# Patient Record
Sex: Female | Born: 1935 | Race: White | Hispanic: No | Smoking: Former smoker
Health system: Southern US, Community
[De-identification: ages and names within clinical notes are randomized; demographics above are authoritative.]

## PROBLEM LIST (undated history)

## (undated) DIAGNOSIS — I10 Essential (primary) hypertension: Secondary | ICD-10-CM

## (undated) DIAGNOSIS — E039 Hypothyroidism, unspecified: Secondary | ICD-10-CM

## (undated) DIAGNOSIS — M810 Age-related osteoporosis without current pathological fracture: Secondary | ICD-10-CM

## (undated) DIAGNOSIS — E7800 Pure hypercholesterolemia, unspecified: Secondary | ICD-10-CM

## (undated) HISTORY — DX: Pure hypercholesterolemia, unspecified: E78.00

## (undated) HISTORY — PX: APPENDECTOMY: SHX54

## (undated) HISTORY — DX: Hypothyroidism, unspecified: E03.9

## (undated) HISTORY — PX: VAGINAL HYSTERECTOMY: SUR661

## (undated) HISTORY — PX: CATARACT EXTRACTION: SUR2

## (undated) HISTORY — DX: Essential (primary) hypertension: I10

## (undated) HISTORY — PX: BACK SURGERY: SHX140

## (undated) HISTORY — DX: Age-related osteoporosis without current pathological fracture: M81.0

---

## 2003-05-07 ENCOUNTER — Ambulatory Visit (HOSPITAL_COMMUNITY): Admission: RE | Admit: 2003-05-07 | Discharge: 2003-05-08 | Payer: Self-pay | Admitting: Neurosurgery

## 2003-09-02 ENCOUNTER — Ambulatory Visit (HOSPITAL_COMMUNITY): Admission: RE | Admit: 2003-09-02 | Discharge: 2003-09-02 | Payer: Self-pay | Admitting: *Deleted

## 2016-10-09 ENCOUNTER — Telehealth: Payer: Self-pay

## 2016-10-09 NOTE — Telephone Encounter (Signed)
SENT NOTES TO SCHEDULING 

## 2016-10-14 NOTE — Progress Notes (Signed)
Cardiology Office Note   Date:  10/15/2016   ID:  Kristin Bryant, DOB [birth year], MRN [id number]  PCP:  Lawerance Cruel, MD  Cardiologist:   Minus Breeding, MD  Referring:  Lawerance Cruel, MD  Chief Complaint  Patient presents with  . Loss of Consciousness       History of Present Illness: Kristin Bryant is a 83 y.o. female who presents for evaluation of weakness and syncope.  The patient has had no prior cardiac history. She reports 2 or 3 weeks ago he was nauseated. She says she was given a medicine for "weight gain" and that this she thought might have caused those symptoms but I don't know what that medicine was. She's not taking it. Regardless she had some nausea and vomiting and when she was going into the bathroom she went down to [place] and had a frank syncopal episode while leaning over the toilet. She came to when she hit her head. She apparently did not seek medical attention. She otherwise has done okay. She is very active for her age. She works in her yard and drives and tends her house including vacuuming. She denies any neck or arm discomfort. She's not had any palpitations. There is some mild occasional orthostatic symptoms.  He denies any nausea vomiting other than the episode mentioned. He doesn't have any significant shortness of breath, PND or orthopnea. She does have lots of burping and gas that she associates with certain foods. She's had indigestion with this which has been a chronic issue. She seems to know which foods exacerbate this.   Past Medical History:  Diagnosis Date  . Elevated cholesterol   . Hypertension   . Hypothyroidism   . Osteoporosis     Past Surgical History:  Procedure Laterality Date  . APPENDECTOMY    . BACK SURGERY    . CATARACT EXTRACTION    . VAGINAL HYSTERECTOMY       Current Outpatient Prescriptions  Medication Sig Dispense Refill  . acetaminophen (TYLENOL) 650 MG CR tablet Take 650 mg by mouth every 8 (eight) hours as  needed for pain.    Marland Kitchen atorvastatin (LIPITOR) 40 MG tablet Take 40 mg by mouth daily.    . benazepril (LOTENSIN) 20 MG tablet Take 20 mg by mouth daily.    . Calcium Citrate (CITRACAL PO) Take 2 tablets by mouth daily.    . Cholecalciferol (VITAMIN D3) 5000 units CAPS Take 1 tablet by mouth daily.    . DiphenhydrAMINE HCl (BENADRYL ALLERGY PO) Take 1 tablet by mouth as directed.    . hydrocortisone 2.5 % cream Apply 1 application topically 2 (two) times daily.    Marland Kitchen levothyroxine (SYNTHROID, LEVOTHROID) 100 MCG tablet Take 100 mcg by mouth daily before breakfast.    . Melatonin 3 MG TABS Take 1 tablet by mouth at bedtime.    . Multiple Vitamins-Minerals (CENTRUM SILVER 50+WOMEN PO) Take 1 tablet by mouth daily.    . Multiple Vitamins-Minerals (PRESERVISION AREDS 2 PO) Take 1 capsule by mouth daily.    . Polyethylene Glycol 3350-GRX POWD Take by mouth. Mixed in liquid orally once a day as needed for constipation     No current facility-administered medications for this visit.     Allergies:   Arthrotec [diclofenac-misoprostol]; Aspirin; Benzonatate; Celexa [citalopram]; Guaifenesin & derivatives; Hydrocodone; Lily of the valley herb [convallariae majalis]; Mirtazapine; Naprosyn [naproxen]; Parafon forte dsc [chlorzoxazone]; Ranitidine; Sudafed [pseudoephedrine hcl]; Sulfa antibiotics; Epinephrine; and Latex  Social History:  The patient  reports that she has quit smoking. Her smoking use included Cigarettes. She has never used smokeless tobacco.   Family History:  The patient's family history includes Breast cancer in her sister; Diabetes in her son; Prostate cancer in her brother; Stomach cancer in her father.    ROS:  Please see the history of present illness.   Otherwise, review of systems are positive for tingling in her feet.   All other systems are reviewed and negative.    PHYSICAL EXAM: VS:  BP 134/60   Pulse 74   Ht 5\' 3"  (1.6 m)   Wt 137 lb 9.6 oz (62.4 kg)   BMI 24.37 kg/m   , BMI Body mass index is 24.37 kg/m. GENERAL:  Well appearing HEENT:  Pupils equal round and reactive, fundi not visualized, oral mucosa unremarkable NECK:  No jugular venous distention, waveform within normal limits, carotid upstroke brisk and symmetric, no bruits, no thyromegaly LYMPHATICS:  No cervical, inguinal adenopathy LUNGS:  Clear to auscultation bilaterally BACK:  No CVA tenderness CHEST:  Unremarkable HEART:  PMI not displaced or sustained,S1 and S2 within normal limits, no S3, no S4, no clicks, no rubs, no murmurs ABD:  Flat, positive bowel sounds normal in frequency in pitch, no bruits, no rebound, no guarding, no midline pulsatile mass, no hepatomegaly, no splenomegaly EXT:  2 plus pulses throughout, no edema, no cyanosis no clubbing SKIN:  No rashes no nodules NEURO:  Cranial nerves II through XII grossly intact, motor grossly intact throughout PSYCH:  Cognitively intact, oriented to person place and time    EKG:  EKG is ordered today. The ekg ordered today demonstrates sinus rhythm, rate 74, left axis deviation, low voltage in the limb and chest leads, could not exclude old inferior infarct   Recent Labs: No results found for requested labs within last 8760 hours.    Lipid Panel No results found for: CHOL, TRIG, HDL, CHOLHDL, VLDL, LDLCALC, LDLDIRECT    Wt Readings from Last 3 Encounters:  10/15/16 137 lb 9.6 oz (62.4 kg)      Other studies Reviewed: Additional studies/ records that were reviewed today include: Office records. Review of the above records demonstrates:  Please see elsewhere in the note.     ASSESSMENT AND PLAN:  FATIGUE:  She actually reports she thinks she feels fairly good for her age. She rests when she does work but she can complete her tasks without profound limits. She's had no acute change in this. At this point I did review some labs and she was not found to have an abnormal thyroid recently. I don't know her recent CBC. She does have  some sleep problems. However, I don't see a clear cardiac etiology to this complaint.  SYNCOPE:   This seems like it was related to the nausea vomiting episode she's had no recurrence of this. No further workup is planned.  CHEST PAIN:  She's had some chronic indigestion and has had no change to this pattern. She certainly can associate this clearly with certain foods.  I do not suspect an anginal etiology but would encourage first avoiding the foods and found her and possibly following with GI. I would certainly be happy to reevaluate this if there was not thought to be a GI cause.  ABNORMAL EKG:   She does have a slightly abnormal EKG with low voltage. However, there are no other acute findings and I don't see  an old EKG for comparison. I'll  try to obtain one.   Current medicines are reviewed at length with the patient today.  The patient does not have concerns regarding medicines.  The following changes have been made:  no change  Labs/ tests ordered today include: None No orders of the defined types were placed in this encounter.    Disposition:   FU with me as needed.     Signed, Minus Breeding, MD  10/15/2016 1:03 PM    [facility]

## 2016-10-15 ENCOUNTER — Ambulatory Visit (INDEPENDENT_AMBULATORY_CARE_PROVIDER_SITE_OTHER): Payer: Medicare Other | Admitting: Cardiology

## 2016-10-15 ENCOUNTER — Encounter (INDEPENDENT_AMBULATORY_CARE_PROVIDER_SITE_OTHER): Payer: Self-pay

## 2016-10-15 ENCOUNTER — Encounter: Payer: Self-pay | Admitting: Cardiology

## 2016-10-15 VITALS — BP 134/60 | HR 74 | Ht 63.0 in | Wt 137.6 lb

## 2016-10-15 DIAGNOSIS — R5383 Other fatigue: Secondary | ICD-10-CM | POA: Diagnosis not present

## 2016-10-15 DIAGNOSIS — R079 Chest pain, unspecified: Secondary | ICD-10-CM | POA: Insufficient documentation

## 2016-10-15 DIAGNOSIS — R55 Syncope and collapse: Secondary | ICD-10-CM

## 2016-10-15 DIAGNOSIS — R9431 Abnormal electrocardiogram [ECG] [EKG]: Secondary | ICD-10-CM

## 2016-10-15 NOTE — Patient Instructions (Signed)
Medication Instructions:  Continue current medications  Labwork: None Ordered  Testing/Procedures: None Ordered  Follow-Up: Your physician recommends that you schedule a follow-up appointment in: As Needed   Any Other Special Instructions Will Be Listed Below (If Applicable).   If you need a refill on your cardiac medications before your next appointment, please call your pharmacy.   

## 2016-10-16 NOTE — Addendum Note (Signed)
Addended by: Zebedee Iba on: 10/16/2016 11:40 AM   Modules accepted: Orders

## 2018-12-05 ENCOUNTER — Other Ambulatory Visit: Payer: Self-pay | Admitting: Family Medicine

## 2018-12-05 DIAGNOSIS — N6310 Unspecified lump in the right breast, unspecified quadrant: Secondary | ICD-10-CM

## 2018-12-09 DIAGNOSIS — C50919 Malignant neoplasm of unspecified site of unspecified female breast: Secondary | ICD-10-CM

## 2018-12-09 HISTORY — DX: Malignant neoplasm of unspecified site of unspecified female breast: C50.919

## 2018-12-11 ENCOUNTER — Other Ambulatory Visit: Payer: Medicare Other

## 2018-12-17 ENCOUNTER — Other Ambulatory Visit: Payer: Medicare Other

## 2019-01-19 ENCOUNTER — Telehealth: Payer: Self-pay | Admitting: Hematology and Oncology

## 2019-01-19 NOTE — Telephone Encounter (Signed)
Spoke with patient's dtr in law Wells Guiles [phone]) re new patient appointment with Dr. Lindi Adie. Date/time/[place] per navigator.

## 2019-01-20 NOTE — Telephone Encounter (Signed)
Per navigator rescheduled appointment from 10/23 to 10/15. Confirmed with patient son new date/time and also that he or wife attend appointment with patient per provider/navigator request. Per son wife will attend. Added comments to appointment note.

## 2019-01-21 NOTE — Progress Notes (Signed)
[facility] CONSULT NOTE  Patient Care Team: Lawerance Cruel, MD as PCP - General (Family Medicine)  CHIEF COMPLAINTS/PURPOSE OF CONSULTATION:  Newly diagnosed breast cancer  HISTORY OF PRESENTING ILLNESS:  Kristin Bryant 85 y.o. female is here because of recent diagnosis of invasive mammary carcinoma of the right breast. The patient palpated a lump in the right breast and noticed skin dimpling. Diagnostic mammogram on 12/25/18 showed a 3cm distortion in the right breast at the 7 o'clock position. Right breast biopsy on 01/07/19 showed invasive mammary carcinoma, grade 1, HER-2 negative (1+), ER+ 90%, PR- 0%, Ki67 20%. She presents to the clinic today for initial evaluation and discussion of treatment options.   I reviewed her records extensively and collaborated the history with the patient.  SUMMARY OF ONCOLOGIC HISTORY: Oncology History  Malignant neoplasm of lower-outer quadrant of right breast of female, estrogen receptor positive ([place])  01/07/2019 Initial Diagnosis   patient palpated a lump in the right breast and noticed skin dimpling. Diagnostic mammogram on 12/25/18 showed a 3cm distortion in the right breast at the 7 o'clock position. Right breast biopsy on 01/07/19 showed invasive mammary carcinoma, grade 1, HER-2 negative (1+), ER+ 90%, PR- 0%, Ki67 20%   01/22/2019 Cancer Staging   Staging form: Breast, AJCC 8th Edition - Clinical: Stage IIA (cT2, cN0, cM0, G1, ER+, PR-, HER2-) - Signed by Nicholas Lose, MD on 01/22/2019     MEDICAL HISTORY:  Past Medical History:  Diagnosis Date  . Elevated cholesterol   . Hypertension   . Hypothyroidism   . Osteoporosis     SURGICAL HISTORY: Appendectomy, oral surgery SOCIAL HISTORY: Moderate alcohol use, denies any current tobacco use.  Former smoker. FAMILY HISTORY: Family History  Problem Relation Age of Onset  . Stomach cancer Father   . Breast cancer Sister   . Prostate cancer Brother   . Diabetes Son      ALLERGIES:  is allergic to arthrotec [diclofenac-misoprostol]; aspirin; benzonatate; celexa [citalopram]; guaifenesin & derivatives; hydrocodone; lily of the valley herb [convallariae majalis]; mirtazapine; naprosyn [naproxen]; parafon forte dsc [chlorzoxazone]; ranitidine; sudafed [pseudoephedrine hcl]; sulfa antibiotics; epinephrine; and latex.  MEDICATIONS:  Current Outpatient Medications  Medication Sig Dispense Refill  . acetaminophen (TYLENOL) 650 MG CR tablet Take 650 mg by mouth every 8 (eight) hours as needed for pain.    Marland Kitchen anastrozole (ARIMIDEX) 1 MG tablet Take 1 tablet (1 mg total) by mouth daily. 90 tablet 3  . atorvastatin (LIPITOR) 40 MG tablet Take 40 mg by mouth daily.    . benazepril (LOTENSIN) 20 MG tablet Take 20 mg by mouth daily.    . Calcium Citrate (CITRACAL PO) Take 2 tablets by mouth daily.    . Cholecalciferol (VITAMIN D3) 5000 units CAPS Take 1 tablet by mouth daily.    . DiphenhydrAMINE HCl (BENADRYL ALLERGY PO) Take 1 tablet by mouth as directed.    Marland Kitchen levothyroxine (SYNTHROID, LEVOTHROID) 100 MCG tablet Take 100 mcg by mouth daily before breakfast.    . Multiple Vitamins-Minerals (CENTRUM SILVER 50+WOMEN PO) Take 1 tablet by mouth daily.    . Multiple Vitamins-Minerals (PRESERVISION AREDS 2 PO) Take 1 capsule by mouth daily.    . Polyethylene Glycol 3350-GRX POWD Take by mouth. Mixed in liquid orally once a day as needed for constipation     No current facility-administered medications for this visit.     REVIEW OF SYSTEMS:   Constitutional: Denies fevers, chills or abnormal night sweats Eyes: Denies blurriness of  vision, double vision or watery eyes Ears, nose, mouth, throat, and face: Denies mucositis or sore throat Respiratory: Denies cough, dyspnea or wheezes Cardiovascular: Denies palpitation, chest discomfort or lower extremity swelling Gastrointestinal:  Denies nausea, heartburn or change in bowel habits Skin: Denies abnormal skin rashes Lymphatics:  Denies new lymphadenopathy or easy bruising Neurological:Denies numbness, tingling or new weaknesses Behavioral/Psych: Mood is stable, no new changes  Breast: Right breast mass and skin dimpling All other systems were reviewed with the patient and are negative.  PHYSICAL EXAMINATION: ECOG PERFORMANCE STATUS: 1 - Symptomatic but completely ambulatory  Vitals:   01/22/19 1614  BP: (!) 143/67  Pulse: 85  Resp: 17  Temp: 98.5 F (36.9 C)  SpO2: 99%   Filed Weights   01/22/19 1614  Weight: 136 lb 6.4 oz (61.9 kg)    GENERAL:alert, no distress and comfortable SKIN: skin color, texture, turgor are normal, no rashes or significant lesions EYES: normal, conjunctiva are pink and non-injected, sclera clear OROPHARYNX:no exudate, no erythema and lips, buccal mucosa, and tongue normal  NECK: supple, thyroid normal size, non-tender, without nodularity LYMPH:  no palpable lymphadenopathy in the cervical, axillary or inguinal LUNGS: clear to auscultation and percussion with normal breathing effort HEART: regular rate & rhythm and no murmurs and no lower extremity edema ABDOMEN:abdomen soft, non-tender and normal bowel sounds Musculoskeletal:no cyanosis of digits and no clubbing  PSYCH: alert & oriented x 3 with fluent speech NEURO: no focal motor/sensory deficits BREAST: Large right breast palpable lump. No palpable axillary or supraclavicular lymphadenopathy (exam performed in the presence of a chaperone)   RADIOGRAPHIC STUDIES: I have personally reviewed the radiological reports and agreed with the findings in the report.  ASSESSMENT AND PLAN:  Malignant neoplasm of lower-outer quadrant of right breast of female, estrogen receptor positive ([place]) 01/07/2019:patient palpated a lump in the right breast and noticed skin dimpling. Diagnostic mammogram on 12/25/18 showed a 3cm distortion in the right breast at the 7 o'clock position. Right breast biopsy on 01/07/19 showed invasive mammary  carcinoma, grade 1, HER-2 negative (1+), ER+ 90%, PR- 0%, Ki67 20% T2N0 stage IIa clinical stage  Pathology and radiology counseling: Discussed with the patient, the details of pathology including the type of breast cancer,the clinical staging, the significance of ER, PR and HER-2/neu receptors and the implications for treatment. After reviewing the pathology in detail, we proceeded to discuss the different treatment options between surgery, and antiestrogen therapies.  Recommendation: 1.  Patient met with Dr. Donne Hazel who recommended conservative management with antiestrogen therapy. 2.  I counseled her about pros and cons of antiestrogen therapy with anastrozole.  We discussed the risks and benefits of anti-estrogen therapy with aromatase inhibitors. These include but not limited to insomnia, hot flashes, mood changes, vaginal dryness, bone density loss, and weight gain. We strongly believe that the benefits far outweigh the risks. Patient understands these risks and consented to starting treatment.   Patient understands of the goal of treatment is palliation.  It is not going to cure her cancer.  She will get mammograms every 6 months.  If it starts to grow then surgery can be an option. Patient is extremely physically agile and independent and still cares for herself and lives alone. Return to clinic in 3 months for toxicity evaluation follow-up.   All questions were answered. The patient knows to call the clinic with any problems, questions or concerns.   Rulon Eisenmenger, MD, MPH 01/22/2019    I, Molly Dorshimer, am acting as  scribe for Nicholas Lose, MD.  I have reviewed the above documentation for accuracy and completeness, and I agree with the above.

## 2019-01-22 ENCOUNTER — Inpatient Hospital Stay: Payer: Medicare Other | Attending: Hematology and Oncology | Admitting: Hematology and Oncology

## 2019-01-22 ENCOUNTER — Other Ambulatory Visit: Payer: Self-pay

## 2019-01-22 DIAGNOSIS — Z800 Family history of malignant neoplasm of digestive organs: Secondary | ICD-10-CM | POA: Diagnosis not present

## 2019-01-22 DIAGNOSIS — Z803 Family history of malignant neoplasm of breast: Secondary | ICD-10-CM | POA: Insufficient documentation

## 2019-01-22 DIAGNOSIS — Z170 Estrogen receptor positive status [ER+]: Secondary | ICD-10-CM | POA: Diagnosis not present

## 2019-01-22 DIAGNOSIS — I10 Essential (primary) hypertension: Secondary | ICD-10-CM | POA: Diagnosis not present

## 2019-01-22 DIAGNOSIS — E039 Hypothyroidism, unspecified: Secondary | ICD-10-CM | POA: Insufficient documentation

## 2019-01-22 DIAGNOSIS — Z87891 Personal history of nicotine dependence: Secondary | ICD-10-CM | POA: Insufficient documentation

## 2019-01-22 DIAGNOSIS — E7800 Pure hypercholesterolemia, unspecified: Secondary | ICD-10-CM | POA: Insufficient documentation

## 2019-01-22 DIAGNOSIS — C50511 Malignant neoplasm of lower-outer quadrant of right female breast: Secondary | ICD-10-CM | POA: Diagnosis not present

## 2019-01-22 DIAGNOSIS — Z79899 Other long term (current) drug therapy: Secondary | ICD-10-CM | POA: Insufficient documentation

## 2019-01-22 DIAGNOSIS — Z8042 Family history of malignant neoplasm of prostate: Secondary | ICD-10-CM | POA: Insufficient documentation

## 2019-01-22 MED ORDER — ANASTROZOLE 1 MG PO TABS: 1.0000 mg | ORAL_TABLET | Freq: Every day | ORAL | 3 refills | Status: DC

## 2019-01-22 NOTE — Assessment & Plan Note (Addendum)
01/07/2019:patient palpated a lump in the right breast and noticed skin dimpling. Diagnostic mammogram on 12/25/18 showed a 3cm distortion in the right breast at the 7 o'clock position. Right breast biopsy on 01/07/19 showed invasive mammary carcinoma, grade 1, HER-2 negative (1+), ER+ 90%, PR- 0%, Ki67 20% T2N0 stage IIa clinical stage  Pathology and radiology counseling: Discussed with the patient, the details of pathology including the type of breast cancer,the clinical staging, the significance of ER, PR and HER-2/neu receptors and the implications for treatment. After reviewing the pathology in detail, we proceeded to discuss the different treatment options between surgery, and antiestrogen therapies.  Recommendation: 1.  Patient met with Dr. Donne Hazel who recommended conservative management with antiestrogen therapy. 2.  I counseled her about pros and cons of antiestrogen therapy with anastrozole.  We discussed the risks and benefits of anti-estrogen therapy with aromatase inhibitors. These include but not limited to insomnia, hot flashes, mood changes, vaginal dryness, bone density loss, and weight gain. We strongly believe that the benefits far outweigh the risks. Patient understands these risks and consented to starting treatment.   Return to clinic in 3 months for toxicity evaluation follow-up.

## 2019-01-23 ENCOUNTER — Telehealth: Payer: Self-pay | Admitting: Hematology and Oncology

## 2019-01-23 NOTE — Telephone Encounter (Signed)
I left a message regarding schedule  

## 2019-01-30 ENCOUNTER — Ambulatory Visit: Payer: Medicare Other | Admitting: Hematology and Oncology

## 2019-04-23 NOTE — Progress Notes (Signed)
Patient Care Team: Lawerance Cruel, MD as PCP - General (Family Medicine)  DIAGNOSIS:    ICD-10-CM   1. Malignant neoplasm of lower-outer quadrant of right breast of female, estrogen receptor positive ([place])  C50.511 US BREAST LTD UNI RIGHT INC AXILLA   Z17.0 MM DIAG BREAST TOMO UNI RIGHT    SUMMARY OF ONCOLOGIC HISTORY: Oncology History  Malignant neoplasm of lower-outer quadrant of right breast of female, estrogen receptor positive ([place])  01/07/2019 Initial Diagnosis   patient palpated a lump in the right breast and noticed skin dimpling. Diagnostic mammogram on 12/25/18 showed a 3cm distortion in the right breast at the 7 o'clock position. Right breast biopsy on 01/07/19 showed invasive mammary carcinoma, grade 1, HER-2 negative (1+), ER+ 90%, PR- 0%, Ki67 20%   01/22/2019 Cancer Staging   Staging form: Breast, AJCC 8th Edition - Clinical: Stage IIA (cT2, cN0, cM0, G1, ER+, PR-, HER2-) - Signed by Nicholas Lose, MD on 01/22/2019   01/22/2019 -  Neo-Adjuvant Anti-estrogen oral therapy   Anastrozole '1mg'$  daily     CHIEF COMPLIANT: Follow-up of right breast cancer on anastrozole  INTERVAL HISTORY: Kristin Bryant is a 86 y.o. with above-mentioned history of right breast cancer currently on neoadjuvant antiestrogen therapy with anastrozole. She presents to the clinic today for follow-up.   ALLERGIES:  is allergic to arthrotec [diclofenac-misoprostol]; aspirin; benzonatate; celexa [citalopram]; guaifenesin & derivatives; hydrocodone; lily of the valley herb [convallariae majalis]; mirtazapine; naprosyn [naproxen]; parafon forte dsc [chlorzoxazone]; ranitidine; sudafed [pseudoephedrine hcl]; sulfa antibiotics; epinephrine; and latex.  MEDICATIONS:  Current Outpatient Medications  Medication Sig Dispense Refill  . acetaminophen (TYLENOL) 650 MG CR tablet Take 650 mg by mouth every 8 (eight) hours as needed for pain.    Marland Kitchen anastrozole (ARIMIDEX) 1 MG tablet Take 1 tablet (1 mg total) by  mouth daily. 90 tablet 3  . atorvastatin (LIPITOR) 40 MG tablet Take 40 mg by mouth daily.    . benazepril (LOTENSIN) 20 MG tablet Take 20 mg by mouth daily.    . Calcium Citrate (CITRACAL PO) Take 2 tablets by mouth daily.    . Cholecalciferol (VITAMIN D3) 5000 units CAPS Take 1 tablet by mouth daily.    . DiphenhydrAMINE HCl (BENADRYL ALLERGY PO) Take 1 tablet by mouth as directed.    Marland Kitchen levothyroxine (SYNTHROID, LEVOTHROID) 100 MCG tablet Take 100 mcg by mouth daily before breakfast.    . Multiple Vitamins-Minerals (CENTRUM SILVER 50+WOMEN PO) Take 1 tablet by mouth daily.    . Multiple Vitamins-Minerals (PRESERVISION AREDS 2 PO) Take 1 capsule by mouth daily.    . Polyethylene Glycol 3350-GRX POWD Take by mouth. Mixed in liquid orally once a day as needed for constipation     No current facility-administered medications for this visit.    PHYSICAL EXAMINATION: ECOG PERFORMANCE STATUS: 1 - Symptomatic but completely ambulatory  Vitals:   04/24/19 1135  BP: (!) 160/68  Pulse: 68  Resp: 18  Temp: 98.3 F (36.8 C)  SpO2: 100%   Filed Weights   04/24/19 1135  Weight: 139 lb 11.2 oz (63.4 kg)   ASSESSMENT & PLAN:  Malignant neoplasm of lower-outer quadrant of right breast of female, estrogen receptor positive ([place]) 01/07/2019:patient palpated a lump in the right breast and noticed skin dimpling. Diagnostic mammogram on 12/25/18 showed a 3cm distortion in the right breast at the 7 o'clock position. Right breast biopsy on 01/07/19 showed invasive mammary carcinoma, grade 1, HER-2 negative (1+), ER+ 90%, PR- 0%, Ki67 20%  T2N0 stage IIa clinical stage  Current treatment: Palliative antiestrogen therapy with anastrozole 1 mg daily started 01/22/2019 Anastrozole toxicities: Tolerating it well  Plan is to obtain mammograms in 6 months Patient is extremely physically agile and independent and still cares for herself and lives alone.  Return to clinic in 6 months for follow-up after  mammograms    Orders Placed This Encounter  Procedures  . US BREAST LTD UNI RIGHT INC AXILLA    Standing Status:   Future    Standing Expiration Date:   06/21/2020    Order Specific Question:   Reason for Exam (SYMPTOM  OR DIAGNOSIS REQUIRED)    Answer:   Folow up of breast cancer on hormonal therapy    Order Specific Question:   Preferred imaging location?    Answer:   [facility]  . MM DIAG BREAST TOMO UNI RIGHT    Standing Status:   Future    Standing Expiration Date:   04/23/2020    Order Specific Question:   Reason for Exam (SYMPTOM  OR DIAGNOSIS REQUIRED)    Answer:   Follow up of breast cancer    Order Specific Question:   Preferred imaging location?    Answer:   [facility]   The patient has a good understanding of the overall plan. she agrees with it. she will call with any problems that may develop before the next visit here.  Total time spent: 15 mins including face to face time and time spent for planning, charting and coordination of care  Nicholas Lose, MD 04/24/2019  I, Cloyde Reams Dorshimer, am acting as scribe for Dr. Nicholas Lose.  I have reviewed the above documentation for accuracy and completeness, and I agree with the above.

## 2019-04-24 ENCOUNTER — Inpatient Hospital Stay: Payer: Medicare Other | Attending: Hematology and Oncology | Admitting: Hematology and Oncology

## 2019-04-24 ENCOUNTER — Other Ambulatory Visit: Payer: Self-pay

## 2019-04-24 DIAGNOSIS — Z79899 Other long term (current) drug therapy: Secondary | ICD-10-CM | POA: Insufficient documentation

## 2019-04-24 DIAGNOSIS — C50511 Malignant neoplasm of lower-outer quadrant of right female breast: Secondary | ICD-10-CM | POA: Diagnosis not present

## 2019-04-24 DIAGNOSIS — Z79811 Long term (current) use of aromatase inhibitors: Secondary | ICD-10-CM | POA: Insufficient documentation

## 2019-04-24 DIAGNOSIS — Z170 Estrogen receptor positive status [ER+]: Secondary | ICD-10-CM

## 2019-04-24 NOTE — Assessment & Plan Note (Signed)
01/07/2019:patient palpated a lump in the right breast and noticed skin dimpling. Diagnostic mammogram on 12/25/18 showed a 3cm distortion in the right breast at the 7 o'clock position. Right breast biopsy on 01/07/19 showed invasive mammary carcinoma, grade 1, HER-2 negative (1+), ER+ 90%, PR- 0%, Ki67 20% T2N0 stage IIa clinical stage  Current treatment: Palliative antiestrogen therapy with anastrozole 1 mg daily started 01/22/2019 Anastrozole toxicities:  Plan is to obtain mammograms annually. Patient is extremely physically agile and independent and still cares for herself and lives alone.  Return to clinic in 6 months for follow-up

## 2019-04-27 ENCOUNTER — Telehealth: Payer: Self-pay | Admitting: Hematology and Oncology

## 2019-04-27 NOTE — Telephone Encounter (Signed)
I could not reach patient regarding schedule  °

## 2019-05-24 ENCOUNTER — Ambulatory Visit: Payer: Medicare Other | Attending: Internal Medicine

## 2019-05-24 DIAGNOSIS — Z23 Encounter for immunization: Secondary | ICD-10-CM

## 2019-05-24 NOTE — Progress Notes (Signed)
   Covid-19 Vaccination Clinic  Name:  Kristin Bryant    MRN: [id number] DOB: 1922/05/19  05/24/2019  [name] was observed post Covid-19 immunization for 30 minutes based on pre-vaccination screening without incidence. She was provided with Vaccine Information Sheet and instruction to access the V-Safe system.   [name] was instructed to call 911 with any severe reactions post vaccine: Marland Kitchen Difficulty breathing  . Swelling of your face and throat  . A fast heartbeat  . A bad rash all over your body  . Dizziness and weakness    Immunizations Administered    Name Date Dose VIS Date Route   Pfizer COVID-19 Vaccine 05/24/2019  1:44 PM 0.3 mL 03/20/2019 Intramuscular   Manufacturer: [place]   Lot: [id number]   [place]: [id number]

## 2019-06-16 ENCOUNTER — Ambulatory Visit: Payer: Medicare Other | Attending: Internal Medicine

## 2019-06-16 DIAGNOSIS — Z23 Encounter for immunization: Secondary | ICD-10-CM | POA: Insufficient documentation

## 2019-06-16 NOTE — Progress Notes (Signed)
   Covid-19 Vaccination Clinic  Name:  Kristin Bryant    MRN: [id number] DOB: [birth year]  06/16/2019  [name] was observed post Covid-19 immunization for 15 minutes without incident. She was provided with Vaccine Information Sheet and instruction to access the V-Safe system.   [name] was instructed to call 911 with any severe reactions post vaccine: Marland Kitchen Difficulty breathing  . Swelling of face and throat  . A fast heartbeat  . A bad rash all over body  . Dizziness and weakness   Immunizations Administered    Name Date Dose VIS Date Route   Pfizer COVID-19 Vaccine 06/16/2019  2:00 PM 0.3 mL 03/20/2019 Intramuscular   Manufacturer: [place]   Lot: [id number]   [place]: [id number]

## 2019-06-17 ENCOUNTER — Ambulatory Visit: Payer: Medicare Other

## 2019-10-22 ENCOUNTER — Ambulatory Visit
Admission: RE | Admit: 2019-10-22 | Discharge: 2019-10-22 | Disposition: A | Discharge status: Home / Self Care | Payer: Medicare Other | Source: Ambulatory Visit | Attending: Hematology and Oncology | Admitting: Hematology and Oncology

## 2019-10-22 ENCOUNTER — Other Ambulatory Visit: Payer: Self-pay

## 2019-10-22 DIAGNOSIS — C50511 Malignant neoplasm of lower-outer quadrant of right female breast: Secondary | ICD-10-CM

## 2019-10-22 DIAGNOSIS — Z170 Estrogen receptor positive status [ER+]: Secondary | ICD-10-CM

## 2019-10-25 NOTE — Progress Notes (Signed)
Patient Care Team: Kristin Floro, MD as PCP - General (Family Medicine)  DIAGNOSIS:    ICD-10-CM   1. Malignant neoplasm of lower-outer quadrant of right breast of female, estrogen receptor positive (HCC)  C50.511    Z17.0     SUMMARY OF ONCOLOGIC HISTORY: Oncology History  Malignant neoplasm of lower-outer quadrant of right breast of female, estrogen receptor positive (HCC)  01/07/2019 Initial Diagnosis   patient palpated a lump in the right breast and noticed skin dimpling. Diagnostic mammogram on 12/25/18 showed a 3cm distortion in the right breast at the 7 o'clock position. Right breast biopsy on 01/07/19 showed invasive mammary carcinoma, grade 1, HER-2 negative (1+), ER+ 90%, PR- 0%, Ki67 20%   01/22/2019 Cancer Staging   Staging form: Breast, AJCC 8th Edition - Clinical: Stage IIA (cT2, cN0, cM0, G1, ER+, PR-, HER2-) - Signed by Serena Croissant, MD on 01/22/2019   01/22/2019 -  Neo-Adjuvant Anti-estrogen oral therapy   Anastrozole 1mg  daily     CHIEF COMPLIANT: Follow-up of right breast cancer on anastrozole  INTERVAL HISTORY: Kristin Bryant is a 86 y.o. with above-mentioned history of right breast cancer currently on neoadjuvant antiestrogen therapy with anastrozole. Mammogram and 97 on 10/22/19 showed decreased size of right breast malignancies from 2.4cm to 1.8cm and from 1.2cm to 0.4cm, and no new findings. She presents to the clinic today for follow-up.   ALLERGIES:  is allergic to arthrotec [diclofenac-misoprostol], aspirin, benzonatate, celexa [citalopram], guaifenesin & derivatives, hydrocodone, lily of the valley herb [convallariae majalis], mirtazapine, naprosyn [naproxen], parafon forte dsc [chlorzoxazone], ranitidine, sudafed [pseudoephedrine hcl], sulfa antibiotics, epinephrine, and latex.  MEDICATIONS:  Current Outpatient Medications  Medication Sig Dispense Refill  . acetaminophen (TYLENOL) 650 MG CR tablet Take 650 mg by mouth every 8 (eight) hours as needed  for pain.    10/24/19 anastrozole (ARIMIDEX) 1 MG tablet Take 1 tablet (1 mg total) by mouth daily. 90 tablet 3  . atorvastatin (LIPITOR) 40 MG tablet Take 40 mg by mouth daily.    . benazepril (LOTENSIN) 20 MG tablet Take 20 mg by mouth daily.    . Calcium Citrate (CITRACAL PO) Take 2 tablets by mouth daily.    . Cholecalciferol (VITAMIN D3) 5000 units CAPS Take 1 tablet by mouth daily.    . DiphenhydrAMINE HCl (BENADRYL ALLERGY PO) Take 1 tablet by mouth as directed.    Marland Kitchen levothyroxine (SYNTHROID, LEVOTHROID) 100 MCG tablet Take 100 mcg by mouth daily before breakfast.    . Multiple Vitamins-Minerals (CENTRUM SILVER 50+WOMEN PO) Take 1 tablet by mouth daily.    . Multiple Vitamins-Minerals (PRESERVISION AREDS 2 PO) Take 1 capsule by mouth daily.    . Polyethylene Glycol 3350-GRX POWD Take by mouth. Mixed in liquid orally once a day as needed for constipation     No current facility-administered medications for this visit.    PHYSICAL EXAMINATION: ECOG PERFORMANCE STATUS: 1 - Symptomatic but completely ambulatory  Vitals:   10/26/19 1016  BP: (!) 147/66  Pulse: 71  Resp: 20  Temp: 98.9 F (37.2 C)  SpO2: 100%   Filed Weights   10/26/19 1016  Weight: 138 lb 14.4 oz (63 kg)    BREAST: No palpable masses or nodules in either right or left breasts. No palpable axillary supraclavicular or infraclavicular adenopathy no breast tenderness or nipple discharge. (exam performed in the presence of a chaperone)  LABORATORY DATA:  I have reviewed the data as listed No flowsheet data found.  No results found  for: WBC, HGB, HCT, MCV, PLT, NEUTROABS  ASSESSMENT & PLAN:  Malignant neoplasm of lower-outer quadrant of right breast of female, estrogen receptor positive ([place]) 01/07/2019:patient palpated a lump in the right breast and noticed skin dimpling. Diagnostic mammogram on 12/25/18 showed a 3cm distortion in the right breast at the 7 o'clock position. Right breast biopsy on 01/07/19 showed invasive  mammary carcinoma, grade 1, HER-2 negative (1+), ER+ 90%, PR- 0%, Ki67 20% T2N0 stage IIa clinical stage  Current treatment: Palliative antiestrogen therapy with anastrozole 1 mg daily started 01/22/2019 Anastrozole toxicities: Tolerating it well  Breast cancer surveillance: 1.  Breast exam 10/26/2019: Benign 2. mammogram and ultrasound 10/22/2019: Decrease size of the known malignancies in the right breast from 2.4 cm to 1.8 cm, 1.2 cm to 0.4 cm. Given the excellent response to antiestrogen therapy will continue with the same plan.  Patient is extremely physically agile and independent and still cares for herself and lives alone.  Return to clinic in 6 months for follow-up.  We can do mammograms less often in the future probably once a year.    No orders of the defined types were placed in this encounter.  The patient has a good understanding of the overall plan. she agrees with it. she will call with any problems that may develop before the next visit here.  Total time spent: 20 mins including face to face time and time spent for planning, charting and coordination of care  Nicholas Lose, MD 10/26/2019  I, Cloyde Reams Dorshimer, am acting as scribe for Dr. Nicholas Lose.  I have reviewed the above documentation for accuracy and completeness, and I agree with the above.

## 2019-10-26 ENCOUNTER — Inpatient Hospital Stay: Payer: Medicare Other | Attending: Hematology and Oncology | Admitting: Hematology and Oncology

## 2019-10-26 ENCOUNTER — Other Ambulatory Visit: Payer: Self-pay

## 2019-10-26 DIAGNOSIS — Z170 Estrogen receptor positive status [ER+]: Secondary | ICD-10-CM | POA: Insufficient documentation

## 2019-10-26 DIAGNOSIS — Z79899 Other long term (current) drug therapy: Secondary | ICD-10-CM | POA: Diagnosis not present

## 2019-10-26 DIAGNOSIS — Z79811 Long term (current) use of aromatase inhibitors: Secondary | ICD-10-CM | POA: Diagnosis not present

## 2019-10-26 DIAGNOSIS — C50511 Malignant neoplasm of lower-outer quadrant of right female breast: Secondary | ICD-10-CM | POA: Diagnosis present

## 2019-10-26 MED ORDER — ANASTROZOLE 1 MG PO TABS: 1.0000 mg | ORAL_TABLET | Freq: Every day | ORAL | 3 refills | Status: DC

## 2019-10-26 NOTE — Assessment & Plan Note (Signed)
01/07/2019:patient palpated a lump in the right breast and noticed skin dimpling. Diagnostic mammogram on 12/25/18 showed a 3cm distortion in the right breast at the 7 o'clock position. Right breast biopsy on 01/07/19 showed invasive mammary carcinoma, grade 1, HER-2 negative (1+), ER+ 90%, PR- 0%, Ki67 20% T2N0 stage IIa clinical stage  Current treatment: Palliative antiestrogen therapy with anastrozole 1 mg daily started 01/22/2019 Anastrozole toxicities: Tolerating it well  Breast cancer surveillance: 1.  Breast exam 10/26/2019: Benign 2. mammogram and ultrasound 10/22/2019: Decrease size of the known malignancies in the right breast from 2.4 cm to 1.8 cm, 1.2 cm to 0.4 cm. Given the excellent response to antiestrogen therapy will continue with the same plan.  Patient is extremely physically agile and independent and still cares for herself and lives alone.  Return to clinic in 6 months for follow-up.  We can do mammograms less often in the future probably once a year.

## 2020-01-11 ENCOUNTER — Telehealth: Payer: Self-pay | Admitting: *Deleted

## 2020-01-11 NOTE — Telephone Encounter (Signed)
Received VM from [employer] on behalf of pt.  Attempt x1 to return call.  No answer, LVM to return call to the office.

## 2020-04-28 NOTE — Progress Notes (Signed)
HEMATOLOGY-ONCOLOGY TELEPHONE VISIT PROGRESS NOTE  I connected with $RemoveBefore'@PTNAME'aWvCCJqhMDmvW$ @ on 04/29/20 at 11:45 AM EST by telephone and verified that I am speaking with the correct person using two identifiers.  I discussed the limitations, risks, security and privacy concerns of performing an evaluation and management service by telephone and the availability of in person appointments.  I also discussed with the patient that there may be a patient responsible charge related to this service. The patient expressed understanding and agreed to proceed.   History of Present Illness:  Kristin Bryant is a 87 y.o. with above-mentioned history of right breast cancer currently on neoadjuvant antiestrogen therapy with anastrozole. She presents to the clinic todayfor follow-up.  Oncology History  Malignant neoplasm of lower-outer quadrant of right breast of female, estrogen receptor positive ([place])  01/07/2019 Initial Diagnosis   patient palpated a lump in the right breast and noticed skin dimpling. Diagnostic mammogram on 12/25/18 showed a 3cm distortion in the right breast at the 7 o'clock position. Right breast biopsy on 01/07/19 showed invasive mammary carcinoma, grade 1, HER-2 negative (1+), ER+ 90%, PR- 0%, Ki67 20%   01/22/2019 Cancer Staging   Staging form: Breast, AJCC 8th Edition - Clinical: Stage IIA (cT2, cN0, cM0, G1, ER+, PR-, HER2-) - Signed by Nicholas Lose, MD on 01/22/2019   01/22/2019 -  Neo-Adjuvant Anti-estrogen oral therapy   Anastrozole $RemoveBefo'1mg'RFvAzTJScOH$  daily     REVIEW OF SYSTEMS:   Constitutional: Denies fevers, chills or abnormal weight loss Occasional hot flashes All other systems were reviewed with the patient and are negative. Observations/Objective:     Assessment Plan:  Malignant neoplasm of lower-outer quadrant of right breast of female, estrogen receptor positive ([place]) 01/07/2019:patient palpated a lump in the right breast and noticed skin dimpling. Diagnostic mammogram on 12/25/18 showed a 3cm  distortion in the right breast at the 7 o'clock position. Right breast biopsy on 01/07/19 showed invasive mammary carcinoma, grade 1, HER-2 negative (1+), ER+ 90%, PR- 0%, Ki67 20% T2N0 stage IIa clinical stage  Current treatment: Palliative antiestrogen therapy with anastrozole 1 mg daily started 01/22/2019 Anastrozole toxicities:Tolerating it well Occasional hot flashes but tolerating it well. She does have a history of arthritis symptoms.  Breast cancer surveillance: 1.  Breast exam 10/26/2019: Benign 2. mammogram scheduled for 05/13/2020    I discussed the assessment and treatment plan with the patient. The patient was provided an opportunity to ask questions and all were answered. The patient agreed with the plan and demonstrated an understanding of the instructions. The patient was advised to call back or seek an in-person evaluation if the symptoms worsen or if the condition fails to improve as anticipated.   I provided 12 minutes of non-face-to-face time during this encounter. Harriette Ohara, MD

## 2020-04-29 ENCOUNTER — Inpatient Hospital Stay: Payer: Medicare Other | Attending: Hematology and Oncology | Admitting: Hematology and Oncology

## 2020-04-29 DIAGNOSIS — C50511 Malignant neoplasm of lower-outer quadrant of right female breast: Secondary | ICD-10-CM

## 2020-04-29 DIAGNOSIS — Z170 Estrogen receptor positive status [ER+]: Secondary | ICD-10-CM | POA: Diagnosis not present

## 2020-04-29 MED ORDER — ANASTROZOLE 1 MG PO TABS: 1.0000 mg | ORAL_TABLET | Freq: Every day | ORAL | 3 refills | Status: DC

## 2020-04-29 NOTE — Assessment & Plan Note (Signed)
01/07/2019:patient palpated a lump in the right breast and noticed skin dimpling. Diagnostic mammogram on 12/25/18 showed a 3cm distortion in the right breast at the 7 o'clock position. Right breast biopsy on 01/07/19 showed invasive mammary carcinoma, grade 1, HER-2 negative (1+), ER+ 90%, PR- 0%, Ki67 20% T2N0 stage IIa clinical stage  Current treatment: Palliative antiestrogen therapy with anastrozole 1 mg daily started 01/22/2019 Anastrozole toxicities:Tolerating it well  Breast cancer surveillance: 1.  Breast exam 10/26/2019: Benign 2. mammogram scheduled for 05/13/2020

## 2020-05-09 DIAGNOSIS — H353211 Exudative age-related macular degeneration, right eye, with active choroidal neovascularization: Secondary | ICD-10-CM | POA: Diagnosis not present

## 2020-05-13 ENCOUNTER — Ambulatory Visit
Admission: RE | Admit: 2020-05-13 | Discharge: 2020-05-13 | Disposition: A | Discharge status: Home / Self Care | Payer: Medicare Other | Source: Ambulatory Visit | Attending: Hematology and Oncology | Admitting: Hematology and Oncology

## 2020-05-13 ENCOUNTER — Other Ambulatory Visit: Payer: Self-pay

## 2020-05-13 ENCOUNTER — Other Ambulatory Visit: Payer: Self-pay | Admitting: Hematology and Oncology

## 2020-05-13 DIAGNOSIS — R922 Inconclusive mammogram: Secondary | ICD-10-CM | POA: Diagnosis not present

## 2020-05-13 DIAGNOSIS — C50511 Malignant neoplasm of lower-outer quadrant of right female breast: Secondary | ICD-10-CM

## 2020-05-13 DIAGNOSIS — N6314 Unspecified lump in the right breast, lower inner quadrant: Secondary | ICD-10-CM | POA: Diagnosis not present

## 2020-05-13 DIAGNOSIS — N6313 Unspecified lump in the right breast, lower outer quadrant: Secondary | ICD-10-CM | POA: Diagnosis not present

## 2020-05-16 DIAGNOSIS — H353221 Exudative age-related macular degeneration, left eye, with active choroidal neovascularization: Secondary | ICD-10-CM | POA: Diagnosis not present

## 2020-06-20 DIAGNOSIS — H353211 Exudative age-related macular degeneration, right eye, with active choroidal neovascularization: Secondary | ICD-10-CM | POA: Diagnosis not present

## 2020-07-04 DIAGNOSIS — H353221 Exudative age-related macular degeneration, left eye, with active choroidal neovascularization: Secondary | ICD-10-CM | POA: Diagnosis not present

## 2020-07-29 ENCOUNTER — Emergency Department (HOSPITAL_BASED_OUTPATIENT_CLINIC_OR_DEPARTMENT_OTHER)
Admission: EM | Admit: 2020-07-29 | Discharge: 2020-07-29 | Disposition: A | Discharge status: Home / Self Care | Payer: Medicare Other | Attending: Emergency Medicine | Admitting: Emergency Medicine

## 2020-07-29 ENCOUNTER — Encounter (HOSPITAL_BASED_OUTPATIENT_CLINIC_OR_DEPARTMENT_OTHER): Payer: Self-pay

## 2020-07-29 ENCOUNTER — Other Ambulatory Visit: Payer: Self-pay

## 2020-07-29 DIAGNOSIS — F0390 Unspecified dementia without behavioral disturbance: Secondary | ICD-10-CM | POA: Insufficient documentation

## 2020-07-29 DIAGNOSIS — K5641 Fecal impaction: Secondary | ICD-10-CM | POA: Insufficient documentation

## 2020-07-29 DIAGNOSIS — Z79899 Other long term (current) drug therapy: Secondary | ICD-10-CM | POA: Insufficient documentation

## 2020-07-29 DIAGNOSIS — K6289 Other specified diseases of anus and rectum: Secondary | ICD-10-CM | POA: Diagnosis present

## 2020-07-29 DIAGNOSIS — E039 Hypothyroidism, unspecified: Secondary | ICD-10-CM | POA: Insufficient documentation

## 2020-07-29 DIAGNOSIS — Z91040 Latex allergy status: Secondary | ICD-10-CM | POA: Diagnosis not present

## 2020-07-29 DIAGNOSIS — I10 Essential (primary) hypertension: Secondary | ICD-10-CM | POA: Insufficient documentation

## 2020-07-29 DIAGNOSIS — Z853 Personal history of malignant neoplasm of breast: Secondary | ICD-10-CM | POA: Diagnosis not present

## 2020-07-29 DIAGNOSIS — Z87891 Personal history of nicotine dependence: Secondary | ICD-10-CM | POA: Insufficient documentation

## 2020-07-29 MED ORDER — FENTANYL CITRATE (PF) 100 MCG/2ML IJ SOLN
50.0000 ug | Freq: Once | INTRAMUSCULAR | Status: AC
  Administered 2020-07-29: 50 ug via INTRAVENOUS
  Filled 2020-07-29: qty 2

## 2020-07-29 MED ORDER — POLYETHYLENE GLYCOL 3350 17 GM/SCOOP PO POWD: ORAL | 0 refills | Status: DC

## 2020-07-29 MED ORDER — DOCUSATE SODIUM 100 MG PO CAPS: 100.0000 mg | ORAL_CAPSULE | Freq: Two times a day (BID) | ORAL | 0 refills | Status: DC

## 2020-07-29 MED ORDER — FLEET ENEMA 7-19 GM/118ML RE ENEM
1.0000 | ENEMA | Freq: Once | RECTAL | Status: AC
  Administered 2020-07-29: 1 via RECTAL
  Filled 2020-07-29: qty 1

## 2020-07-29 NOTE — ED Triage Notes (Signed)
Constipation, rectal pain x 3 days. Patient seen at urgent care and told to come here so she can have some pain medication for disimpaction.

## 2020-07-29 NOTE — ED Notes (Signed)
Pt had a very large bowel movement. MD notified.

## 2020-07-29 NOTE — ED Provider Notes (Signed)
[facility] EMERGENCY DEPT Provider Note   CSN: [id number] Arrival date & time: 07/29/20  1819     History Chief Complaint  Patient presents with  . Fecal Impaction    Kristin Bryant is a 87 y.o. female.  87 year old female with past medical history below who presents with constipation and rectal pain.  Patient has a longstanding history of constipation and daughter states that she has been resistant to taking medications for it.  She takes Metamucil but has usually refused any other medications.  Daughter notes that she is stubborn and has underlying dementia which also makes things difficult.  She has not had a bowel movement in the past 3 days and has had progressively worsening rectal pain and feeling like she needs to have a bowel movement but unable to pass stool.  She has had some liquid stool leaking in her underwear. She went to UC today and was referred here for disimpaction. No vomiting or fevers.  The history is provided by the patient and a relative.       Past Medical History:  Diagnosis Date  . Breast cancer ([place]) 12/2018   right  . Elevated cholesterol   . Hypertension   . Hypothyroidism   . Osteoporosis     Patient Active Problem List   Diagnosis Date Noted  . Malignant neoplasm of lower-outer quadrant of right breast of female, estrogen receptor positive ([place]) 01/22/2019  . Syncope 10/15/2016  . Fatigue 10/15/2016  . Abnormal EKG 10/15/2016  . Chest pain 10/15/2016    Past Surgical History:  Procedure Laterality Date  . APPENDECTOMY    . BACK SURGERY    . CATARACT EXTRACTION    . VAGINAL HYSTERECTOMY       OB History   No obstetric history on file.     Family History  Problem Relation Age of Onset  . Stomach cancer Father   . Breast cancer Sister   . Prostate cancer Brother   . Diabetes Son     Social History   Tobacco Use  . Smoking status: Former Smoker    Types: Cigarettes  . Smokeless tobacco: Never Used    Home  Medications Prior to Admission medications   Medication Sig Start Date End Date Taking? Authorizing Provider  docusate sodium (COLACE) 100 MG capsule Take 1 capsule (100 mg total) by mouth every 12 (twelve) hours. 07/29/20  Yes Skiler Olden, Wenda Overland, MD  polyethylene glycol powder (GLYCOLAX/MIRALAX) 17 GM/SCOOP powder Mix 1 capful in a drink and take by mouth 1-3 times daily until daily soft stools  OTC 07/29/20  Yes Cory Kitt, Wenda Overland, MD  acetaminophen (TYLENOL) 650 MG CR tablet Take 650 mg by mouth every 8 (eight) hours as needed for pain.    [provider]  anastrozole (ARIMIDEX) 1 MG tablet Take 1 tablet (1 mg total) by mouth daily. 04/29/20   Nicholas Lose, MD  atorvastatin (LIPITOR) 40 MG tablet Take 40 mg by mouth daily.    [provider]  benazepril (LOTENSIN) 20 MG tablet Take 20 mg by mouth daily.    [provider]  Calcium Citrate (CITRACAL PO) Take 2 tablets by mouth daily.    [provider]  Cholecalciferol (VITAMIN D3) 5000 units CAPS Take 1 tablet by mouth daily.    [provider]  DiphenhydrAMINE HCl (BENADRYL ALLERGY PO) Take 1 tablet by mouth as directed.    [provider]  levothyroxine (SYNTHROID, LEVOTHROID) 100 MCG tablet Take 100 mcg by  mouth daily before breakfast.    [provider]  Multiple Vitamins-Minerals (CENTRUM SILVER 50+WOMEN PO) Take 1 tablet by mouth daily.    [provider]  Multiple Vitamins-Minerals (PRESERVISION AREDS 2 PO) Take 1 capsule by mouth daily.    [provider]    Allergies    Arthrotec [diclofenac-misoprostol], Aspirin, Benzonatate, Celexa [citalopram], Guaifenesin & derivatives, Hydrocodone, Lily of the valley herb [convallariae majalis], Mirtazapine, Naprosyn [naproxen], Parafon forte dsc [chlorzoxazone], Ranitidine, Sudafed [pseudoephedrine hcl], Sulfa antibiotics, Epinephrine, and Latex  Review of Systems   Review of Systems  Unable to perform ROS:  Dementia    Physical Exam Updated Vital Signs BP (!) 131/113   Pulse 88   Temp 98.6 F (37 C) (Oral)   Resp 16   Ht 5\' 3"  (1.6 m)   Wt 59.4 kg   SpO2 94%   BMI 23.21 kg/m   Physical Exam Vitals and nursing note reviewed. Exam conducted with a chaperone present.  Constitutional:      General: She is not in acute distress.    Appearance: She is well-developed.     Comments: Uncomfortable, tearful, laying on side  HENT:     Head: Normocephalic and atraumatic.  Eyes:     Conjunctiva/sclera: Conjunctivae normal.  Genitourinary:    Comments: Large amount of hard stool in rectal vault Musculoskeletal:     Cervical back: Neck supple.  Skin:    General: Skin is warm and dry.  Neurological:     Mental Status: She is alert and oriented to person, place, and time.  Psychiatric:     Comments: Mildly agitated, upset     ED Results / Procedures / Treatments   Labs (all labs ordered are listed, but only abnormal results are displayed) Labs Reviewed - No data to display  EKG None  Radiology No results found.  Procedures Fecal disimpaction  Date/Time: 07/29/2020 11:55 PM Performed by: Sharlett Iles, MD Authorized by: Sharlett Iles, MD  Consent: Verbal consent obtained. Risks and benefits: risks, benefits and alternatives were discussed Consent given by: patient Patient identity confirmed: verbally with patient Local anesthesia used: no  Anesthesia: Local anesthesia used: no Comments: Patient given fentanyl for procedure Moderate amount of hard stool removed manually  Patient tolerated procedure with difficulty and eventually had to stop due to pain      Medications Ordered in ED Medications  fentaNYL (SUBLIMAZE) injection 50 mcg (50 mcg Intravenous Given 07/29/20 1910)  fentaNYL (SUBLIMAZE) injection 50 mcg (50 mcg Intravenous Given 07/29/20 2002)  sodium phosphate (FLEET) 7-19 GM/118ML enema 1 enema (1 enema Rectal Given 07/29/20 2002)    ED  Course  I have reviewed the triage vital signs and the nursing notes.      MDM Rules/Calculators/A&P                          I was able to manually disimpact somewhat but patient did not tolerate well despite pre-treating w/ fentanyl. Gave fleet enema after which patient had a large BM. Counseled pt and daughter on need for medications to treat constipation and prevent impaction.  Recommended Colace daily and MiraLAX with titration to effect as well as good hydration.  Encouraged to follow-up with PCP. Final Clinical Impression(s) / ED Diagnoses Final diagnoses:  Fecal impaction ([place])    Rx / DC Orders ED Discharge Orders         Ordered    polyethylene glycol powder (GLYCOLAX/MIRALAX) 17  GM/SCOOP powder        07/29/20 2135    docusate sodium (COLACE) 100 MG capsule  Every 12 hours        07/29/20 2135           Jeoffrey Eleazer, Wenda Overland, MD 07/29/20 2358

## 2020-07-29 NOTE — Discharge Instructions (Signed)
You can use Tucks pads for pain relief.  Drink plenty of water.

## 2020-08-22 DIAGNOSIS — H353211 Exudative age-related macular degeneration, right eye, with active choroidal neovascularization: Secondary | ICD-10-CM | POA: Diagnosis not present

## 2020-08-25 DIAGNOSIS — M8580 Other specified disorders of bone density and structure, unspecified site: Secondary | ICD-10-CM | POA: Diagnosis not present

## 2020-08-25 DIAGNOSIS — I10 Essential (primary) hypertension: Secondary | ICD-10-CM | POA: Diagnosis not present

## 2020-08-25 DIAGNOSIS — M179 Osteoarthritis of knee, unspecified: Secondary | ICD-10-CM | POA: Diagnosis not present

## 2020-08-25 DIAGNOSIS — E039 Hypothyroidism, unspecified: Secondary | ICD-10-CM | POA: Diagnosis not present

## 2020-08-25 DIAGNOSIS — E7800 Pure hypercholesterolemia, unspecified: Secondary | ICD-10-CM | POA: Diagnosis not present

## 2020-08-25 DIAGNOSIS — G4700 Insomnia, unspecified: Secondary | ICD-10-CM | POA: Diagnosis not present

## 2020-08-29 DIAGNOSIS — H353221 Exudative age-related macular degeneration, left eye, with active choroidal neovascularization: Secondary | ICD-10-CM | POA: Diagnosis not present

## 2020-09-26 DIAGNOSIS — H353211 Exudative age-related macular degeneration, right eye, with active choroidal neovascularization: Secondary | ICD-10-CM | POA: Diagnosis not present

## 2020-10-17 DIAGNOSIS — H353221 Exudative age-related macular degeneration, left eye, with active choroidal neovascularization: Secondary | ICD-10-CM | POA: Diagnosis not present

## 2020-11-07 DIAGNOSIS — H353211 Exudative age-related macular degeneration, right eye, with active choroidal neovascularization: Secondary | ICD-10-CM | POA: Diagnosis not present

## 2020-11-18 DIAGNOSIS — H40053 Ocular hypertension, bilateral: Secondary | ICD-10-CM | POA: Diagnosis not present

## 2020-11-18 DIAGNOSIS — H52203 Unspecified astigmatism, bilateral: Secondary | ICD-10-CM | POA: Diagnosis not present

## 2020-11-18 DIAGNOSIS — H40013 Open angle with borderline findings, low risk, bilateral: Secondary | ICD-10-CM | POA: Diagnosis not present

## 2020-11-18 DIAGNOSIS — Z961 Presence of intraocular lens: Secondary | ICD-10-CM | POA: Diagnosis not present

## 2020-11-21 DIAGNOSIS — H353231 Exudative age-related macular degeneration, bilateral, with active choroidal neovascularization: Secondary | ICD-10-CM | POA: Diagnosis not present

## 2020-11-21 DIAGNOSIS — H35453 Secondary pigmentary degeneration, bilateral: Secondary | ICD-10-CM | POA: Diagnosis not present

## 2020-11-21 DIAGNOSIS — H35363 Drusen (degenerative) of macula, bilateral: Secondary | ICD-10-CM | POA: Diagnosis not present

## 2020-11-21 DIAGNOSIS — Z961 Presence of intraocular lens: Secondary | ICD-10-CM | POA: Diagnosis not present

## 2020-11-21 DIAGNOSIS — H35722 Serous detachment of retinal pigment epithelium, left eye: Secondary | ICD-10-CM | POA: Diagnosis not present

## 2020-12-05 DIAGNOSIS — H353221 Exudative age-related macular degeneration, left eye, with active choroidal neovascularization: Secondary | ICD-10-CM | POA: Diagnosis not present

## 2020-12-26 DIAGNOSIS — H353211 Exudative age-related macular degeneration, right eye, with active choroidal neovascularization: Secondary | ICD-10-CM | POA: Diagnosis not present

## 2021-01-18 DIAGNOSIS — M8580 Other specified disorders of bone density and structure, unspecified site: Secondary | ICD-10-CM | POA: Diagnosis not present

## 2021-01-18 DIAGNOSIS — G4700 Insomnia, unspecified: Secondary | ICD-10-CM | POA: Diagnosis not present

## 2021-01-18 DIAGNOSIS — E039 Hypothyroidism, unspecified: Secondary | ICD-10-CM | POA: Diagnosis not present

## 2021-01-18 DIAGNOSIS — E7800 Pure hypercholesterolemia, unspecified: Secondary | ICD-10-CM | POA: Diagnosis not present

## 2021-01-18 DIAGNOSIS — I10 Essential (primary) hypertension: Secondary | ICD-10-CM | POA: Diagnosis not present

## 2021-01-18 DIAGNOSIS — M179 Osteoarthritis of knee, unspecified: Secondary | ICD-10-CM | POA: Diagnosis not present

## 2021-01-23 DIAGNOSIS — H353221 Exudative age-related macular degeneration, left eye, with active choroidal neovascularization: Secondary | ICD-10-CM | POA: Diagnosis not present

## 2021-02-09 DIAGNOSIS — H353211 Exudative age-related macular degeneration, right eye, with active choroidal neovascularization: Secondary | ICD-10-CM | POA: Diagnosis not present

## 2021-03-10 ENCOUNTER — Emergency Department (HOSPITAL_COMMUNITY): Payer: Medicare Other

## 2021-03-10 ENCOUNTER — Observation Stay (HOSPITAL_COMMUNITY)
Admission: EM | Admit: 2021-03-10 | Discharge: 2021-03-14 | Disposition: A | Discharge status: Skilled Nursing Facility | Payer: Medicare Other | Attending: Internal Medicine | Admitting: Internal Medicine

## 2021-03-10 ENCOUNTER — Encounter (HOSPITAL_COMMUNITY): Payer: Self-pay | Admitting: Emergency Medicine

## 2021-03-10 ENCOUNTER — Observation Stay (HOSPITAL_COMMUNITY): Payer: Medicare Other

## 2021-03-10 ENCOUNTER — Other Ambulatory Visit: Payer: Self-pay

## 2021-03-10 DIAGNOSIS — R262 Difficulty in walking, not elsewhere classified: Secondary | ICD-10-CM | POA: Diagnosis not present

## 2021-03-10 DIAGNOSIS — I10 Essential (primary) hypertension: Secondary | ICD-10-CM | POA: Diagnosis not present

## 2021-03-10 DIAGNOSIS — Z20822 Contact with and (suspected) exposure to covid-19: Secondary | ICD-10-CM | POA: Diagnosis not present

## 2021-03-10 DIAGNOSIS — Z79899 Other long term (current) drug therapy: Secondary | ICD-10-CM | POA: Insufficient documentation

## 2021-03-10 DIAGNOSIS — Z87891 Personal history of nicotine dependence: Secondary | ICD-10-CM | POA: Insufficient documentation

## 2021-03-10 DIAGNOSIS — R109 Unspecified abdominal pain: Secondary | ICD-10-CM

## 2021-03-10 DIAGNOSIS — Z853 Personal history of malignant neoplasm of breast: Secondary | ICD-10-CM | POA: Insufficient documentation

## 2021-03-10 DIAGNOSIS — E039 Hypothyroidism, unspecified: Secondary | ICD-10-CM | POA: Diagnosis not present

## 2021-03-10 DIAGNOSIS — K5900 Constipation, unspecified: Secondary | ICD-10-CM

## 2021-03-10 DIAGNOSIS — W01198A Fall on same level from slipping, tripping and stumbling with subsequent striking against other object, initial encounter: Secondary | ICD-10-CM | POA: Diagnosis not present

## 2021-03-10 DIAGNOSIS — S329XXA Fracture of unspecified parts of lumbosacral spine and pelvis, initial encounter for closed fracture: Secondary | ICD-10-CM

## 2021-03-10 DIAGNOSIS — Z9181 History of falling: Secondary | ICD-10-CM | POA: Insufficient documentation

## 2021-03-10 DIAGNOSIS — Z91040 Latex allergy status: Secondary | ICD-10-CM | POA: Insufficient documentation

## 2021-03-10 DIAGNOSIS — R4189 Other symptoms and signs involving cognitive functions and awareness: Secondary | ICD-10-CM | POA: Diagnosis not present

## 2021-03-10 DIAGNOSIS — C50511 Malignant neoplasm of lower-outer quadrant of right female breast: Secondary | ICD-10-CM

## 2021-03-10 DIAGNOSIS — Z170 Estrogen receptor positive status [ER+]: Secondary | ICD-10-CM

## 2021-03-10 DIAGNOSIS — S32501A Unspecified fracture of right pubis, initial encounter for closed fracture: Secondary | ICD-10-CM | POA: Diagnosis not present

## 2021-03-10 DIAGNOSIS — R0902 Hypoxemia: Secondary | ICD-10-CM | POA: Diagnosis not present

## 2021-03-10 DIAGNOSIS — E785 Hyperlipidemia, unspecified: Secondary | ICD-10-CM | POA: Diagnosis not present

## 2021-03-10 DIAGNOSIS — S32591A Other specified fracture of right pubis, initial encounter for closed fracture: Secondary | ICD-10-CM | POA: Diagnosis not present

## 2021-03-10 DIAGNOSIS — S32599A Other specified fracture of unspecified pubis, initial encounter for closed fracture: Secondary | ICD-10-CM | POA: Diagnosis present

## 2021-03-10 DIAGNOSIS — Z043 Encounter for examination and observation following other accident: Secondary | ICD-10-CM | POA: Diagnosis not present

## 2021-03-10 DIAGNOSIS — S6992XA Unspecified injury of left wrist, hand and finger(s), initial encounter: Secondary | ICD-10-CM | POA: Diagnosis not present

## 2021-03-10 DIAGNOSIS — S3289XA Fracture of other parts of pelvis, initial encounter for closed fracture: Secondary | ICD-10-CM | POA: Diagnosis present

## 2021-03-10 DIAGNOSIS — W19XXXA Unspecified fall, initial encounter: Secondary | ICD-10-CM | POA: Diagnosis not present

## 2021-03-10 DIAGNOSIS — R1031 Right lower quadrant pain: Secondary | ICD-10-CM | POA: Diagnosis not present

## 2021-03-10 LAB — COMPREHENSIVE METABOLIC PANEL
ALT: 27 U/L (ref 0–44)
AST: 35 U/L (ref 15–41)
Albumin: 3.8 g/dL (ref 3.5–5.0)
Alkaline Phosphatase: 61 U/L (ref 38–126)
Anion gap: 9 (ref 5–15)
BUN: 11 mg/dL (ref 8–23)
CO2: 22 mmol/L (ref 22–32)
Calcium: 8.6 mg/dL — ABNORMAL LOW (ref 8.9–10.3)
Chloride: 105 mmol/L (ref 98–111)
Creatinine, Ser: 0.58 mg/dL (ref 0.44–1.00)
GFR, Estimated: >60 mL/min (ref >60)
Glucose, Bld: 122 mg/dL — ABNORMAL HIGH (ref 70–99)
Potassium: 3.5 mmol/L (ref 3.5–5.1)
Sodium: 136 mmol/L (ref 135–145)
Total Bilirubin: 1.1 mg/dL (ref 0.3–1.2)
Total Protein: 6.6 g/dL (ref 6.5–8.1)

## 2021-03-10 LAB — URINALYSIS, ROUTINE W REFLEX MICROSCOPIC
Bacteria, UA: NONE SEEN
Bilirubin Urine: NEGATIVE
Glucose, UA: NEGATIVE mg/dL
Ketones, ur: NEGATIVE mg/dL
Leukocytes,Ua: NEGATIVE
Nitrite: NEGATIVE
Protein, ur: NEGATIVE mg/dL
RBC / HPF: >50 RBC/hpf — ABNORMAL HIGH (ref 0–5)
Specific Gravity, Urine: 1.009 (ref 1.005–1.030)
pH: 7 (ref 5.0–8.0)

## 2021-03-10 LAB — CBC WITH DIFFERENTIAL/PLATELET
Abs Immature Granulocytes: 0.13 10*3/uL — ABNORMAL HIGH (ref 0.00–0.07)
Basophils Absolute: 0.1 10*3/uL (ref 0.0–0.1)
Basophils Relative: 1 %
Eosinophils Absolute: 0 10*3/uL (ref 0.0–0.5)
Eosinophils Relative: 0 %
HCT: 40.9 % (ref 36.0–46.0)
Hemoglobin: 13.9 g/dL (ref 12.0–15.0)
Immature Granulocytes: 1 %
Lymphocytes Relative: 12 %
Lymphs Abs: 1.3 10*3/uL (ref 0.7–4.0)
MCH: 33.7 pg (ref 26.0–34.0)
MCHC: 34 g/dL (ref 30.0–36.0)
MCV: 99 fL (ref 80.0–100.0)
Monocytes Absolute: 0.3 10*3/uL (ref 0.1–1.0)
Monocytes Relative: 3 %
Neutro Abs: 8.9 10*3/uL — ABNORMAL HIGH (ref 1.7–7.7)
Neutrophils Relative %: 83 %
Platelets: 172 10*3/uL (ref 150–400)
RBC: 4.13 MIL/uL (ref 3.87–5.11)
RDW: 12.5 % (ref 11.5–15.5)
WBC: 10.8 10*3/uL — ABNORMAL HIGH (ref 4.0–10.5)
nRBC: 0 % (ref 0.0–0.2)

## 2021-03-10 LAB — RESP PANEL BY RT-PCR (FLU A&B, COVID) ARPGX2
Influenza A by PCR: NEGATIVE
Influenza B by PCR: NEGATIVE
SARS Coronavirus 2 by RT PCR: NEGATIVE

## 2021-03-10 LAB — CK: Total CK: 116 U/L (ref 38–234)

## 2021-03-10 MED ORDER — TRAMADOL HCL 50 MG PO TABS
25.0000 mg | ORAL_TABLET | Freq: Four times a day (QID) | ORAL | Status: DC | PRN
  Administered 2021-03-11 – 2021-03-14 (×3): 25 mg via ORAL
  Filled 2021-03-10 (×3): qty 1

## 2021-03-10 MED ORDER — ENOXAPARIN SODIUM 40 MG/0.4ML IJ SOSY
40.0000 mg | PREFILLED_SYRINGE | INTRAMUSCULAR | Status: DC
  Administered 2021-03-10 – 2021-03-13 (×2): 40 mg via SUBCUTANEOUS
  Filled 2021-03-10 (×2): qty 0.4

## 2021-03-10 MED ORDER — MORPHINE SULFATE (PF) 4 MG/ML IV SOLN: 4.0000 mg | Freq: Once | INTRAVENOUS | Status: DC

## 2021-03-10 MED ORDER — ACETAMINOPHEN 325 MG PO TABS
650.0000 mg | ORAL_TABLET | Freq: Four times a day (QID) | ORAL | Status: DC | PRN
  Administered 2021-03-11 – 2021-03-14 (×5): 650 mg via ORAL
  Filled 2021-03-10 (×5): qty 2

## 2021-03-10 MED ORDER — ONDANSETRON HCL 4 MG/2ML IJ SOLN
4.0000 mg | Freq: Once | INTRAMUSCULAR | Status: AC
  Administered 2021-03-10: 4 mg via INTRAVENOUS
  Filled 2021-03-10: qty 2

## 2021-03-10 MED ORDER — SENNOSIDES-DOCUSATE SODIUM 8.6-50 MG PO TABS
1.0000 | ORAL_TABLET | Freq: Two times a day (BID) | ORAL | Status: DC
  Administered 2021-03-11 – 2021-03-14 (×5): 1 via ORAL
  Filled 2021-03-10 (×5): qty 1

## 2021-03-10 MED ORDER — POLYETHYLENE GLYCOL 3350 17 G PO PACK
17.0000 g | PACK | Freq: Every day | ORAL | Status: DC
  Administered 2021-03-12 – 2021-03-14 (×3): 17 g via ORAL
  Filled 2021-03-10 (×4): qty 1

## 2021-03-10 NOTE — ED Provider Notes (Signed)
[facility] DEPT Provider Note   CSN: [id number] Arrival date & time: 03/10/21  1508     History Chief Complaint  Patient presents with   Kristin Bryant    Kristin Bryant is a 87 y.o. female.  The history is provided by the patient and the EMS personnel. No language interpreter was used.   87 year old female significant history of osteoporosis, hypertension, right breast cancer, thyroid disease brought here via EMS for evaluation of a fall.  Patient reports that she was vacuuming earlier in the day, she was trying to push off a stool, lost control and fell on the right side striking her hip against the ground.  She denies hitting her head or loss of consciousness but did reported acute onset of sharp severe pain about the hip and pelvic region.  Pain did improve when she received pain medication given by EMS just prior to arrival.  Pain worse with movement.  No complaint of headache neck pain chest pain trouble breathing abdominal pain or pain to her extremities except for her left hand that she may have struck against something.  She is not on any blood thinner medication.  She lives at home by herself and able to care for herself appropriately.  She denies any precipitating symptoms prior to the fall.  She denies any new numbness knee pain or ankle pain  Past Medical History:  Diagnosis Date   Breast cancer ([place]) 12/2018   right   Elevated cholesterol    Hypertension    Hypothyroidism    Osteoporosis     Patient Active Problem List   Diagnosis Date Noted   Malignant neoplasm of lower-outer quadrant of right breast of female, estrogen receptor positive ([place]) 01/22/2019   Syncope 10/15/2016   Fatigue 10/15/2016   Abnormal EKG 10/15/2016   Chest pain 10/15/2016    Past Surgical History:  Procedure Laterality Date   APPENDECTOMY     BACK SURGERY     CATARACT EXTRACTION     VAGINAL HYSTERECTOMY       OB History   No obstetric history on file.      Family History  Problem Relation Age of Onset   Stomach cancer Father    Breast cancer Sister    Prostate cancer Brother    Diabetes Son     Social History   Tobacco Use   Smoking status: Former    Types: Cigarettes   Smokeless tobacco: Never    Home Medications Prior to Admission medications   Medication Sig Start Date End Date Taking? Authorizing Provider  acetaminophen (TYLENOL) 650 MG CR tablet Take 650 mg by mouth every 8 (eight) hours as needed for pain.    [provider]  anastrozole (ARIMIDEX) 1 MG tablet Take 1 tablet (1 mg total) by mouth daily. 04/29/20   Nicholas Lose, MD  atorvastatin (LIPITOR) 40 MG tablet Take 40 mg by mouth daily.    [provider]  benazepril (LOTENSIN) 20 MG tablet Take 20 mg by mouth daily.    [provider]  Calcium Citrate (CITRACAL PO) Take 2 tablets by mouth daily.    [provider]  Cholecalciferol (VITAMIN D3) 5000 units CAPS Take 1 tablet by mouth daily.    [provider]  DiphenhydrAMINE HCl (BENADRYL ALLERGY PO) Take 1 tablet by mouth as directed.    [provider]  docusate sodium (COLACE) 100 MG capsule Take 1 capsule (100 mg total) by mouth every 12 (twelve) hours. 07/29/20  Little, Wenda Overland, MD  levothyroxine (SYNTHROID, LEVOTHROID) 100 MCG tablet Take 100 mcg by mouth daily before breakfast.    [provider]  Multiple Vitamins-Minerals (CENTRUM SILVER 50+WOMEN PO) Take 1 tablet by mouth daily.    [provider]  Multiple Vitamins-Minerals (PRESERVISION AREDS 2 PO) Take 1 capsule by mouth daily.    [provider]  polyethylene glycol powder (GLYCOLAX/MIRALAX) 17 GM/SCOOP powder Mix 1 capful in a drink and take by mouth 1-3 times daily until daily soft stools  OTC 07/29/20   Little, Wenda Overland, MD    Allergies    Arthrotec [diclofenac-misoprostol], Aspirin, Benzonatate, Celexa [citalopram], Guaifenesin & derivatives, Hydrocodone,  Lily of [place] herb [convallariae majalis], Mirtazapine, Naprosyn [naproxen], Parafon forte dsc [chlorzoxazone], Ranitidine, Sudafed [pseudoephedrine hcl], Sulfa antibiotics, Epinephrine, and Latex  Review of Systems   Review of Systems  All other systems reviewed and are negative.  Physical Exam Updated Vital Signs BP (!) 152/53   Pulse 82   Temp 97.6 F (36.4 C)   Resp 17   Ht 5\' 3"  (1.6 m)   Wt 60 kg   SpO2 100%   BMI 23.43 kg/m   Physical Exam Vitals and nursing note reviewed.  Constitutional:      General: She is not in acute distress.    Appearance: She is well-developed.  HENT:     Head: Normocephalic and atraumatic.  Eyes:     Extraocular Movements: Extraocular movements intact.     Conjunctiva/sclera: Conjunctivae normal.     Pupils: Pupils are equal, round, and reactive to light.  Neck:     Comments: No cervical midline spine tenderness crepitus or step-off Cardiovascular:     Rate and Rhythm: Normal rate and regular rhythm.     Pulses: Normal pulses.     Heart sounds: Normal heart sounds.  Pulmonary:     Effort: Pulmonary effort is normal.  Abdominal:     Palpations: Abdomen is soft.     Tenderness: There is no abdominal tenderness.  Musculoskeletal:        General: Tenderness (Right hip: Exquisite tenderness to palpation of the hip with decreased if movement secondary to pain.  No off deformity noted.  Pelvic is stable but tender to palpitation.) and signs of injury (Left hand: Ecchymosis noted to the second metacarpal region with tenderness to palpation but no deformity noted.  Able to make a fist.) present.     Cervical back: Normal range of motion and neck supple.     Comments: No significant midline spine tenderness crepitus or step-off.  Skin:    Findings: No rash.  Neurological:     Mental Status: She is alert and oriented to person, place, and time.  Psychiatric:        Mood and Affect: Mood normal.    ED Results / Procedures / Treatments    Labs (all labs ordered are listed, but only abnormal results are displayed) Labs Reviewed  CBC WITH DIFFERENTIAL/PLATELET - Abnormal; Notable for the following components:      Result Value   WBC 10.8 (*)    Neutro Abs 8.9 (*)    Abs Immature Granulocytes 0.13 (*)    All other components within normal limits  COMPREHENSIVE METABOLIC PANEL - Abnormal; Notable for the following components:   Glucose, Bld 122 (*)    Calcium 8.6 (*)    All other components within normal limits  RESP PANEL BY RT-PCR (FLU A&B, COVID) ARPGX2  CK  URINALYSIS, ROUTINE W  REFLEX MICROSCOPIC    EKG None ED ECG REPORT   Date: 03/10/2021  Rate: 79  Rhythm: normal sinus rhythm  QRS Axis: left  Intervals: normal  ST/T Wave abnormalities:  abnormal R wave progression, late transition  Conduction Disutrbances:left anterior fascicular block  Narrative Interpretation:   Old EKG Reviewed: unchanged  I have personally reviewed the EKG tracing and agree with the computerized printout as noted.   Radiology DG Hand Complete Left  Result Date: 03/10/2021 CLINICAL DATA:  Fall, injury EXAM: LEFT HAND - COMPLETE 3+ VIEW COMPARISON:  None. FINDINGS: Decreased osseous mineralization. No acute fracture. Degenerative changes at the interphalangeal joints. IMPRESSION: No acute fracture. Electronically Signed   By: Macy Mis M.D.   On: 03/10/2021 17:19   DG Hip Unilat W or Wo Pelvis 2-3 Views Right  Result Date: 03/10/2021 CLINICAL DATA:  Fall EXAM: DG HIP (WITH OR WITHOUT PELVIS) 2-3V RIGHT COMPARISON:  None. FINDINGS: SI joints are non widened. Pubic symphysis appears intact. Suspect acute nondisplaced right inferior pubic ramus fracture. Proximal right femur appears intact with normal alignment IMPRESSION: Findings suspicious for acute nondisplaced fracture involving the right inferior pubic ramus. Electronically Signed   By: Donavan Foil M.D.   On: 03/10/2021 17:42    Procedures Procedures   Medications  Ordered in ED Medications  ondansetron (ZOFRAN) injection 4 mg (4 mg Intravenous Given 03/10/21 1635)    ED Course  I have reviewed the triage vital signs and the nursing notes.  Pertinent labs & imaging results that were available during my care of the patient were reviewed by me and considered in my medical decision making (see chart for details).    MDM Rules/Calculators/A&P                           BP (!) 152/53   Pulse 82   Temp 97.6 F (36.4 C)   Resp 17   Ht 5\' 3"  (1.6 m)   Wt 60 kg   SpO2 100%   BMI 23.43 kg/m   Final Clinical Impression(s) / ED Diagnoses Final diagnoses:  Pelvis fracture, right, closed, initial encounter ([place])    Rx / DC Orders ED Discharge Orders     None      3:34 PM Patient lost balance while vacuuming and trying to push off a stool on the ground when she fell and struck her right hip against the ground earlier today.  She is having quite a bit of pain about the right hip and pelvic region concerning for hip fracture.  She also have an ecchymosis to her left hand.  She apparently was on the ground for a period of time before she was able to drag her self to get the phone.  Fortunately she is currently not on any blood thinning medication.  Initially patient did receive 200 mcg of fentanyl on route and became hypoxic with O2 sats 89% on room air.  Patient was placed on supplemental oxygen and is doing better.  She is mentating appropriately.  No precipitating symptoms prior to the fall.  6:14 PM Xray demonstrates an acute nondisplaced fx involving the right inferior pubic ramus.  This is a closed injury.  I have consulted on call orthopedist Dr. Mardelle Matte who agrees to be involve in her care.  Will consult medicine for admission as she will likely benefit from PT/OT and therapy along with pain control.  Pt voice understanding and agrees with plan.  Care  discussed with DR. Zammit.    6:48 PM I have consulted with [facility] Dr. Flossie Buffy who  agrees to see and will admit pt for further care.    Domenic Moras, PA-C 03/10/21 1850    Milton Ferguson, MD 03/10/21 2238

## 2021-03-10 NOTE — H&P (Signed)
History and Physical    Kristin Bryant DOB: 1922-06-04 DOA: 03/10/2021  PCP: Lawerance Cruel, MD  Patient coming from: Home  I have personally briefly reviewed patient's old medical records in [place]  Chief Complaint: fall and right pubic pain  HPI: Kristin Bryant is a 87 y.o. female with medical history significant for right breast cancer diagnosed in 2020 on anastrozole, hypertension, hypothyroidism and depression who presents following a fall.  Patient was vacuuming today and was trying to kick a stool out of the way when she fell landing on her right side.  She denies any lightheadedness or dizziness prior.  Did not hit her head.  Now notes right-sided pubic pain.  Also has been having chronic right sided abdominal pain at least 3-4 times daily since she was seen at [facility] in April where she was diagnosed with fecal impaction.  Reports last bowel movement about 2 days ago.  She is unsure what aggravates or alleviates her pain.  She reports drinking at least several glasses of alcohol to help with the pain at times.  Last drink was about 2 days ago.  She lives alone and is independent.  She is able to cook and still drives to grocery stores by herself.  Although during my evaluation she appeared to have some confusion with her memory.  Son at bedside confides in me that patient has been more forgetful.  Sometimes forgetting how to put her car in gear.  Other times he has gone to visit her and found the gas stove on.  Family wants her placed in nursing home but patient has adamantly refused in the past.  ED Course: She was afebrile, normotensive and placed on 4 L via nasal cannula without documented hypoxia.  Importantly it was followed medication.  However she has only received IV Zofran thus far. WBC of 10.8, hemoglobin of 13.9.  Sodium 136, K of 3.5, creatinine of 0.59, BG of 122.  CK is normal at 116. Flu/COVID PCR negative UA is pending  Left hand x-rays  negative Pelvic x-ray shows a right nondisplaced inferior pubic rami fracture.  Orthopedic was consulted by ED PA who recommends weightbearing as tolerated.  No operation is needed.  They will follow in consultation. Hospitalist on-call for admission for pain control and PT eval.  Review of Systems: Pertinent positives and negatives as above.  Unable to obtain for ROS since patient appears to have cognitive impairment  Past Medical History:  Diagnosis Date   Breast cancer ([place]) 12/2018   right   Elevated cholesterol    Hypertension    Hypothyroidism    Osteoporosis     Past Surgical History:  Procedure Laterality Date   APPENDECTOMY     BACK SURGERY     CATARACT EXTRACTION     VAGINAL HYSTERECTOMY       reports that she has quit smoking. Her smoking use included cigarettes. She has never used smokeless tobacco. No history on file for alcohol use and drug use. Social History  Allergies  Allergen Reactions   Acetaminophen     Other reaction(s): Unknown   Aspirin     Other reaction(s): Unknown   Benzonatate Nausea Only    Other reaction(s): nausea   Celexa [Citalopram]    Chlorzoxazone     Other reaction(s): Unknown   Citalopram Hydrobromide     Other reaction(s): Unknown   Cortisone     Other reaction(s): infection   Diclofenac-Misoprostol  Other reaction(s): Unknown   Escitalopram Oxalate     Other reaction(s): Unknown   Guaifenesin     Other reaction(s): Unknown   Guaifenesin & Derivatives Nausea Only   Hydrocodone     Other reaction(s): Unknown   Ipratropium Bromide     Other reaction(s): nausea   Lily Of [place] Herb [Convallariae Majalis]    Mirtazapine     Passing out Other reaction(s): passed out?   Naproxen     Other reaction(s): Unknown   Neosporin [Bacitracin-Polymyxin B]     Other reaction(s): Unknown   Prednisone     Other reaction(s): Unknown   Pseudoephedrine     Other reaction(s): Unknown   Ranitidine Nausea Only and Other (See  Comments)    Stomach upset   Ranitidine Hcl     Other reaction(s): stomach upset, nausea   Sudafed [Pseudoephedrine Hcl]    Sulfa Antibiotics     Other reaction(s): Unknown   Epinephrine Palpitations    Rapid heart rate   Latex Rash    Family History  Problem Relation Age of Onset   Stomach cancer Father    Breast cancer Sister    Prostate cancer Brother    Diabetes Son      Prior to Admission medications   Medication Sig Start Date End Date Taking? Authorizing Provider  acetaminophen (TYLENOL) 650 MG CR tablet Take 650 mg by mouth every 8 (eight) hours as needed for pain.    [provider]  anastrozole (ARIMIDEX) 1 MG tablet Take 1 tablet (1 mg total) by mouth daily. 04/29/20   Nicholas Lose, MD  atorvastatin (LIPITOR) 40 MG tablet Take 40 mg by mouth daily.    [provider]  benazepril (LOTENSIN) 20 MG tablet Take 20 mg by mouth daily.    [provider]  Calcium Citrate (CITRACAL PO) Take 2 tablets by mouth daily.    [provider]  Cholecalciferol (VITAMIN D3) 5000 units CAPS Take 1 tablet by mouth daily.    [provider]  DiphenhydrAMINE HCl (BENADRYL ALLERGY PO) Take 1 tablet by mouth as directed.    [provider]  docusate sodium (COLACE) 100 MG capsule Take 1 capsule (100 mg total) by mouth every 12 (twelve) hours. 07/29/20   Little, Wenda Overland, MD  levothyroxine (SYNTHROID, LEVOTHROID) 100 MCG tablet Take 100 mcg by mouth daily before breakfast.    [provider]  Multiple Vitamins-Minerals (CENTRUM SILVER 50+WOMEN PO) Take 1 tablet by mouth daily.    [provider]  Multiple Vitamins-Minerals (PRESERVISION AREDS 2 PO) Take 1 capsule by mouth daily.    [provider]  polyethylene glycol powder (GLYCOLAX/MIRALAX) 17 GM/SCOOP powder Mix 1 capful in a drink and take by mouth 1-3 times daily until daily soft stools  OTC 07/29/20   Little, Wenda Overland, MD    Physical  Exam: Vitals:   03/10/21 1526 03/10/21 1527 03/10/21 1630  BP:  (!) 155/79 (!) 152/53  Pulse:  81 82  Resp:  18 17  Temp:  97.6 F (36.4 C)   SpO2:  92% 100%  Weight: 60 kg    Height: 5\' 3"  (1.6 m)      Constitutional: NAD, calm, comfortable, thin elderly female laying flat in bed Vitals:   03/10/21 1526 03/10/21 1527 03/10/21 1630  BP:  (!) 155/79 (!) 152/53  Pulse:  81 82  Resp:  18 17  Temp:  97.6 F (36.4 C)   SpO2:  92% 100%  Weight:  60 kg    Height: 5\' 3"  (1.6 m)     Eyes: PERRL, lids and conjunctivae normal ENMT: Mucous membranes are moist.  Neck: normal, supple Respiratory: clear to auscultation bilaterally, no wheezing, no crackles. Normal respiratory effort.  Cardiovascular: Regular rate and rhythm, no murmurs / rubs / gallops. No extremity edema.  Abdomen: no tenderness, non-distended, no masses palpated. Bowel sounds positive.  Musculoskeletal: no clubbing / cyanosis. No joint deformity upper and lower extremities.  Skin: no rashes, lesions, ulcers. No induration Neurologic: CN 2-12 grossly intact. Alert and oriented x4 but forgetful. Cannot give detailed history.  Psychiatric:  Alert and oriented x 3. Normal mood.    Labs on Admission: I have personally reviewed following labs and imaging studies  CBC: Recent Labs  Lab 03/10/21 1636  WBC 10.8*  NEUTROABS 8.9*  HGB 13.9  HCT 40.9  MCV 99.0  PLT 546   Basic Metabolic Panel: Recent Labs  Lab 03/10/21 1636  NA 136  K 3.5  CL 105  CO2 22  GLUCOSE 122*  BUN 11  CREATININE 0.58  CALCIUM 8.6*   GFR: Estimated Creatinine Clearance: 34 mL/min (by C-G formula based on SCr of 0.58 mg/dL). Liver Function Tests: Recent Labs  Lab 03/10/21 1636  AST 35  ALT 27  ALKPHOS 61  BILITOT 1.1  PROT 6.6  ALBUMIN 3.8   No results for input(s): LIPASE, AMYLASE in the last 168 hours. No results for input(s): AMMONIA in the last 168 hours. Coagulation Profile: No results for input(s): INR, PROTIME in the  last 168 hours. Cardiac Enzymes: Recent Labs  Lab 03/10/21 1636  CKTOTAL 116   BNP (last 3 results) No results for input(s): PROBNP in the last 8760 hours. HbA1C: No results for input(s): HGBA1C in the last 72 hours. CBG: No results for input(s): GLUCAP in the last 168 hours. Lipid Profile: No results for input(s): CHOL, HDL, LDLCALC, TRIG, CHOLHDL, LDLDIRECT in the last 72 hours. Thyroid Function Tests: No results for input(s): TSH, T4TOTAL, FREET4, T3FREE, THYROIDAB in the last 72 hours. Anemia Panel: No results for input(s): VITAMINB12, FOLATE, FERRITIN, TIBC, IRON, RETICCTPCT in the last 72 hours. Urine analysis:    Component Value Date/Time   COLORURINE STRAW (A) 03/10/2021 1818   APPEARANCEUR CLEAR 03/10/2021 1818   LABSPEC 1.009 03/10/2021 1818   PHURINE 7.0 03/10/2021 1818   GLUCOSEU NEGATIVE 03/10/2021 1818   HGBUR MODERATE (A) 03/10/2021 1818   BILIRUBINUR NEGATIVE 03/10/2021 1818   KETONESUR NEGATIVE 03/10/2021 1818   PROTEINUR NEGATIVE 03/10/2021 1818   NITRITE NEGATIVE 03/10/2021 1818   LEUKOCYTESUR NEGATIVE 03/10/2021 1818    Radiological Exams on Admission: DG Hand Complete Left  Result Date: 03/10/2021 CLINICAL DATA:  Fall, injury EXAM: LEFT HAND - COMPLETE 3+ VIEW COMPARISON:  None. FINDINGS: Decreased osseous mineralization. No acute fracture. Degenerative changes at the interphalangeal joints. IMPRESSION: No acute fracture. Electronically Signed   By: Macy Mis M.D.   On: 03/10/2021 17:19   DG Hip Unilat W or Wo Pelvis 2-3 Views Right  Result Date: 03/10/2021 CLINICAL DATA:  Fall EXAM: DG HIP (WITH OR WITHOUT PELVIS) 2-3V RIGHT COMPARISON:  None. FINDINGS: SI joints are non widened. Pubic symphysis appears intact. Suspect acute nondisplaced right inferior pubic ramus fracture. Proximal right femur appears intact with normal alignment IMPRESSION: Findings suspicious for acute nondisplaced fracture involving the right inferior pubic ramus.  Electronically Signed   By: Donavan Foil M.D.   On: 03/10/2021 17:42      Assessment/Plan  Right inferior nondisplaced pubic rami fracture -Weightbearing as tolerated per Ortho -PT eval -PRN tylenol for mild and Tramadol for moderate to severe pain  Right sided abdominal pain hx of constipation -pt reports chronically right sided abdominal pain several times daily -check Abdominal X-ray  -bowel regimen with Miralax and Senna  Right breast cancer Diagnosed in 2020 Continue anastrozole Follows with Dr. Lindi Adie oncology   Hypertension Continue benazepril  Hyperlipidemia Continue atorvastatin  Hypothyroidism Continue levothyroxine  Cognitive impairment Patient lives independently at home but son reports that patient has been forgetting how to drive at times and leaving gas stove on at home.  Has been adamant about being sent to nursing home. TOCM consult   DVT prophylaxis:.Lovenox Code Status: Full Family Communication: Plan discussed with patient and son at bedside  disposition Plan: Home with observation Consults called: Orthopedic Admission status: Observation  Level of care: Med-Surg  Status is: Observation  The patient remains OBS appropriate and will d/c before 2 midnights.        Orene Desanctis DO [facility]   If 7PM-7AM, please contact night-coverage [email or web address]   03/10/2021, 7:54 PM

## 2021-03-10 NOTE — Plan of Care (Addendum)
The patient is admitted from [facility] ED to 1325. A & O x 3 with some confusion/forgetfulness. Full assessment to epic completed. Patient stated she's sore on her right hip but refused to take pain medication. No acute distress noted. Will continue to monitor.

## 2021-03-10 NOTE — Progress Notes (Signed)
Called regarding nondisplaced pubic ramus fracture.  Ok to [employer].   Anticoagulation as desired by primary team, no surgery indicated.  PT/supportive care.  Full consult to follow.   Johnny Bridge, MD

## 2021-03-10 NOTE — ED Triage Notes (Signed)
Pt arrived via EMS, from home, mechanical fall, tripped over vacuum cleaner cord at home. Was able to drag self across floor to get to phone. C/o pelvic pain. Aox4. No LOC no thinners.   Given 278mcg fentanyl total en route 22 G R hand Started on 4L [place] after medicated, 89 % RA

## 2021-03-11 DIAGNOSIS — K5900 Constipation, unspecified: Secondary | ICD-10-CM | POA: Diagnosis not present

## 2021-03-11 DIAGNOSIS — C50511 Malignant neoplasm of lower-outer quadrant of right female breast: Secondary | ICD-10-CM | POA: Diagnosis not present

## 2021-03-11 DIAGNOSIS — Z170 Estrogen receptor positive status [ER+]: Secondary | ICD-10-CM | POA: Diagnosis not present

## 2021-03-11 DIAGNOSIS — I10 Essential (primary) hypertension: Secondary | ICD-10-CM | POA: Diagnosis not present

## 2021-03-11 DIAGNOSIS — R4189 Other symptoms and signs involving cognitive functions and awareness: Secondary | ICD-10-CM

## 2021-03-11 DIAGNOSIS — S32591A Other specified fracture of right pubis, initial encounter for closed fracture: Secondary | ICD-10-CM | POA: Diagnosis not present

## 2021-03-11 DIAGNOSIS — E039 Hypothyroidism, unspecified: Secondary | ICD-10-CM

## 2021-03-11 DIAGNOSIS — E785 Hyperlipidemia, unspecified: Secondary | ICD-10-CM

## 2021-03-11 MED ORDER — ANASTROZOLE 1 MG PO TABS
1.0000 mg | ORAL_TABLET | Freq: Every day | ORAL | Status: DC
  Administered 2021-03-11 – 2021-03-14 (×4): 1 mg via ORAL
  Filled 2021-03-11 (×4): qty 1

## 2021-03-11 MED ORDER — LEVOTHYROXINE SODIUM 100 MCG PO TABS
100.0000 ug | ORAL_TABLET | Freq: Every day | ORAL | Status: DC
  Administered 2021-03-11 – 2021-03-14 (×4): 100 ug via ORAL
  Filled 2021-03-11 (×5): qty 1

## 2021-03-11 MED ORDER — BENAZEPRIL HCL 20 MG PO TABS
20.0000 mg | ORAL_TABLET | Freq: Every day | ORAL | Status: DC
  Administered 2021-03-11 – 2021-03-14 (×4): 20 mg via ORAL
  Filled 2021-03-11 (×4): qty 1

## 2021-03-11 MED ORDER — ATORVASTATIN CALCIUM 40 MG PO TABS
40.0000 mg | ORAL_TABLET | Freq: Every day | ORAL | Status: DC
  Administered 2021-03-11 – 2021-03-14 (×4): 40 mg via ORAL
  Filled 2021-03-11 (×4): qty 1

## 2021-03-11 NOTE — Consult Note (Signed)
NAME: DAMESHIA SEYBOLD MRN:   [id number] DOB:   1923-03-25   CHIEF COMPLAINT:  right pubic pain  HISTORY:   Kristin Bryant a 87 y.o. female  with with medical history significant for right breast cancer diagnosed in 2020 on anastrozole, hypertension, hypothyroidism and depression who presents following a fall.   Patient was vacuuming today and was trying to kick a stool out of the way when she fell landing on her right side.  She denies any lightheadedness or dizziness prior.  Did not hit her head.  Now notes right-sided pubic pain.  Also has been having chronic right sided abdominal pain at least 3-4 times daily since she was seen at [facility] in April where she was diagnosed with fecal impaction.  Reports last bowel movement about 2 days ago.  She is unsure what aggravates or alleviates her pain.  She reports drinking at least several glasses of alcohol to help with the pain at times.  Last drink was about 2 days ago.   She lives alone and is independent.  She is able to cook and still drives to grocery stores by herself.   She is confused as to what hospital she is at and why her normal doctor cant come see her.   PAST MEDICAL HISTORY:   Past Medical History:  Diagnosis Date   Breast cancer ([facility]) 12/2018   right   Elevated cholesterol    Hypertension    Hypothyroidism    Osteoporosis     PAST SURGICAL HISTORY:   Past Surgical History:  Procedure Laterality Date   APPENDECTOMY     BACK SURGERY     CATARACT EXTRACTION     VAGINAL HYSTERECTOMY      MEDICATIONS:   Medications Prior to Admission  Medication Sig Dispense Refill   anastrozole (ARIMIDEX) 1 MG tablet Take 1 tablet (1 mg total) by mouth daily. (Patient taking differently: Take 1 mg by mouth at bedtime.) 90 tablet 3   acetaminophen (TYLENOL) 650 MG CR tablet Take 650 mg by mouth every 8 (eight) hours as needed for pain.     atorvastatin (LIPITOR) 40 MG tablet Take 40 mg by mouth daily.     benazepril (LOTENSIN) 20 MG  tablet Take 20 mg by mouth daily.     Calcium Citrate (CITRACAL PO) Take 2 tablets by mouth daily.     Cholecalciferol (VITAMIN D3) 5000 units CAPS Take 1 tablet by mouth daily.     DiphenhydrAMINE HCl (BENADRYL ALLERGY PO) Take 1 tablet by mouth as directed.     docusate sodium (COLACE) 100 MG capsule Take 1 capsule (100 mg total) by mouth every 12 (twelve) hours. 60 capsule 0   levothyroxine (SYNTHROID, LEVOTHROID) 100 MCG tablet Take 100 mcg by mouth daily before breakfast.     Multiple Vitamins-Minerals (CENTRUM SILVER 50+WOMEN PO) Take 1 tablet by mouth daily.     Multiple Vitamins-Minerals (PRESERVISION AREDS 2 PO) Take 1 capsule by mouth daily.     polyethylene glycol powder (GLYCOLAX/MIRALAX) 17 GM/SCOOP powder Mix 1 capful in a drink and take by mouth 1-3 times daily until daily soft stools  OTC 225 g 0    ALLERGIES:   Allergies  Allergen Reactions   Acetaminophen    Aspirin    Benzonatate Nausea Only    Other reaction(s): nausea   Celexa [Citalopram]    Chlorzoxazone    Citalopram Hydrobromide    Cortisone     Other reaction(s): infection   Diclofenac-Misoprostol  Other reaction(s): Unknown   Escitalopram Oxalate     Other reaction(s): Unknown   Guaifenesin     Other reaction(s): Unknown   Guaifenesin & Derivatives Nausea Only   Hydrocodone     Other reaction(s): Unknown   Ipratropium Bromide     Other reaction(s): nausea   [employer] [place] [employer] [Convallariae Majalis]    Mirtazapine     Passing out Other reaction(s): passed out?   Naproxen     Other reaction(s): Unknown   Neosporin [Bacitracin-Polymyxin B]     Other reaction(s): Unknown   Prednisone     Other reaction(s): Unknown   Pseudoephedrine     Other reaction(s): Unknown   Ranitidine Nausea Only and Other (See Comments)    Stomach upset   Ranitidine Hcl     Other reaction(s): stomach upset, nausea   Sudafed [Pseudoephedrine Hcl]    Sulfa Antibiotics     Other reaction(s): Unknown    Epinephrine Palpitations    Rapid heart rate   Latex Rash    REVIEW OF SYSTEMS:   Negative except right pubic pain  FAMILY HISTORY:   Family History  Problem Relation Age of Onset   Stomach cancer Father    Breast cancer Sister    Prostate cancer Brother    Diabetes Son     SOCIAL HISTORY:   reports that she has quit smoking. Her smoking use included cigarettes. She has never used smokeless tobacco. No history on file for alcohol use and drug use.  PHYSICAL EXAM:  Eyes: PERRL, lids and conjunctivae normal ENMT: Mucous membranes are moist.  Neck: normal, supple Respiratory: clear to auscultation bilaterally, no wheezing, no crackles. Normal respiratory effort.  Cardiovascular: Regular rate and rhythm, no murmurs / rubs / gallops. No extremity edema.  Abdomen: no tenderness, non-distended, no masses palpated. Bowel sounds positive.  Musculoskeletal: no clubbing / cyanosis. No joint deformity upper and lower extremities. Bruising/swelling of the left hand, pain in right pubic area Skin: no rashes, lesions, ulcers. No induration Neurologic: CN 2-12 grossly intact. Alert and oriented x4 but forgetful. Cannot give detailed history.  Psychiatric:  Alert and oriented x 3. Normal mood.    LABORATORY STUDIES: Recent Labs    03/10/21 1636  WBC 10.8*  HGB 13.9  HCT 40.9  PLT 172     Recent Labs    03/10/21 1636  NA 136  K 3.5  CL 105  CO2 22  GLUCOSE 122*  BUN 11  CREATININE 0.58  CALCIUM 8.6*    STUDIES/RESULTS:  DG Abd 1 View  Result Date: 03/10/2021 CLINICAL DATA:  Right lower quadrant pain EXAM: ABDOMEN - 1 VIEW COMPARISON:  None. FINDINGS: Nonobstructed bowel-gas pattern with moderate stool. No radiopaque calculi. IMPRESSION: Negative. Electronically Signed   By: Donavan Foil M.D.   On: 03/10/2021 20:16   DG Hand Complete Left  Result Date: 03/10/2021 CLINICAL DATA:  Fall, injury EXAM: LEFT HAND - COMPLETE 3+ VIEW COMPARISON:  None. FINDINGS: Decreased osseous  mineralization. No acute fracture. Degenerative changes at the interphalangeal joints. IMPRESSION: No acute fracture. Electronically Signed   By: Macy Mis M.D.   On: 03/10/2021 17:19   DG Hip Unilat W or Wo Pelvis 2-3 Views Right  Result Date: 03/10/2021 CLINICAL DATA:  Fall EXAM: DG HIP (WITH OR WITHOUT PELVIS) 2-3V RIGHT COMPARISON:  None. FINDINGS: SI joints are non widened. Pubic symphysis appears intact. Suspect acute nondisplaced right inferior pubic ramus fracture. Proximal right femur appears intact with normal alignment IMPRESSION:  Findings suspicious for acute nondisplaced fracture involving the right inferior pubic ramus. Electronically Signed   By: Donavan Foil M.D.   On: 03/10/2021 17:42    ASSESSMENT: acute nondisplaced right inferior pubic ramus fracture  PLAN: WBAT, pain control, anticoagulation as desired by primary team, no surgery indicated. PT eval. Follow up in office once out of hospital.    Forest Pruden,STEPHEN D 03/11/2021. 8:06 AM

## 2021-03-11 NOTE — Progress Notes (Signed)
Patient refused her labs to be  drawn this morning. She rejected the rational for it and stated she will do it this afternoon. Will try again later.

## 2021-03-11 NOTE — Progress Notes (Signed)
The patient is refusing to take her synthroid and vital signs this morning. She wants everything to be done  later part of the day. We'll try again later.

## 2021-03-11 NOTE — Progress Notes (Signed)
PROGRESS NOTE  Kristin Bryant SAY:[id number] DOB: 07/14/1922 DOA: 03/10/2021 PCP: Lawerance Cruel, MD   LOS: 0 days   Brief narrative: Kristin Bryant is a 87 y.o. female with past medical history of right breast cancer diagnosed 2020 on anastrozole, hypertension, hypothyroidism and depression presented to hospital after sustaining a fall while vacuuming.  Of note patient does have chronic abdominal pain and was in the ED in April when she was noted to have fecal impaction.  Patient lives by herself at home but has been forgetting out of put her car in gear.  In the ED, patient had normal labs.  Flu and COVID was negative.  X-ray of the left hand was negative.  Pelvic x-ray showed right nondisplaced inferior pubic rami fracture.  Orthopedic was consulted who recommended weightbearing as tolerated.  Patient was then considered for admission to the hospital for further evaluation and treatment.    Assessment/Plan:  Principal Problem:   Pubic ramus fracture (HCC) Active Problems:   Malignant neoplasm of lower-outer quadrant of right breast of female, estrogen receptor positive (HCC)   Constipation   HTN (hypertension)   HLD (hyperlipidemia)   Hypothyroidism   Cognitive impairment  Right inferior nondisplaced pubic rami fracture Weightbearing as tolerated as per orthopedics.  Physical therapy and occasional therapy was consulted who has recommended skilled nursing facility placement at this time.  Continue Tylenol and tramadol for pain management.   History of chronic abdominal pain and constipation. Abdominal X-ray abdominal x-ray this was negative for acute findings. Continue MiraLAX and senna.   Right breast cancer Diagnosed in 2020. Continue anastrozole.  Follows up with Dr. Lindi Adie oncology.   Hypertension Continue benazepril.  Monitor blood pressure.   Hyperlipidemia Continue Lipitor.   Hypothyroidism Continue Synthroid   Cognitive impairment Patient lives alone but has been  more forgetful.  Son is inquiring about skilled nursing facility placement.    Debility, deconditioning.  Has been seen by physical therapy recommended skilled nursing facility placement on discharge.  DVT prophylaxis: enoxaparin (LOVENOX) injection 40 mg Start: 03/10/21 2000  Code Status: Full code  Family Communication: None today.  Status is: Observation  The patient will require care spanning > 2 midnights and should be moved to inpatient because: Unsafe disposition, ambulatory dysfunction, need for rehabilitation.   Consultants: Orthopedics  Procedures: None  Anti-infectives:  None  Anti-infectives (From admission, onward)    None      Subjective: Today, patient was seen and examined at bedside.  Mild pubic area pain.  Denies any nausea vomiting fever or chills.  Objective: Vitals:   03/11/21 0135 03/11/21 0819  BP: (!) 121/50 (!) 120/56  Pulse: 82 74  Resp: 16 18  Temp: 98.3 F (36.8 C) 98.3 F (36.8 C)  SpO2: 99% 95%    Intake/Output Summary (Last 24 hours) at 03/11/2021 1422 Last data filed at 03/11/2021 0500 Gross per 24 hour  Intake --  Output 200 ml  Net -200 ml   Filed Weights   03/10/21 1526 03/10/21 2101  Weight: 60 kg 59.4 kg   Body mass index is 23.2 kg/m.   Physical Exam: GENERAL: Patient is alert awake and communicative, not in obvious distress.  Elderly female, HENT: No scleral pallor or icterus. Pupils equally reactive to light. Oral mucosa is mildly dry NECK: is supple, no gross swelling noted. CHEST: Clear to auscultation. No crackles or wheezes.  Diminished breath sounds bilaterally. CVS: S1 and S2 heard, no murmur. Regular rate and rhythm.  ABDOMEN:  Soft, non-tender, bowel sounds are present. EXTREMITIES: No edema. CNS: Cranial nerves are intact. No focal motor deficits. SKIN: warm and dry without rashes.  Data Review: I have personally reviewed the following laboratory data and studies,  CBC: Recent Labs  Lab  2021-03-24 1636  WBC 10.8*  NEUTROABS 8.9*  HGB 13.9  HCT 40.9  MCV 99.0  PLT 122   Basic Metabolic Panel: Recent Labs  Lab March 24, 2021 1636  NA 136  K 3.5  CL 105  CO2 22  GLUCOSE 122*  BUN 11  CREATININE 0.58  CALCIUM 8.6*   Liver Function Tests: Recent Labs  Lab 24-Mar-2021 1636  AST 35  ALT 27  ALKPHOS 61  BILITOT 1.1  PROT 6.6  ALBUMIN 3.8   No results for input(s): LIPASE, AMYLASE in the last 168 hours. No results for input(s): AMMONIA in the last 168 hours. Cardiac Enzymes: Recent Labs  Lab 2021-03-24 1636  CKTOTAL 116   BNP (last 3 results) No results for input(s): BNP in the last 8760 hours.  ProBNP (last 3 results) No results for input(s): PROBNP in the last 8760 hours.  CBG: No results for input(s): GLUCAP in the last 168 hours. Recent Results (from the past 240 hour(s))  Resp Panel by RT-PCR (Flu A&B, Covid) Nasopharyngeal Swab     Status: None   Collection Time: 2021-03-24  4:36 PM   Specimen: Nasopharyngeal Swab; Nasopharyngeal(NP) swabs in vial transport medium  Result Value Ref Range Status   SARS Coronavirus 2 by RT PCR NEGATIVE NEGATIVE Final    Comment: (NOTE) SARS-CoV-2 target nucleic acids are NOT DETECTED.  The SARS-CoV-2 RNA is generally detectable in upper respiratory specimens during the acute phase of infection. The lowest concentration of SARS-CoV-2 viral copies this assay can detect is 138 copies/mL. A negative result does not preclude SARS-Cov-2 infection and should not be used as the sole basis for treatment or other patient management decisions. A negative result may occur with  improper specimen collection/handling, submission of specimen other than nasopharyngeal swab, presence of viral mutation(s) within the areas targeted by this assay, and inadequate number of viral copies(<138 copies/mL). A negative result must be combined with clinical observations, patient history, and epidemiological information. The expected result is  Negative.  Fact Sheet for Patients:  EntrepreneurPulse.com.au  Fact Sheet for Healthcare Providers:  IncredibleEmployment.be  This test is no t yet approved or cleared by the [place] FDA and  has been authorized for detection and/or diagnosis of SARS-CoV-2 by FDA under an Emergency Use Authorization (EUA). This EUA will remain  in effect (meaning this test can be used) for the duration of the COVID-19 declaration under Section 564(b)(1) of the Act, 21 U.S.C.section 360bbb-3(b)(1), unless the authorization is terminated  or revoked sooner.       Influenza A by PCR NEGATIVE NEGATIVE Final   Influenza B by PCR NEGATIVE NEGATIVE Final    Comment: (NOTE) The Xpert Xpress SARS-CoV-2/FLU/RSV plus assay is intended as an aid in the diagnosis of influenza from Nasopharyngeal swab specimens and should not be used as a sole basis for treatment. Nasal washings and aspirates are unacceptable for Xpert Xpress SARS-CoV-2/FLU/RSV testing.  Fact Sheet for Patients: EntrepreneurPulse.com.au  Fact Sheet for Healthcare Providers: IncredibleEmployment.be  This test is not yet approved or cleared by the [place] FDA and has been authorized for detection and/or diagnosis of SARS-CoV-2 by FDA under an Emergency Use Authorization (EUA). This EUA will remain in effect (meaning this test can be used) for  the duration of the COVID-19 declaration under Section 564(b)(1) of the Act, 21 U.S.C. section 360bbb-3(b)(1), unless the authorization is terminated or revoked.  Performed at [facility], [place] [address], [place], [place] [address]      Studies: DG Abd 1 View  Result Date: 03/10/2021 CLINICAL DATA:  Right lower quadrant pain EXAM: ABDOMEN - 1 VIEW COMPARISON:  None. FINDINGS: Nonobstructed bowel-gas pattern with moderate stool. No radiopaque calculi. IMPRESSION: Negative. Electronically Signed    By: Donavan Foil M.D.   On: 03/10/2021 20:16   DG Hand Complete Left  Result Date: 03/10/2021 CLINICAL DATA:  Fall, injury EXAM: LEFT HAND - COMPLETE 3+ VIEW COMPARISON:  None. FINDINGS: Decreased osseous mineralization. No acute fracture. Degenerative changes at the interphalangeal joints. IMPRESSION: No acute fracture. Electronically Signed   By: Macy Mis M.D.   On: 03/10/2021 17:19   DG Hip Unilat W or Wo Pelvis 2-3 Views Right  Result Date: 03/10/2021 CLINICAL DATA:  Fall EXAM: DG HIP (WITH OR WITHOUT PELVIS) 2-3V RIGHT COMPARISON:  None. FINDINGS: SI joints are non widened. Pubic symphysis appears intact. Suspect acute nondisplaced right inferior pubic ramus fracture. Proximal right femur appears intact with normal alignment IMPRESSION: Findings suspicious for acute nondisplaced fracture involving the right inferior pubic ramus. Electronically Signed   By: Donavan Foil M.D.   On: 03/10/2021 17:42      Flora Lipps, MD  [facility] 03/11/2021  If 7PM-7AM, please contact night-coverage

## 2021-03-11 NOTE — Evaluation (Signed)
Occupational Therapy Evaluation Patient Details Name: Kristin Bryant MRN: [id number] DOB: 1922-12-21 Today's Date: 03/11/2021   History of Present Illness 87 yo female admitted with R acute nondisplaced pubic ramus fx. Hx of breat ca, osteoporosis   Clinical Impression   [name] is a 87 year old woman who normally lives alone and is independent with ADLs. On evaluation she is limited by pain, decreased activity tolerance, impaired balance and decreased ROM and strength of RLE resulting in difficulty with mobility and ADLs.  Ambulation limited to only a several feet and patient requiring mod assist for LB ADLS and seated position for UB ADLs. Patient will benefit from skilled OT services while in hospital to improve deficits and learn compensatory strategies as needed in order to return to PLOF.       Recommendations for follow up therapy are one component of a multi-disciplinary discharge planning process, led by the attending physician.  Recommendations may be updated based on patient status, additional functional criteria and insurance authorization.   Follow Up Recommendations  Skilled nursing-short term rehab (<3 hours/day)    Assistance Recommended at Discharge Frequent or constant Supervision/Assistance  Functional Status Assessment  Patient has had a recent decline in their functional status and demonstrates the ability to make significant improvements in function in a reasonable and predictable amount of time.  Equipment Recommendations   (TBD)    Recommendations for Other Services       Precautions / Restrictions Precautions Precautions: Fall Precaution Comments: incontinent Restrictions Weight Bearing Restrictions: No RLE Weight Bearing: Weight bearing as tolerated      Mobility Bed Mobility Overal bed mobility: Needs Assistance Bed Mobility: Sit to Supine       Sit to supine: Mod assist   General bed mobility comments: for both LEs    Transfers Overall  transfer level: Needs assistance Equipment used: Rolling walker (2 wheels) Transfers: Sit to/from Stand;Bed to chair/wheelchair/BSC Sit to Stand: Min guard     Step pivot transfers: Min assist     General transfer comment: Min assist andverbal cues to take 4 steps forward and then turn to sit on bed      Balance Overall balance assessment: Needs assistance Sitting-balance support: No upper extremity supported Sitting balance-Leahy Scale: Good     Standing balance support: Bilateral upper extremity supported Standing balance-Leahy Scale: Fair Standing balance comment: can takes hands off of walker in static position                           ADL either performed or assessed with clinical judgement   ADL Overall ADL's : Needs assistance/impaired Eating/Feeding: Independent   Grooming: Set up;Sitting   Upper Body Bathing: Set up;Sitting   Lower Body Bathing: Moderate assistance;Sit to/from stand   Upper Body Dressing : Set up;Sitting   Lower Body Dressing: Moderate assistance;Sit to/from stand   Toilet Transfer: Min guard;BSC/3in1;Stand-pivot   Toileting- Water quality scientist and Hygiene: Sit to/from stand;Minimal assistance       Functional mobility during ADLs: Minimal assistance       Vision   Vision Assessment?: No apparent visual deficits     Perception     Praxis      Pertinent Vitals/Pain Pain Assessment: Faces Faces Pain Scale: Hurts even more Pain Location: R groin with activity Pain Descriptors / Indicators: Grimacing;Discomfort;Guarding;Sharp Pain Intervention(s): Limited activity within patient's tolerance;Monitored during session     Hand Dominance Right   Extremity/Trunk Assessment Upper  Extremity Assessment Upper Extremity Assessment: RUE deficits/detail;LUE deficits/detail RUE Deficits / Details: WFL ROM, 5/5 strength RUE Sensation: WNL RUE Coordination: WNL LUE Deficits / Details: WFL ROM, 5/5 strength LUE Sensation:  WNL LUE Coordination: WNL   Lower Extremity Assessment Lower Extremity Assessment: Defer to PT evaluation   Cervical / Trunk Assessment Cervical / Trunk Assessment: Normal   Communication Communication Communication: HOH   Cognition Arousal/Alertness: Awake/alert Behavior During Therapy: WFL for tasks assessed/performed Overall Cognitive Status: Within Functional Limits for tasks assessed                                 General Comments: poor insight into deficits/needs at times     General Comments       Exercises     Shoulder Instructions      Home Living Family/patient expects to be discharged to:: Private residence Living Arrangements: Alone Available Help at Discharge: Family;Available PRN/intermittently Type of Home: House Home Access: Stairs to enter [employer] of Steps: 2 Entrance Stairs-Rails: None Home Layout: One level               Home Equipment: None          Prior Functioning/Environment Prior Level of Function : Independent/Modified Independent             Mobility Comments: still drives PRN ADLs Comments: independent        OT Problem List: Decreased range of motion;Pain      OT Treatment/Interventions: Self-care/ADL training;DME and/or AE instruction;Therapeutic activities;Balance training;Patient/family education;Therapeutic exercise    OT Goals(Current goals can be found in the care plan section) Acute Rehab OT Goals Patient Stated Goal: be independent OT Goal Formulation: With patient Time For Goal Achievement: 03/25/21 Potential to Achieve Goals: Good  OT Frequency: Min 2X/week   Barriers to D/C: Decreased caregiver support          Co-evaluation              AM-PAC OT "6 Clicks" Daily Activity     Outcome Measure Help from another person eating meals?: None Help from another person taking care of personal grooming?: A Little Help from another person toileting, which includes  using toliet, bedpan, or urinal?: A Lot Help from another person bathing (including washing, rinsing, drying)?: A Lot Help from another person to put on and taking off regular upper body clothing?: A Little Help from another person to put on and taking off regular lower body clothing?: A Lot 6 Click Score: 16   End of Session Equipment Utilized During Treatment: Gait belt;Rolling walker (2 wheels) Nurse Communication: Mobility status  Activity Tolerance: Patient tolerated treatment well Patient left: in bed;with call bell/phone within reach;with bed alarm set                   Time: 0263-7858 OT Time Calculation (min): 19 min Charges:  OT General Charges $OT Visit: 1 Visit OT Evaluation $OT Eval Low Complexity: 1 Low  Morelia Cassells, OTR/L [place]  Office [phone] Pager: [phone]   Lenward Chancellor 03/11/2021, 2:02 PM

## 2021-03-11 NOTE — Evaluation (Signed)
Physical Therapy Evaluation Patient Details Name: Kristin Bryant MRN: [id number] DOB: 07-12-1922 Today's Date: 03/11/2021  History of Present Illness  87 yo female admitted with R acute nondisplaced pubic ramus fx. Hx of breat ca, osteoporosis  Clinical Impression  On eval, pt required Min Assist +2 for safe mobility. Pt was only able to tolerate taking 2 steps in room with use of a RW. Mobility is limited by pain. Pt remains at high risk for falls when mobilizing. Discussed d/c plan-PT recommendation is for ST SNF rehab to regain PLOF. Pt lives alone and was independent at baseline. She is hesitant to agree to placement. At this time, pt is not able to manage at home alone. Recommend TOC consult. Will plan to follow during hospital stay.        Recommendations for follow up therapy are one component of a multi-disciplinary discharge planning process, led by the attending physician.  Recommendations may be updated based on patient status, additional functional criteria and insurance authorization.  Follow Up Recommendations Skilled nursing-short term rehab (<3 hours/day)    Assistance Recommended at Discharge Frequent or constant Supervision/Assistance  Functional Status Assessment Patient has had a recent decline in their functional status and demonstrates the ability to make significant improvements in function in a reasonable and predictable amount of time.  Equipment Recommendations  Rolling walker (2 wheels)    Recommendations for Other Services OT consult     Precautions / Restrictions Precautions Precautions: Fall Precaution Comments: incontinent Restrictions Weight Bearing Restrictions: No RLE Weight Bearing: Weight bearing as tolerated      Mobility  Bed Mobility               General bed mobility comments: oob in recliner    Transfers Overall transfer level: Needs assistance Equipment used: Rolling walker (2 wheels) Transfers: Sit to/from Stand Sit to Stand:  Min assist           General transfer comment: Assist to rise, steady, control descent. Increased time. Cues for safety, technique, hand placement.    Ambulation/Gait Ambulation/Gait assistance: Min assist;+2 physical assistance;+2 safety/equipment Gait Distance (Feet): 2 Feet Assistive device: Rolling walker (2 wheels) Gait Pattern/deviations: Step-to pattern;Antalgic       General Gait Details: Pt only able to take 2 small steps forward. Distance limited by pain, fatigue. High falls risk. Cues for safety, technique, sequence, proper use of RW. Followed closely with recliner.  Stairs            Wheelchair Mobility    Modified Rankin (Stroke Patients Only)       Balance Overall balance assessment: Needs assistance;History of Falls         Standing balance support: Bilateral upper extremity supported Standing balance-Leahy Scale: Poor                               Pertinent Vitals/Pain Pain Assessment: Faces Faces Pain Scale: Hurts whole lot Pain Location: R groin with activity Pain Descriptors / Indicators: Grimacing;Discomfort;Guarding;Sharp Pain Intervention(s): Limited activity within patient's tolerance;Monitored during session;Repositioned    Home Living Family/patient expects to be discharged to:: Private residence Living Arrangements: Alone Available Help at Discharge: Family;Available PRN/intermittently Type of Home: House Home Access: Stairs to enter Entrance Stairs-Rails: None Entrance Stairs-Number of Steps: 2   Home Layout: One level Home Equipment: None      Prior Function Prior Level of Function : Independent/Modified Independent  Mobility Comments: still drives PRN       Hand Dominance        Extremity/Trunk Assessment   Upper Extremity Assessment Upper Extremity Assessment: Defer to OT evaluation    Lower Extremity Assessment Lower Extremity Assessment: Generalized weakness    Cervical /  Trunk Assessment Cervical / Trunk Assessment: Normal  Communication   Communication: HOH  Cognition Arousal/Alertness: Awake/alert Behavior During Therapy: WFL for tasks assessed/performed Overall Cognitive Status: Within Functional Limits for tasks assessed                                 General Comments: poor insight into deficits/needs at times        General Comments      Exercises     Assessment/Plan    PT Assessment Patient needs continued PT services  PT Problem List Decreased strength;Decreased mobility;Decreased range of motion;Decreased activity tolerance;Decreased balance;Decreased knowledge of use of DME;Pain       PT Treatment Interventions DME instruction;Gait training;Therapeutic activities;Therapeutic exercise;Patient/family education;Balance training;Functional mobility training;Stair training    PT Goals (Current goals can be found in the Care Plan section)  Acute Rehab PT Goals Patient Stated Goal: less pain. to be able to return home PT Goal Formulation: With patient Time For Goal Achievement: 03/25/21 Potential to Achieve Goals: Good    Frequency Min 3X/week   Barriers to discharge        Co-evaluation               AM-PAC PT "6 Clicks" Mobility  Outcome Measure Help needed turning from your back to your side while in a flat bed without using bedrails?: A Lot Help needed moving from lying on your back to sitting on the side of a flat bed without using bedrails?: A Lot Help needed moving to and from a bed to a chair (including a wheelchair)?: A Lot Help needed standing up from a chair using your arms (e.g., wheelchair or bedside chair)?: A Lot Help needed to walk in hospital room?: Total Help needed climbing 3-5 steps with a railing? : Total 6 Click Score: 10    End of Session Equipment Utilized During Treatment: Gait belt Activity Tolerance: Patient limited by pain;Patient limited by fatigue Patient left: in chair;with  call bell/phone within reach;with family/visitor present   PT Visit Diagnosis: Pain;History of falling (Z91.81);Difficulty in walking, not elsewhere classified (R26.2) Pain - Right/Left: Right Pain - part of body: Hip (pelvis)    Time: 8341-9622 PT Time Calculation (min) (ACUTE ONLY): 23 min   Charges:   PT Evaluation $PT Eval Moderate Complexity: 1 Mod PT Treatments $Gait Training: 8-22 mins           Doreatha Massed, PT Acute Rehabilitation  Office: [phone] Pager: [phone]

## 2021-03-12 DIAGNOSIS — S32591A Other specified fracture of right pubis, initial encounter for closed fracture: Secondary | ICD-10-CM | POA: Diagnosis not present

## 2021-03-12 DIAGNOSIS — K5900 Constipation, unspecified: Secondary | ICD-10-CM | POA: Diagnosis not present

## 2021-03-12 DIAGNOSIS — I10 Essential (primary) hypertension: Secondary | ICD-10-CM | POA: Diagnosis not present

## 2021-03-12 DIAGNOSIS — E785 Hyperlipidemia, unspecified: Secondary | ICD-10-CM | POA: Diagnosis not present

## 2021-03-12 DIAGNOSIS — R4189 Other symptoms and signs involving cognitive functions and awareness: Secondary | ICD-10-CM | POA: Diagnosis not present

## 2021-03-12 NOTE — TOC Initial Note (Addendum)
Transition of Care [facility]) - Initial/Assessment Note    Patient Details  Name: Kristin Bryant MRN: [id number] Date of Birth: [birth year]  Transition of Care [facility]) CM/SW Contact:    Ross Ludwig, LCSW Phone Number: 03/12/2021, 5:09 PM  Clinical Narrative:                  Patient is a 87 year old female who is alert and oriented x1.  CSW spoke to patient's son to complete assessment.  Per patient's son, she has not been to SNF before for rehab.  Patient's son states that she does not want to go to a SNF for rehab, however he will talk with her and discuss it.  CSW explained the process of getting insurance approval, and what to expect at a SNF.  Patient's son stated he is concerned that she may need a higher level of care after rehab like an Assisted living facility or in home care providers.  CSW explained that this CSW can work on helping her get to a SNF, then the social worker at the facility can work with patient and family to determine a plan for discharge after rehab.  CSW explained that ALFs and long term care are private pay, and people usually need to apply for Medicaid eventually.  Patient's son gave CSW permission to begin bed search in [place].  5:30pm  Patient is a Level 2 [facility] Screen, requested clinicals were sent to [facility].  Expected Discharge Plan: Skilled Nursing Facility Barriers to Discharge: Continued Medical Work up   Patient Goals and CMS Choice Patient states their goals for this hospitalization and ongoing recovery are:: To go to SNF for short term rehab, then return back home. CMS Medicare.gov Compare Post Acute Care list provided to:: Patient Represenative (must comment) Choice offered to / list presented to : Adult Children  Expected Discharge Plan and Services Expected Discharge Plan: [place] Choice: [place] arrangements for the past 2 months: Single Family Home                                       Prior Living Arrangements/Services Living arrangements for the past 2 months: Single Family Home Lives with:: Self Patient language and need for interpreter reviewed:: Yes Do you feel safe going back to the place where you live?: No   Patient's family feels she needs short term rehab before returning back home.  Need for Family Participation in Patient Care: Yes (Comment) Care giver support system in place?: No (comment)   Criminal Activity/Legal Involvement Pertinent to Current Situation/Hospitalization: No - Comment as needed  Activities of Daily Living Home Assistive Devices/Equipment: None ADL Screening (condition at time of admission) Patient's cognitive ability adequate to safely complete daily activities?: Yes Is the patient deaf or have difficulty hearing?: No Does the patient have difficulty seeing, even when wearing glasses/contacts?: No Does the patient have difficulty concentrating, remembering, or making decisions?: No Patient able to express need for assistance with ADLs?: Yes Does the patient have difficulty dressing or bathing?: No Independently performs ADLs?: Yes (appropriate for developmental age) Does the patient have difficulty walking or climbing stairs?: No Weakness of Legs: Right Weakness of Arms/Hands: None  Permission Sought/Granted Permission sought to share information with : Case Manager, Customer service manager, Family Supports Permission granted to share information with : Yes,  Release of Information Signed  Share Information with NAME: Diasia, Henken [phone]  Permission granted to share info w AGENCY: SNF admissions        Emotional Assessment Appearance:: Appears stated age   Affect (typically observed): Appropriate, Accepting, Stable Orientation: : Oriented to Self Alcohol / Substance Use: Not Applicable Psych Involvement: No (comment)  Admission diagnosis:  Pubic ramus fracture ([place]) [S32.599A] Abdominal  pain [R10.9] Pelvis fracture, right, closed, initial encounter ([place]) [S32.9XXA] Patient Active Problem List   Diagnosis Date Noted   Constipation 03/11/2021   HTN (hypertension) 03/11/2021   HLD (hyperlipidemia) 03/11/2021   Hypothyroidism 03/11/2021   Cognitive impairment 03/11/2021   Pubic ramus fracture ([place]) 03/10/2021   Malignant neoplasm of lower-outer quadrant of right breast of female, estrogen receptor positive ([place]) 01/22/2019   Syncope 10/15/2016   Fatigue 10/15/2016   Abnormal EKG 10/15/2016   Chest pain 10/15/2016   PCP:  Lawerance Cruel, MD Pharmacy:   [id number] - [place], [place] - [address] [place] [address] Phone: [phone] Fax: [phone]     Social Determinants of Health (SDOH) Interventions    Readmission Risk Interventions No flowsheet data found.

## 2021-03-12 NOTE — Plan of Care (Signed)
  Problem: Coping: Goal: Level of anxiety will decrease Outcome: Progressing   Problem: Pain Managment: Goal: General experience of comfort will improve Outcome: Progressing   Problem: Safety: Goal: Ability to remain free from injury will improve Outcome: Progressing   

## 2021-03-12 NOTE — Progress Notes (Signed)
     Subjective:  Patient reports no pain at rest, mild R buttock pain with activity. She worked well with PT yesterday requiring only minimal assist for mobility. Denies pain in other joints or extremities.  Objective:   VITALS:   Vitals:   03/11/21 0135 03/11/21 0819 03/11/21 2108 03/12/21 0602  BP: (!) 121/50 (!) 120/56 (!) 103/44 (!) 114/49  Pulse: 82 74 73 69  Resp: 16 18 16 16   Temp: 98.3 F (36.8 C) 98.3 F (36.8 C) 98.4 F (36.9 C) 98.3 F (36.8 C)  TempSrc: Oral Oral Oral Oral  SpO2: 99% 95% 94% 94%  Weight:      Height:        Tolerates hip ROM endorsing mild discomfort in buttock with hip rotation. Painless passive ROM of the knee and ankle. No effusion, nontender.  Lab Results  Component Value Date   WBC 10.8 (H) 03/10/2021   HGB 13.9 03/10/2021   HCT 40.9 03/10/2021   MCV 99.0 03/10/2021   PLT 172 03/10/2021   BMET    Component Value Date/Time   NA 136 03/10/2021 1636   K 3.5 03/10/2021 1636   CL 105 03/10/2021 1636   CO2 22 03/10/2021 1636   GLUCOSE 122 (H) 03/10/2021 1636   BUN 11 03/10/2021 1636   CREATININE 0.58 03/10/2021 1636   CALCIUM 8.6 (L) 03/10/2021 1636   GFRNONAA >60 03/10/2021 1636     Assessment/Plan:     Principal Problem:   Pubic ramus fracture (HCC) Active Problems:   Malignant neoplasm of lower-outer quadrant of right breast of female, estrogen receptor positive (HCC)   Constipation   HTN (hypertension)   HLD (hyperlipidemia)   Hypothyroidism   Cognitive impairment   R inferior pubic rami fracture - anticipate good healing and return to activities with continued nonop treatment. PLAN: WBAT, pain control, anticoagulation as desired by primary team. Follow up in office once out of hospital.    Jusiah Aguayo A Milbert Bixler 03/12/2021, 6:53 AM   Charlies Constable, MD Cell [phone]  Contact information:   [facility] 7am-5pm epic message Dr. Zachery Dakins, or call office for patient follow up: [phone] After hours  and holidays please check [email or web address] for group call information for [facility]

## 2021-03-12 NOTE — Progress Notes (Cosign Needed)
30 Day [facility] Note  RE: Kristin Bryant   Date of Birth:1922/07/24 __   Date:03/12/2021      To Whom It May Concern:  Please be advised that the above-named patient will require a short-term nursing home stay - anticipated 30 days or less for rehabilitation and strengthening.  The plan is for return home.   Evette Cristal, MSW, Marlinda Mike [phone]

## 2021-03-12 NOTE — Care Management Obs Status (Signed)
[place] NOTIFICATION   Patient Details  Name: Kristin Bryant MRN: [id number] Date of Birth: Mar 28, 1923   Medicare Observation Status Notification Given:  Yes    Cecil Cobbs 03/12/2021, 5:35 PM

## 2021-03-12 NOTE — NC FL2 (Addendum)
[place] LEVEL OF CARE SCREENING TOOL     IDENTIFICATION  Patient Name: Kristin Bryant Birthdate: March 06, 1923 Sex: female Admission Date (Current Location): 03/10/2021  [facility] and [place] Number:  Herbalist and Address:  [facility],  [address], [place]      Provider Number: [id number]  Attending Physician Name and Address:  Flora Lipps, MD  Relative Name and Phone Number:  Chace, Bisch [phone]    Current Level of Care: Hospital Recommended Level of Care: [place] Prior Approval Number:    Date Approved/Denied:   PASRR Number: [id number]  Discharge Plan: SNF    Current Diagnoses: Patient Active Problem List   Diagnosis Date Noted   Constipation 03/11/2021   HTN (hypertension) 03/11/2021   HLD (hyperlipidemia) 03/11/2021   Hypothyroidism 03/11/2021   Cognitive impairment 03/11/2021   Pubic ramus fracture ([place]) 03/10/2021   Malignant neoplasm of lower-outer quadrant of right breast of female, estrogen receptor positive ([place]) 01/22/2019   Syncope 10/15/2016   Fatigue 10/15/2016   Abnormal EKG 10/15/2016   Chest pain 10/15/2016    Orientation RESPIRATION BLADDER Height & Weight     Self  Normal Incontinent, External catheter Weight: 130 lb 15.3 oz (59.4 kg) Height:  5\' 3"  (160 cm)  BEHAVIORAL SYMPTOMS/MOOD NEUROLOGICAL BOWEL NUTRITION STATUS      Continent Diet (Regular diet)  AMBULATORY STATUS COMMUNICATION OF NEEDS Skin   Limited Assist Verbally Normal                       Personal Care Assistance Level of Assistance  Bathing, Dressing, Feeding Bathing Assistance: Limited assistance Feeding assistance: Independent Dressing Assistance: Limited assistance     Functional Limitations Info  Sight, Hearing, Speech Sight Info: Adequate Hearing Info: Adequate Speech Info: Adequate    SPECIAL CARE FACTORS FREQUENCY  PT (By licensed PT), OT (By licensed OT)     PT  Frequency: Minimum 5x a week OT Frequency: Minimum 5x a week            Contractures      Additional Factors Info  Allergies, Code Status Code Status Info: Full Code Allergies Info: Acetaminophen   Aspirin   Benzonatate   Celexa (Citalopram)   Chlorzoxazone   Citalopram Hydrobromide   Cortisone   Diclofenac-misoprostol   Escitalopram Oxalate   Guaifenesin   Guaifenesin & Derivatives   Hydrocodone   Ipratropium Bromide   Lily Of [place] Herb (Convallariae Majalis)   Mirtazapine   Naproxen   Neosporin (Bacitracin-polymyxin B)   Prednisone   Pseudoephedrine   Ranitidine   Ranitidine Hcl   Sudafed (Pseudoephedrine Hcl)   Sulfa Antibiotics   Epinephrine   Latex           Current Medications (03/13/2021):  This is the current hospital active medication list Current Facility-Administered Medications  Medication Dose Route Frequency Provider Last Rate Last Admin   acetaminophen (TYLENOL) tablet 650 mg  650 mg Oral Q6H PRN Tu, Ching T, DO   650 mg at 03/12/21 1143   anastrozole (ARIMIDEX) tablet 1 mg  1 mg Oral Daily Tu, Ching T, DO   1 mg at 03/12/21 1013   atorvastatin (LIPITOR) tablet 40 mg  40 mg Oral Daily Tu, Ching T, DO   40 mg at 03/12/21 1013   benazepril (LOTENSIN) tablet 20 mg  20 mg Oral Daily Tu, Ching T, DO   20 mg at 03/12/21 1013  enoxaparin (LOVENOX) injection 40 mg  40 mg Subcutaneous Q24H Tu, Ching T, DO   40 mg at 03/10/21 2125   levothyroxine (SYNTHROID) tablet 100 mcg  100 mcg Oral Q0600 Tu, Ching T, DO   100 mcg at 03/13/21 0063   polyethylene glycol (MIRALAX / GLYCOLAX) packet 17 g  17 g Oral Daily Tu, Ching T, DO   17 g at 03/12/21 1013   senna-docusate (Senokot-S) tablet 1 tablet  1 tablet Oral BID Tu, Ching T, DO   1 tablet at 03/12/21 1013   traMADol (ULTRAM) tablet 25 mg  25 mg Oral Q6H PRN Tu, Ching T, DO   25 mg at 03/12/21 1013     Discharge Medications: Please see discharge summary for a list of discharge medications.  Relevant Imaging  Results:  Relevant Lab Results:   Additional Information SSN [id number]  Lennart Pall, LCSW

## 2021-03-12 NOTE — Progress Notes (Signed)
PROGRESS NOTE  ORMA CHEETHAM TIW:[id number] DOB: 09/14/1922 DOA: 03/10/2021 PCP: Lawerance Cruel, MD   LOS: 0 days   Brief narrative:  Kristin Bryant is a 87 y.o. female with past medical history of right breast cancer diagnosed 2020 on anastrozole, hypertension, hypothyroidism and depression presented to hospital after sustaining a fall while vacuuming.  Patient lives by herself at home but has been forgetting out of put her car in gear.  In the ED, patient had normal labs.  Flu and COVID was negative.  X-ray of the left hand was negative.  Pelvic x-ray showed right nondisplaced inferior pubic rami fracture.  Orthopedic was consulted who recommended weightbearing as tolerated.  Patient was then considered for admission to the hospital for further evaluation and treatment due to ambulatory dysfunction.    Assessment/Plan:  Principal Problem:   Pubic ramus fracture (HCC) Active Problems:   Malignant neoplasm of lower-outer quadrant of right breast of female, estrogen receptor positive (HCC)   Constipation   HTN (hypertension)   HLD (hyperlipidemia)   Hypothyroidism   Cognitive impairment  Right inferior nondisplaced pubic rami fracture Seen by orthopedics recommended none surgical treatment.  Weightbearing as tolerated as per orthopedics.  Physical therapy and occasional therapy  recommended skilled nursing facility placement at this time.  Continue Tylenol and tramadol for pain management.   History of chronic abdominal pain and constipation. Abdominal X-ray abdominal x-ray this was negative for acute findings. Continue MiraLAX and senna.  Ensure an adequate bowel movements.   Right breast cancer Diagnosed in 2020. Continue anastrozole.  Follows up with Dr. Lindi Adie, oncology as outpatient..   Hypertension Continue benazepril.  Blood pressure is stable at this time.   Hyperlipidemia Continue Lipitor.   Hypothyroidism Continue Synthroid   Cognitive impairment Patient lives alone  but has been more forgetful.  Patient's son is inquiring about skilled nursing facility placement.    Debility, deconditioning, ambulatory dysfunction.  Has been seen by physical therapy who recommended skilled nursing facility placement on discharge.  DVT prophylaxis: enoxaparin (LOVENOX) injection 40 mg Start: 03/10/21 2000  Code Status: Full code  Family Communication:  Spoke with the patient's son at bedside.  Disposition. Skilled nursing facility placement as per PT.  Status is: Observation  The patient will require care spanning > 2 midnights and should be moved to inpatient because: Unsafe disposition, ambulatory dysfunction, need for rehabilitation.   Consultants: Orthopedics  Procedures: None  Anti-infectives:  None  Anti-infectives (From admission, onward)    None      Subjective: Today, patient was seen and examined at bedside.  Patient complains of mild pain but no other complaints.  Patient denies any urinary urgency frequency dysuria.  Denies fever chills or rigor.  Objective: Vitals:   03/11/21 2108 03/12/21 0602  BP: (!) 103/44 (!) 114/49  Pulse: 73 69  Resp: 16 16  Temp: 98.4 F (36.9 C) 98.3 F (36.8 C)  SpO2: 94% 94%    Intake/Output Summary (Last 24 hours) at 03/12/2021 0716 Last data filed at 03/12/2021 0602 Gross per 24 hour  Intake 840 ml  Output 250 ml  Net 590 ml    Filed Weights   03/10/21 1526 03/10/21 2101  Weight: 60 kg 59.4 kg   Body mass index is 23.2 kg/m.   Physical Exam: General:  Average built, not in obvious distress HENT:   No scleral pallor or icterus noted. Oral mucosa is moist.  Chest:  Clear breath sounds.  Diminished breath sounds bilaterally. No  crackles or wheezes.  CVS: S1 &S2 heard. No murmur.  Regular rate and rhythm. Abdomen: Soft, nontender, nondistended.  Bowel sounds are heard.   Extremities: No cyanosis, clubbing or edema.  Peripheral pulses are palpable. Psych: Alert, awake and communicative,  normal mood CNS:  No cranial nerve deficits.  Power equal in all extremities.   Skin: Warm and dry.  No rashes noted.  Data Review: I have personally reviewed the following laboratory data and studies,  CBC: Recent Labs  Lab 03/10/21 1636  WBC 10.8*  NEUTROABS 8.9*  HGB 13.9  HCT 40.9  MCV 99.0  PLT 706    Basic Metabolic Panel: Recent Labs  Lab 03/10/21 1636  NA 136  K 3.5  CL 105  CO2 22  GLUCOSE 122*  BUN 11  CREATININE 0.58  CALCIUM 8.6*    Liver Function Tests: Recent Labs  Lab 03/10/21 1636  AST 35  ALT 27  ALKPHOS 61  BILITOT 1.1  PROT 6.6  ALBUMIN 3.8    No results for input(s): LIPASE, AMYLASE in the last 168 hours. No results for input(s): AMMONIA in the last 168 hours. Cardiac Enzymes: Recent Labs  Lab 03/10/21 1636  CKTOTAL 116    BNP (last 3 results) No results for input(s): BNP in the last 8760 hours.  ProBNP (last 3 results) No results for input(s): PROBNP in the last 8760 hours.  CBG: No results for input(s): GLUCAP in the last 168 hours. Recent Results (from the past 240 hour(s))  Resp Panel by RT-PCR (Flu A&B, Covid) Nasopharyngeal Swab     Status: None   Collection Time: 03/10/21  4:36 PM   Specimen: Nasopharyngeal Swab; Nasopharyngeal(NP) swabs in vial transport medium  Result Value Ref Range Status   SARS Coronavirus 2 by RT PCR NEGATIVE NEGATIVE Final    Comment: (NOTE) SARS-CoV-2 target nucleic acids are NOT DETECTED.  The SARS-CoV-2 RNA is generally detectable in upper respiratory specimens during the acute phase of infection. The lowest concentration of SARS-CoV-2 viral copies this assay can detect is 138 copies/mL. A negative result does not preclude SARS-Cov-2 infection and should not be used as the sole basis for treatment or other patient management decisions. A negative result may occur with  improper specimen collection/handling, submission of specimen other than nasopharyngeal swab, presence of viral  mutation(s) within the areas targeted by this assay, and inadequate number of viral copies(<138 copies/mL). A negative result must be combined with clinical observations, patient history, and epidemiological information. The expected result is Negative.  Fact Sheet for Patients:  EntrepreneurPulse.com.au  Fact Sheet for Healthcare Providers:  IncredibleEmployment.be  This test is no t yet approved or cleared by the [place] FDA and  has been authorized for detection and/or diagnosis of SARS-CoV-2 by FDA under an Emergency Use Authorization (EUA). This EUA will remain  in effect (meaning this test can be used) for the duration of the COVID-19 declaration under Section 564(b)(1) of the Act, 21 U.S.C.section 360bbb-3(b)(1), unless the authorization is terminated  or revoked sooner.       Influenza A by PCR NEGATIVE NEGATIVE Final   Influenza B by PCR NEGATIVE NEGATIVE Final    Comment: (NOTE) The Xpert Xpress SARS-CoV-2/FLU/RSV plus assay is intended as an aid in the diagnosis of influenza from Nasopharyngeal swab specimens and should not be used as a sole basis for treatment. Nasal washings and aspirates are unacceptable for Xpert Xpress SARS-CoV-2/FLU/RSV testing.  Fact Sheet for Patients: EntrepreneurPulse.com.au  Fact Sheet for Healthcare Providers:  IncredibleEmployment.be  This test is not yet approved or cleared by the [place] and has been authorized for detection and/or diagnosis of SARS-CoV-2 by FDA under an Emergency Use Authorization (EUA). This EUA will remain in effect (meaning this test can be used) for the duration of the COVID-19 declaration under Section 564(b)(1) of the Act, 21 U.S.C. section 360bbb-3(b)(1), unless the authorization is terminated or revoked.  Performed at [facility], [place] [address], [place], [place] [address]       Studies: DG Abd  1 View  Result Date: 03/10/2021 CLINICAL DATA:  Right lower quadrant pain EXAM: ABDOMEN - 1 VIEW COMPARISON:  None. FINDINGS: Nonobstructed bowel-gas pattern with moderate stool. No radiopaque calculi. IMPRESSION: Negative. Electronically Signed   By: Donavan Foil M.D.   On: 03/10/2021 20:16   DG Hand Complete Left  Result Date: 03/10/2021 CLINICAL DATA:  Fall, injury EXAM: LEFT HAND - COMPLETE 3+ VIEW COMPARISON:  None. FINDINGS: Decreased osseous mineralization. No acute fracture. Degenerative changes at the interphalangeal joints. IMPRESSION: No acute fracture. Electronically Signed   By: Macy Mis M.D.   On: 03/10/2021 17:19   DG Hip Unilat W or Wo Pelvis 2-3 Views Right  Result Date: 03/10/2021 CLINICAL DATA:  Fall EXAM: DG HIP (WITH OR WITHOUT PELVIS) 2-3V RIGHT COMPARISON:  None. FINDINGS: SI joints are non widened. Pubic symphysis appears intact. Suspect acute nondisplaced right inferior pubic ramus fracture. Proximal right femur appears intact with normal alignment IMPRESSION: Findings suspicious for acute nondisplaced fracture involving the right inferior pubic ramus. Electronically Signed   By: Donavan Foil M.D.   On: 03/10/2021 17:42      Flora Lipps, MD  [facility] 03/12/2021  If 7PM-7AM, please contact night-coverage

## 2021-03-13 DIAGNOSIS — R4189 Other symptoms and signs involving cognitive functions and awareness: Secondary | ICD-10-CM | POA: Diagnosis not present

## 2021-03-13 DIAGNOSIS — E785 Hyperlipidemia, unspecified: Secondary | ICD-10-CM | POA: Diagnosis not present

## 2021-03-13 DIAGNOSIS — S32591A Other specified fracture of right pubis, initial encounter for closed fracture: Secondary | ICD-10-CM | POA: Diagnosis not present

## 2021-03-13 DIAGNOSIS — I10 Essential (primary) hypertension: Secondary | ICD-10-CM | POA: Diagnosis not present

## 2021-03-13 NOTE — TOC Progression Note (Addendum)
Transition of Care [facility]) - Progression Note    Patient Details  Name: Kristin Bryant MRN: [id number] Date of Birth: 1923-01-31  Transition of Care [facility]) CM/SW Contact  Lennart Pall, LCSW Phone Number: 03/13/2021, 1:13 PM  Clinical Narrative:    Have met with pt and spoke with son via phone to review SNF bed offers.  They would like to review facilities and will give facility choice tomorrow a.m.  Pt is reluctantly agreeable with plan for SNF rehab.  Once we have bed choice, then I will secure insurance authorization.  ADDENDUM: Pt /family have accepted SNF bed with [facility] and planning admission tomorrow once we have received insurance authorization.  MD aware.     Expected Discharge Plan: [place] Barriers to Discharge: Continued Medical Work up  Expected Discharge Plan and Services Expected Discharge Plan: [place] Choice: [place] arrangements for the past 2 months: Single Family Home                                       Social Determinants of Health (SDOH) Interventions    Readmission Risk Interventions No flowsheet data found.

## 2021-03-13 NOTE — Progress Notes (Signed)
Physical Therapy Treatment Patient Details Name: Kristin Bryant MRN: [id number] DOB: July 01, 1922 Today's Date: 03/13/2021   History of Present Illness 87 yo female admitted with R acute nondisplaced pubic ramus fx. Hx of breat ca, osteoporosis    PT Comments    General Comments: AxO x 3 very sweet lady able to recall how she fell, expressing all needs and following all directions. Assisted OOB to [facility] was difficult.  General bed mobility comments: required assist getting OOB with increased time esp assist to complete scooting to EOB due to R hip pain.  Required even greater assist back to bed esp to support B LE up onto bed.  + 2 Total Assist to scoot to [facility] and position to comfort.  General transfer comment: pt was able to self rise from elevated bed but unable to weight shift onto R LE to advance L LE.  "stuck in the mud".  Limited by pain.  Assisted from bed to [facility] thenh back to bed required + 2 assist to complete 1/4 pivot turn as pt was unable to weight shift due to increased PAIN R hip/pelvic area.  Assisted back to bed and positioned to comfort.  Pt will need ST Rehab at SNF prior to returning home.    Recommendations for follow up therapy are one component of a multi-disciplinary discharge planning process, led by the attending physician.  Recommendations may be updated based on patient status, additional functional criteria and insurance authorization.  Follow Up Recommendations  Skilled nursing-short term rehab (<3 hours/day)     Assistance Recommended at Discharge    Equipment Recommendations       Recommendations for Other Services       Precautions / Restrictions Precautions Precautions: Fall Precaution Comments: incontinent Restrictions Weight Bearing Restrictions: No RLE Weight Bearing: Weight bearing as tolerated     Mobility  Bed Mobility Overal bed mobility: Needs Assistance Bed Mobility: Supine to Sit;Sit to Supine     Supine to sit: Mod assist Sit to supine:  Max assist   General bed mobility comments: required assist getting OOB with increased time esp assist to complete scooting to EOB due to R hip pain.  Required even greater assist back to bed esp to support B LE up onto bed.  + 2 Total Assist to scoot to [facility] and position to comfort.    Transfers Overall transfer level: Needs assistance Equipment used: Rolling walker (2 wheels) Transfers: Sit to/from Stand;Bed to chair/wheelchair/BSC Sit to Stand: Min guard;Min assist Stand pivot transfers: Max assist;+2 physical assistance;+2 safety/equipment   Step pivot transfers: Max assist;+2 physical assistance;+2 safety/equipment     General transfer comment: pt was able to self rise from elevated bed but unable to weight shift onto R LE to advance L LE.  "stuck in the mud".  Limited by pain.  Assisted from bed to [facility] thenh back to bed required + 2 assist to complete 1/4 pivot turn as pt was unable to weight shift due to increased PAIN R hip/pelvic area.    Ambulation/Gait               General Gait Details: unable to amb this session as she was unable to take any functional steps.   Stairs             Wheelchair Mobility    Modified Rankin (Stroke Patients Only)       Balance  Cognition   Behavior During Therapy: WFL for tasks assessed/performed Overall Cognitive Status: Within Functional Limits for tasks assessed                                 General Comments: AxO x 3 very sweet lady able to recall how she fell, expressing all needs and following all directions.        Exercises      General Comments        Pertinent Vitals/Pain Pain Assessment: Faces Faces Pain Scale: Hurts little more Pain Location: R groin/hip/buttock with activity Pain Descriptors / Indicators: Grimacing;Discomfort;Guarding;Sharp Pain Intervention(s): Monitored during session;Repositioned    Home Living                           Prior Function            PT Goals (current goals can now be found in the care plan section) Progress towards PT goals: Progressing toward goals    Frequency    Min 3X/week      PT Plan Current plan remains appropriate    Co-evaluation              AM-PAC PT "6 Clicks" Mobility   Outcome Measure  Help needed turning from your back to your side while in a flat bed without using bedrails?: A Lot Help needed moving from lying on your back to sitting on the side of a flat bed without using bedrails?: A Lot Help needed moving to and from a bed to a chair (including a wheelchair)?: A Lot Help needed standing up from a chair using your arms (e.g., wheelchair or bedside chair)?: A Lot Help needed to walk in hospital room?: Total Help needed climbing 3-5 steps with a railing? : Total 6 Click Score: 10    End of Session Equipment Utilized During Treatment: Gait belt Activity Tolerance: Patient limited by pain;Patient limited by fatigue Patient left: in bed;with call bell/phone within reach;with bed alarm set Nurse Communication: Mobility status;Patient requests pain meds PT Visit Diagnosis: Pain;History of falling (Z91.81);Difficulty in walking, not elsewhere classified (R26.2) Pain - Right/Left: Right     Time: 2035-5974 PT Time Calculation (min) (ACUTE ONLY): 19 min  Charges:                        {Camry Robello  PTA Acute  Rehabilitation Services Pager      [phone] Office      [phone]

## 2021-03-13 NOTE — Progress Notes (Addendum)
PROGRESS NOTE  Kristin Bryant:[id number] DOB: January 22, 1923 DOA: 03/10/2021 PCP: Lawerance Cruel, MD   LOS: 0 days   Brief narrative:  Kristin Bryant is a 87 y.o. female with past medical history of right breast cancer diagnosed 2020 on anastrozole, hypertension, hypothyroidism and depression presented to hospital after sustaining a fall while vacuuming.  Patient lives by herself at home but has been forgetting out of put her car in gear.  In the ED, patient had normal labs.  Flu and COVID was negative.  X-ray of the left hand was negative.  Pelvic x-ray showed right nondisplaced inferior pubic rami fracture.  Orthopedic was consulted who recommended weightbearing as tolerated.  Patient was then considered for admission to the hospital for further evaluation and treatment due to ambulatory dysfunction.    Assessment/Plan:  Principal Problem:   Pubic ramus fracture (HCC) Active Problems:   Malignant neoplasm of lower-outer quadrant of right breast of female, estrogen receptor positive (HCC)   Constipation   HTN (hypertension)   HLD (hyperlipidemia)   Hypothyroidism   Cognitive impairment  Right inferior nondisplaced pubic rami fracture Seen by orthopedics recommended non- surgical treatment.  Weightbearing as tolerated as per orthopedics.  Physical therapy and occasional therapy  recommended skilled nursing facility placement at this time.  Continue Tylenol and tramadol for pain management.   History of chronic abdominal pain and constipation. Abdominal X-ray was negative for acute findings. Continue MiraLAX and senna.  Ensure an adequate bowel movements.   Right breast cancer Diagnosed in 2020. Continue anastrozole.  Follows up with Dr. Lindi Adie, oncology as outpatient..   Hypertension Continue benazepril.  Blood pressure is stable at this time.   Hyperlipidemia Continue Lipitor.   Hypothyroidism Continue Synthroid   Cognitive impairment Patient lives alone but has been more  forgetful.   Debility, deconditioning, ambulatory dysfunction.  Has been seen by physical therapy who recommended skilled nursing facility placement on discharge.  DVT prophylaxis: enoxaparin (LOVENOX) injection 40 mg Start: 03/10/21 2000  Code Status: Full code  Family Communication:  Spoke with the patient's son on 03/13/21  Disposition. Skilled nursing facility placement as per PT. TOC aware. Status is: Observation  The patient will require care spanning > 2 midnights and should be moved to inpatient because: Unsafe disposition, ambulatory dysfunction, need for rehabilitation.   Consultants: Orthopedics  Procedures: None  Anti-infectives:  None  Anti-infectives (From admission, onward)    None      Subjective: Today, patient was seen and examined at bedside.  Patient complains of mild suprapubic pain.    Objective: Vitals:   03/12/21 2231 03/13/21 0511  BP: (!) 140/53 (!) 134/54  Pulse: 77 79  Resp: 16 16  Temp: 98.6 F (37 C) 98.7 F (37.1 C)  SpO2: 94% 92%    Intake/Output Summary (Last 24 hours) at 03/13/2021 0941 Last data filed at 03/13/2021 0600 Gross per 24 hour  Intake 540 ml  Output 575 ml  Net -35 ml    Filed Weights   03/10/21 1526 03/10/21 2101  Weight: 60 kg 59.4 kg   Body mass index is 23.2 kg/m.   Physical Exam:  General:  Average built, not in obvious distress HENT:   No scleral pallor or icterus noted. Oral mucosa is moist.  Chest:   Diminished breath sounds bilaterally. No crackles or wheezes.  CVS: S1 &S2 heard. No murmur.  Regular rate and rhythm. Abdomen: Soft, nontender, nondistended.  Bowel sounds are heard.   Extremities: No cyanosis, clubbing  or edema.  Peripheral pulses are palpable. Psych: Alert, awake and communicative. CNS:  No cranial nerve deficits.  Power equal in all extremities.   Skin: Warm and dry.  No rashes noted.  Data Review: I have personally reviewed the following laboratory data and  studies,  CBC: Recent Labs  Lab 03/10/21 1636  WBC 10.8*  NEUTROABS 8.9*  HGB 13.9  HCT 40.9  MCV 99.0  PLT 947    Basic Metabolic Panel: Recent Labs  Lab 03/10/21 1636  NA 136  K 3.5  CL 105  CO2 22  GLUCOSE 122*  BUN 11  CREATININE 0.58  CALCIUM 8.6*    Liver Function Tests: Recent Labs  Lab 03/10/21 1636  AST 35  ALT 27  ALKPHOS 61  BILITOT 1.1  PROT 6.6  ALBUMIN 3.8    No results for input(s): LIPASE, AMYLASE in the last 168 hours. No results for input(s): AMMONIA in the last 168 hours. Cardiac Enzymes: Recent Labs  Lab 03/10/21 1636  CKTOTAL 116    BNP (last 3 results) No results for input(s): BNP in the last 8760 hours.  ProBNP (last 3 results) No results for input(s): PROBNP in the last 8760 hours.  CBG: No results for input(s): GLUCAP in the last 168 hours. Recent Results (from the past 240 hour(s))  Resp Panel by RT-PCR (Flu A&B, Covid) Nasopharyngeal Swab     Status: None   Collection Time: 03/10/21  4:36 PM   Specimen: Nasopharyngeal Swab; Nasopharyngeal(NP) swabs in vial transport medium  Result Value Ref Range Status   SARS Coronavirus 2 by RT PCR NEGATIVE NEGATIVE Final    Comment: (NOTE) SARS-CoV-2 target nucleic acids are NOT DETECTED.  The SARS-CoV-2 RNA is generally detectable in upper respiratory specimens during the acute phase of infection. The lowest concentration of SARS-CoV-2 viral copies this assay can detect is 138 copies/mL. A negative result does not preclude SARS-Cov-2 infection and should not be used as the sole basis for treatment or other patient management decisions. A negative result may occur with  improper specimen collection/handling, submission of specimen other than nasopharyngeal swab, presence of viral mutation(s) within the areas targeted by this assay, and inadequate number of viral copies(<138 copies/mL). A negative result must be combined with clinical observations, patient history, and  epidemiological information. The expected result is Negative.  Fact Sheet for Patients:  EntrepreneurPulse.com.au  Fact Sheet for Healthcare Providers:  IncredibleEmployment.be  This test is no t yet approved or cleared by the [place] FDA and  has been authorized for detection and/or diagnosis of SARS-CoV-2 by FDA under an Emergency Use Authorization (EUA). This EUA will remain  in effect (meaning this test can be used) for the duration of the COVID-19 declaration under Section 564(b)(1) of the Act, 21 U.S.C.section 360bbb-3(b)(1), unless the authorization is terminated  or revoked sooner.       Influenza A by PCR NEGATIVE NEGATIVE Final   Influenza B by PCR NEGATIVE NEGATIVE Final    Comment: (NOTE) The Xpert Xpress SARS-CoV-2/FLU/RSV plus assay is intended as an aid in the diagnosis of influenza from Nasopharyngeal swab specimens and should not be used as a sole basis for treatment. Nasal washings and aspirates are unacceptable for Xpert Xpress SARS-CoV-2/FLU/RSV testing.  Fact Sheet for Patients: EntrepreneurPulse.com.au  Fact Sheet for Healthcare Providers: IncredibleEmployment.be  This test is not yet approved or cleared by the [place] FDA and has been authorized for detection and/or diagnosis of SARS-CoV-2 by FDA under an Emergency Use Authorization (  EUA). This EUA will remain in effect (meaning this test can be used) for the duration of the COVID-19 declaration under Section 564(b)(1) of the Act, 21 U.S.C. section 360bbb-3(b)(1), unless the authorization is terminated or revoked.  Performed at [facility], [place] [address], [place], [place] [address]       Studies: No results found.    Kristin Lipps, MD  [facility] 03/13/2021  If 7PM-7AM, please contact night-coverage

## 2021-03-13 NOTE — Plan of Care (Signed)
  Problem: Pain Managment: Goal: General experience of comfort will improve Outcome: Progressing   Problem: Safety: Goal: Ability to remain free from injury will improve Outcome: Progressing   

## 2021-03-14 DIAGNOSIS — Z91040 Latex allergy status: Secondary | ICD-10-CM | POA: Diagnosis not present

## 2021-03-14 DIAGNOSIS — S32501A Unspecified fracture of right pubis, initial encounter for closed fracture: Secondary | ICD-10-CM | POA: Diagnosis not present

## 2021-03-14 DIAGNOSIS — Z20822 Contact with and (suspected) exposure to covid-19: Secondary | ICD-10-CM | POA: Diagnosis not present

## 2021-03-14 DIAGNOSIS — G3184 Mild cognitive impairment, so stated: Secondary | ICD-10-CM | POA: Diagnosis not present

## 2021-03-14 DIAGNOSIS — R4189 Other symptoms and signs involving cognitive functions and awareness: Secondary | ICD-10-CM | POA: Diagnosis not present

## 2021-03-14 DIAGNOSIS — Z743 Need for continuous supervision: Secondary | ICD-10-CM | POA: Diagnosis not present

## 2021-03-14 DIAGNOSIS — E785 Hyperlipidemia, unspecified: Secondary | ICD-10-CM | POA: Diagnosis not present

## 2021-03-14 DIAGNOSIS — S32599S Other specified fracture of unspecified pubis, sequela: Secondary | ICD-10-CM | POA: Diagnosis not present

## 2021-03-14 DIAGNOSIS — Z87891 Personal history of nicotine dependence: Secondary | ICD-10-CM | POA: Diagnosis not present

## 2021-03-14 DIAGNOSIS — R262 Difficulty in walking, not elsewhere classified: Secondary | ICD-10-CM | POA: Diagnosis not present

## 2021-03-14 DIAGNOSIS — R21 Rash and other nonspecific skin eruption: Secondary | ICD-10-CM | POA: Diagnosis not present

## 2021-03-14 DIAGNOSIS — R52 Pain, unspecified: Secondary | ICD-10-CM | POA: Diagnosis not present

## 2021-03-14 DIAGNOSIS — K5900 Constipation, unspecified: Secondary | ICD-10-CM | POA: Diagnosis not present

## 2021-03-14 DIAGNOSIS — R531 Weakness: Secondary | ICD-10-CM | POA: Diagnosis not present

## 2021-03-14 DIAGNOSIS — C50511 Malignant neoplasm of lower-outer quadrant of right female breast: Secondary | ICD-10-CM | POA: Diagnosis not present

## 2021-03-14 DIAGNOSIS — Z7401 Bed confinement status: Secondary | ICD-10-CM | POA: Diagnosis not present

## 2021-03-14 DIAGNOSIS — S32591A Other specified fracture of right pubis, initial encounter for closed fracture: Secondary | ICD-10-CM | POA: Diagnosis not present

## 2021-03-14 DIAGNOSIS — Z170 Estrogen receptor positive status [ER+]: Secondary | ICD-10-CM | POA: Diagnosis not present

## 2021-03-14 DIAGNOSIS — Z9181 History of falling: Secondary | ICD-10-CM | POA: Diagnosis not present

## 2021-03-14 DIAGNOSIS — Z79899 Other long term (current) drug therapy: Secondary | ICD-10-CM | POA: Diagnosis not present

## 2021-03-14 DIAGNOSIS — E039 Hypothyroidism, unspecified: Secondary | ICD-10-CM | POA: Diagnosis not present

## 2021-03-14 DIAGNOSIS — I10 Essential (primary) hypertension: Secondary | ICD-10-CM | POA: Diagnosis not present

## 2021-03-14 DIAGNOSIS — R6889 Other general symptoms and signs: Secondary | ICD-10-CM | POA: Diagnosis not present

## 2021-03-14 DIAGNOSIS — Z853 Personal history of malignant neoplasm of breast: Secondary | ICD-10-CM | POA: Diagnosis not present

## 2021-03-14 LAB — BASIC METABOLIC PANEL
Anion gap: 8 (ref 5–15)
BUN: 14 mg/dL (ref 8–23)
CO2: 27 mmol/L (ref 22–32)
Calcium: 8.9 mg/dL (ref 8.9–10.3)
Chloride: 101 mmol/L (ref 98–111)
Creatinine, Ser: 0.6 mg/dL (ref 0.44–1.00)
GFR, Estimated: >60 mL/min (ref >60)
Glucose, Bld: 135 mg/dL — ABNORMAL HIGH (ref 70–99)
Potassium: 4.2 mmol/L (ref 3.5–5.1)
Sodium: 136 mmol/L (ref 135–145)

## 2021-03-14 LAB — CBC
HCT: 33.3 % — ABNORMAL LOW (ref 36.0–46.0)
Hemoglobin: 11.3 g/dL — ABNORMAL LOW (ref 12.0–15.0)
MCH: 33.1 pg (ref 26.0–34.0)
MCHC: 33.9 g/dL (ref 30.0–36.0)
MCV: 97.7 fL (ref 80.0–100.0)
Platelets: 126 10*3/uL — ABNORMAL LOW (ref 150–400)
RBC: 3.41 MIL/uL — ABNORMAL LOW (ref 3.87–5.11)
RDW: 13.2 % (ref 11.5–15.5)
WBC: 6.3 10*3/uL (ref 4.0–10.5)
nRBC: 0 % (ref 0.0–0.2)

## 2021-03-14 MED ORDER — TRAMADOL HCL 50 MG PO TABS: 25.0000 mg | ORAL_TABLET | Freq: Four times a day (QID) | ORAL | 0 refills | Status: AC | PRN

## 2021-03-14 MED ORDER — DULCOLAX 5 MG PO TBEC: 5.0000 mg | DELAYED_RELEASE_TABLET | Freq: Every day | ORAL | Status: AC | PRN

## 2021-03-14 NOTE — TOC Transition Note (Signed)
Transition of Care [facility]) - CM/SW Discharge Note   Patient Details  Name: Kristin Bryant MRN: [id number] Date of Birth: 09/05/1922  Transition of Care [facility]) CM/SW Contact:  Lennart Pall, LCSW Phone Number: 03/14/2021, 11:32 AM   Clinical Narrative:    Have received insurance authorization and pt medically cleared for dc today to [facility].  Pt and son aware and agreeable.  PTAR called at 11:15am.  RN to call report to [phone] (ask for "Loretta").  No further TOC needs.   Final next level of care: Skilled Nursing Facility Barriers to Discharge: Barriers Resolved   Patient Goals and CMS Choice Patient states their goals for this hospitalization and ongoing recovery are:: To go to SNF for short term rehab, then return back home. CMS Medicare.gov Compare Post Acute Care list provided to:: Patient Represenative (must comment) Choice offered to / list presented to : Adult Children  Discharge Placement PASRR number recieved: 03/12/21            Patient chooses bed at: [facility] Patient to be transferred to facility by: [facility] Name of family member notified: son, Mikki Santee Patient and family notified of of transfer: 03/14/21  Discharge Plan and Services     Post Acute Care Choice: [place]          DME Arranged: N/A DME Agency: NA                  Social Determinants of Health (SDOH) Interventions     Readmission Risk Interventions No flowsheet data found.

## 2021-03-14 NOTE — Progress Notes (Signed)
Occupational Therapy Treatment Patient Details Name: Kristin Bryant MRN: [id number] DOB: 03/10/1923 Today's Date: 03/14/2021   History of present illness 87 yo female admitted with R acute nondisplaced pubic ramus fx. Hx of breat ca, osteoporosis   OT comments  Pt motivated but painful today. She was able to come EOB and participate in grooming and education for compensatory strategies for AE to access LB. Educated in Law[place]. Pt able to demonstrate reacher/grabber and verbalized understanding with sock aid. Attempted OOB transfer x2 with RW and face to face transfer and Pt very limited by pain, screaming out and declining further attempts. Returned supine with max A and total A+2 for bed repositioning. OT will continue to follow acutely. POC remains appropriate.   Recommendations for follow up therapy are one component of a multi-disciplinary discharge planning process, led by the attending physician.  Recommendations may be updated based on patient status, additional functional criteria and insurance authorization.    Follow Up Recommendations  Skilled nursing-short term rehab (<3 hours/day)    Assistance Recommended at Discharge Frequent or constant Supervision/Assistance  Equipment Recommendations  Other (comment) (defer to next venue of care)    Recommendations for Other Services      Precautions / Restrictions Precautions Precautions: Fall Precaution Comments: incontinent Restrictions Weight Bearing Restrictions: Yes RLE Weight Bearing: Weight bearing as tolerated       Mobility Bed Mobility Overal bed mobility: Needs Assistance Bed Mobility: Supine to Sit;Sit to Supine     Supine to sit: Min assist;HOB elevated (increased time, step by step, min A for leg assist EOB and trunk elevation - Pt utilizing bed rails to assist) Sit to supine: Max assist (increased time, verbal cues for step by step instruction)   General bed mobility comments: required assist  getting OOB with increased time esp assist to complete scooting to EOB due to R hip pain.  Required even greater assist back to bed esp to support B LE up onto bed.  + 2 Total Assist to scoot to [facility] and position to comfort.    Transfers Overall transfer level: Needs assistance Equipment used: Rolling walker (2 wheels);1 person hand held assist Transfers: Sit to/from Stand Sit to Stand: Max assist           General transfer comment: Pt very limited by pain this session, attempted sit<>stand x2 once with RW and once with face to ace and pad scoop support an unable to achieve full upright. Pt screaming out in pain.     Balance Overall balance assessment: Needs assistance Sitting-balance support: No upper extremity supported Sitting balance-Leahy Scale: Fair Sitting balance - Comments: able to maintain sitting balance EOB without pain for approx 15 min for grooming and AE education   Standing balance support: Bilateral upper extremity supported Standing balance-Leahy Scale: Zero                             ADL either performed or assessed with clinical judgement   ADL Overall ADL's : Needs assistance/impaired     Grooming: Set up;Sitting;Brushing hair;Wash/dry hands;Wash/dry face;Oral care Grooming Details (indicate cue type and reason): EOB     Lower Body Bathing: With adaptive equipment;Sitting/lateral leans;Min guard Lower Body Bathing Details (indicate cue type and reason): educated on long handle sponge as tool for accessing LB bathing. Demoed for Pt     Lower Body Dressing: Maximal assistance;With adaptive equipment;Sitting/lateral leans;Sit to/from stand Lower Body Dressing Details (indicate cue type  and reason): Pt edcuated in grabber/reacher as tool for LB dressing. Pt able to demonstrate pulling pillow case (simulated clothing item) up to knees. Pt also educated in sock donner, Pt MAX A for sit<>stand today due to pain Toilet Transfer: Maximal  assistance;Rolling walker (2 wheels) (and face to face) Toilet Transfer Details (indicate cue type and reason): attempted SPT x2 once with RW and once with face to face. Pt screaming out in pain and declining further mobility today         Functional mobility during ADLs: Maximal assistance;Rolling walker (2 wheels) General ADL Comments: limited by pain, able to sit EOB for AE education for compensatory strategies for ADL    Extremity/Trunk Assessment     Lower Extremity Assessment Lower Extremity Assessment: Defer to PT evaluation        Vision   Vision Assessment?: No apparent visual deficits   Perception     Praxis      Cognition Arousal/Alertness: Awake/alert Behavior During Therapy: WFL for tasks assessed/performed Overall Cognitive Status: Impaired/Different from baseline Area of Impairment: Memory                     Memory: Decreased short-term memory         General Comments: AxO x 3 very sweet lady, expressing all needs and following all directions. however stating "I have not stood up yet" and did not recall working with therapy previously.          Exercises     Shoulder Instructions       General Comments      Pertinent Vitals/ Pain       Pain Assessment: 0-10 Pain Score: 10-Worst pain ever Breathing: normal Negative Vocalization: none Facial Expression: smiling or inexpressive Body Language: relaxed Consolability: no need to console PAINAD Score: 0 Pain Location: R groin/hip/buttock with activity Pain Descriptors / Indicators: Grimacing;Discomfort;Guarding;Sharp Pain Intervention(s): Monitored during session;Repositioned;Patient requesting pain meds-RN notified  Home Living                                          Prior Functioning/Environment              Frequency  Min 2X/week        Progress Toward Goals  OT Goals(current goals can now be found in the care plan section)  Progress towards OT  goals: Not progressing toward goals - comment (decreased standing activity tolerance for ADL today)  Acute Rehab OT Goals Patient Stated Goal: to get back to independent OT Goal Formulation: With patient Time For Goal Achievement: 03/25/21 Potential to Achieve Goals: Good  Plan Discharge plan remains appropriate;Frequency remains appropriate    Co-evaluation                 AM-PAC OT "6 Clicks" Daily Activity     Outcome Measure   Help from another person eating meals?: None Help from another person taking care of personal grooming?: A Little Help from another person toileting, which includes using toliet, bedpan, or urinal?: A Lot Help from another person bathing (including washing, rinsing, drying)?: A Lot Help from another person to put on and taking off regular upper body clothing?: A Little Help from another person to put on and taking off regular lower body clothing?: A Lot 6 Click Score: 16    End of Session Equipment Utilized During Treatment: Gait belt;Rolling  walker (2 wheels)  OT Visit Diagnosis: Unsteadiness on feet (R26.81);History of falling (Z91.81);Muscle weakness (generalized) (M62.81);Other symptoms and signs involving cognitive function;Pain Pain - Right/Left: Right Pain - part of body: Hip   Activity Tolerance Patient limited by pain   Patient Left in bed;with call bell/phone within reach;with bed alarm set   Nurse Communication Mobility status;Patient requests pain meds        Time: 4417-1278 OT Time Calculation (min): 50 min  Charges: OT General Charges $OT Visit: 1 Visit OT Treatments $Self Care/Home Management : 23-37 mins $Therapeutic Activity: 8-22 mins  Jesse Sans OTR/L Acute Rehabilitation Services Pager: [phone] Office: [place] 03/14/2021, 10:09 AM

## 2021-03-14 NOTE — Discharge Summary (Signed)
Physician Discharge Summary  Kristin Bryant JTT:[id number] DOB: 03-27-1923 DOA: 03/10/2021  PCP: Lawerance Cruel, MD  Admit date: 03/10/2021 Discharge date: 03/14/2021  Admitted From: Home  Discharge disposition: Skilled nursing facility.  Recommendations for Outpatient Follow-Up:   Follow up with your primary care provider at the skilled nursing facility in 3 to 5 days. Check CBC, BMP, magnesium in the next visit Focus should be on pain management/Ensure bowel movements. Follow-up with orthopedics in 2 weeks for pubic ramus fracture follow-up.Marland Kitchen  Discharge Diagnosis:   Principal Problem:   Pubic ramus fracture (HCC) Active Problems:   Malignant neoplasm of lower-outer quadrant of right breast of female, estrogen receptor positive (HCC)   Constipation   HTN (hypertension)   HLD (hyperlipidemia)   Hypothyroidism   Cognitive impairment   Discharge Condition: Improved.  Diet recommendation:  Regular.  Wound care: None.  Code status: Full.  History of Present Illness:   Kristin Bryant is a 87 y.o. female with past medical history of right breast cancer diagnosed 2020 on anastrozole, hypertension, hypothyroidism and depression presented to hospital after sustaining a fall while vacuuming.  Patient lives by herself at home but has been forgetting out of put her car in gear.  In the ED, patient had normal labs.  Flu and COVID was negative.  X-ray of the left hand was negative.  Pelvic x-ray showed right nondisplaced inferior pubic rami fracture.  Orthopedic was consulted who recommended weightbearing as tolerated.  Patient was then considered for admission to the hospital for further evaluation and treatment due to ambulatory dysfunction.    Hospital Course:   Following conditions were addressed during hospitalization as listed below,  Right inferior nondisplaced pubic rami fracture Seen by orthopedics recommended non- surgical treatment.  Weightbearing as tolerated as per  orthopedics.  Physical therapy and occupational therapy  recommended skilled nursing facility placement at this time.  Continue Tylenol and tramadol for pain management.   History of chronic abdominal pain and constipation. Abdominal X-ray was negative for acute findings. Continue MiraLAX and senna.  Ensure an adequate bowel movements.  Dulcolax suppository if needed for constipation if not helped with MiraLAX and stool softener..   Right breast cancer Diagnosed in 2020. Continue anastrozole.  Follows up with Dr. Lindi Adie, oncology as outpatient..   Hypertension Continue benazepril.  Blood pressure is stable at this time.   Hyperlipidemia Continue Lipitor.   Hypothyroidism Continue Synthroid   Cognitive impairment Patient lived alone but has been more forgetful.    Debility, deconditioning, ambulatory dysfunction.   Has been seen by physical therapy who recommended skilled nursing facility placement on discharge.   Disposition.  At this time, patient is stable for disposition to skilled nursing facility.  Medical Consultants:   Orthopedics  Procedures:    None Subjective:   Today, patient with some mild pubic area pain.  Discharge Exam:   Vitals:   03/13/21 2122 03/14/21 0511  BP: 137/60 (!) 148/66  Pulse: 82 88  Resp: 17 18  Temp: 98.5 F (36.9 C) 98.3 F (36.8 C)  SpO2: 95% 94%   Vitals:   03/13/21 0511 03/13/21 1323 03/13/21 2122 03/14/21 0511  BP: (!) 134/54 (!) 119/56 137/60 (!) 148/66  Pulse: 79 90 82 88  Resp: 16 16 17 18   Temp: 98.7 F (37.1 C) 98.8 F (37.1 C) 98.5 F (36.9 C) 98.3 F (36.8 C)  TempSrc: Oral  Oral Oral  SpO2: 92% 93% 95% 94%  Weight:  Height:       General: Alert awake, not in obvious distress HENT: pupils equally reacting to light,  No scleral pallor or icterus noted. Oral mucosa is moist.  Chest:  Diminished breath sounds bilaterally. No crackles or wheezes.  CVS: S1 &S2 heard. No murmur.  Regular rate and  rhythm. Abdomen: Soft, nontender, nondistended.  Bowel sounds are heard.   Extremities: No cyanosis, clubbing or edema.  Peripheral pulses are palpable. Psych: Alert, awake and communicative, normal mood CNS:  No cranial nerve deficits.  Power equal in all extremities.   Skin: Warm and dry.  No rashes noted.  The results of significant diagnostics from this hospitalization (including imaging, microbiology, ancillary and laboratory) are listed below for reference.     Diagnostic Studies:   DG Abd 1 View  Result Date: 03/10/2021 CLINICAL DATA:  Right lower quadrant pain EXAM: ABDOMEN - 1 VIEW COMPARISON:  None. FINDINGS: Nonobstructed bowel-gas pattern with moderate stool. No radiopaque calculi. IMPRESSION: Negative. Electronically Signed   By: Donavan Foil M.D.   On: 03/10/2021 20:16   DG Hand Complete Left  Result Date: 03/10/2021 CLINICAL DATA:  Fall, injury EXAM: LEFT HAND - COMPLETE 3+ VIEW COMPARISON:  None. FINDINGS: Decreased osseous mineralization. No acute fracture. Degenerative changes at the interphalangeal joints. IMPRESSION: No acute fracture. Electronically Signed   By: Macy Mis M.D.   On: 03/10/2021 17:19   DG Hip Unilat W or Wo Pelvis 2-3 Views Right  Result Date: 03/10/2021 CLINICAL DATA:  Fall EXAM: DG HIP (WITH OR WITHOUT PELVIS) 2-3V RIGHT COMPARISON:  None. FINDINGS: SI joints are non widened. Pubic symphysis appears intact. Suspect acute nondisplaced right inferior pubic ramus fracture. Proximal right femur appears intact with normal alignment IMPRESSION: Findings suspicious for acute nondisplaced fracture involving the right inferior pubic ramus. Electronically Signed   By: Donavan Foil M.D.   On: 03/10/2021 17:42     Labs:   Basic Metabolic Panel: Recent Labs  Lab 03/10/21 1636 03/14/21 0315  NA 136 136  K 3.5 4.2  CL 105 101  CO2 22 27  GLUCOSE 122* 135*  BUN 11 14  CREATININE 0.58 0.60  CALCIUM 8.6* 8.9   GFR Estimated Creatinine Clearance:  34 mL/min (by C-G formula based on SCr of 0.6 mg/dL). Liver Function Tests: Recent Labs  Lab 03/10/21 1636  AST 35  ALT 27  ALKPHOS 61  BILITOT 1.1  PROT 6.6  ALBUMIN 3.8   No results for input(s): LIPASE, AMYLASE in the last 168 hours. No results for input(s): AMMONIA in the last 168 hours. Coagulation profile No results for input(s): INR, PROTIME in the last 168 hours.  CBC: Recent Labs  Lab 03/10/21 1636 03/14/21 0315  WBC 10.8* 6.3  NEUTROABS 8.9*  --   HGB 13.9 11.3*  HCT 40.9 33.3*  MCV 99.0 97.7  PLT 172 126*   Cardiac Enzymes: Recent Labs  Lab 03/10/21 1636  CKTOTAL 116   BNP: Invalid input(s): POCBNP CBG: No results for input(s): GLUCAP in the last 168 hours. D-Dimer No results for input(s): DDIMER in the last 72 hours. Hgb A1c No results for input(s): HGBA1C in the last 72 hours. Lipid Profile No results for input(s): CHOL, HDL, LDLCALC, TRIG, CHOLHDL, LDLDIRECT in the last 72 hours. Thyroid function studies No results for input(s): TSH, T4TOTAL, T3FREE, THYROIDAB in the last 72 hours.  Invalid input(s): FREET3 Anemia work up No results for input(s): VITAMINB12, FOLATE, FERRITIN, TIBC, IRON, RETICCTPCT in the last 72 hours. Microbiology  Recent Results (from the past 240 hour(s))  Resp Panel by RT-PCR (Flu A&B, Covid) Nasopharyngeal Swab     Status: None   Collection Time: 03/10/21  4:36 PM   Specimen: Nasopharyngeal Swab; Nasopharyngeal(NP) swabs in vial transport medium  Result Value Ref Range Status   SARS Coronavirus 2 by RT PCR NEGATIVE NEGATIVE Final    Comment: (NOTE) SARS-CoV-2 target nucleic acids are NOT DETECTED.  The SARS-CoV-2 RNA is generally detectable in upper respiratory specimens during the acute phase of infection. The lowest concentration of SARS-CoV-2 viral copies this assay can detect is 138 copies/mL. A negative result does not preclude SARS-Cov-2 infection and should not be used as the sole basis for treatment or other  patient management decisions. A negative result may occur with  improper specimen collection/handling, submission of specimen other than nasopharyngeal swab, presence of viral mutation(s) within the areas targeted by this assay, and inadequate number of viral copies(<138 copies/mL). A negative result must be combined with clinical observations, patient history, and epidemiological information. The expected result is Negative.  Fact Sheet for Patients:  EntrepreneurPulse.com.au  Fact Sheet for Healthcare Providers:  IncredibleEmployment.be  This test is no t yet approved or cleared by the [place] FDA and  has been authorized for detection and/or diagnosis of SARS-CoV-2 by FDA under an Emergency Use Authorization (EUA). This EUA will remain  in effect (meaning this test can be used) for the duration of the COVID-19 declaration under Section 564(b)(1) of the Act, 21 U.S.C.section 360bbb-3(b)(1), unless the authorization is terminated  or revoked sooner.       Influenza A by PCR NEGATIVE NEGATIVE Final   Influenza B by PCR NEGATIVE NEGATIVE Final    Comment: (NOTE) The Xpert Xpress SARS-CoV-2/FLU/RSV plus assay is intended as an aid in the diagnosis of influenza from Nasopharyngeal swab specimens and should not be used as a sole basis for treatment. Nasal washings and aspirates are unacceptable for Xpert Xpress SARS-CoV-2/FLU/RSV testing.  Fact Sheet for Patients: EntrepreneurPulse.com.au  Fact Sheet for Healthcare Providers: IncredibleEmployment.be  This test is not yet approved or cleared by the [place] FDA and has been authorized for detection and/or diagnosis of SARS-CoV-2 by FDA under an Emergency Use Authorization (EUA). This EUA will remain in effect (meaning this test can be used) for the duration of the COVID-19 declaration under Section 564(b)(1) of the Act, 21 U.S.C. section  360bbb-3(b)(1), unless the authorization is terminated or revoked.  Performed at [facility], [place] [address], [place], [place] [address]      Discharge Instructions:   Discharge Instructions     Call MD for:  severe uncontrolled pain   Complete by: As directed    Diet general   Complete by: As directed    Discharge instructions   Complete by: As directed    Follow-up with your primary care provider at the skilled nursing facility in 3 to 5 days.  Continue physical therapy.  Follow-up with orthopedics in 2 weeks.   Increase activity slowly   Complete by: As directed       Allergies as of 03/14/2021       Reactions   Acetaminophen    Aspirin    Benzonatate Nausea Only   Other reaction(s): nausea   Celexa [citalopram]    Chlorzoxazone    Citalopram Hydrobromide    Cortisone    Other reaction(s): infection   Diclofenac-misoprostol    Other reaction(s): Unknown   Escitalopram Oxalate    Other reaction(s):  Unknown   Guaifenesin    Other reaction(s): Unknown   Guaifenesin & Derivatives Nausea Only   Hydrocodone    Other reaction(s): Unknown   Ipratropium Bromide    Other reaction(s): nausea   Lily Of [place] Herb [convallariae Majalis]    Mirtazapine    Passing out Other reaction(s): passed out?   Naproxen    Other reaction(s): Unknown   Neosporin [bacitracin-polymyxin B]    Other reaction(s): Unknown   Prednisone    Other reaction(s): Unknown   Pseudoephedrine    Other reaction(s): Unknown   Ranitidine Nausea Only, Other (See Comments)   Stomach upset   Ranitidine Hcl    Other reaction(s): stomach upset, nausea   Sudafed [pseudoephedrine Hcl]    Sulfa Antibiotics    Other reaction(s): Unknown   Epinephrine Palpitations   Rapid heart rate   Latex Rash        Medication List     TAKE these medications    acetaminophen 650 MG CR tablet Commonly known as: TYLENOL Take 650 mg by mouth every 8 (eight) hours as needed for  pain.   anastrozole 1 MG tablet Commonly known as: ARIMIDEX Take 1 tablet (1 mg total) by mouth daily. What changed: when to take this   atorvastatin 40 MG tablet Commonly known as: LIPITOR Take 40 mg by mouth daily.   BENADRYL ALLERGY PO Take 1 tablet by mouth as directed.   benazepril 20 MG tablet Commonly known as: LOTENSIN Take 20 mg by mouth daily.   CITRACAL PO Take 2 tablets by mouth daily.   docusate sodium 100 MG capsule Commonly known as: COLACE Take 1 capsule (100 mg total) by mouth every 12 (twelve) hours.   Dulcolax 5 MG EC tablet Generic drug: bisacodyl Take 1 tablet (5 mg total) by mouth daily as needed for moderate constipation (if no bowel movement with miralax and stool softner).   latanoprost 0.005 % ophthalmic solution Commonly known as: XALATAN 1 drop at bedtime.   levothyroxine 100 MCG tablet Commonly known as: SYNTHROID Take 100 mcg by mouth daily before breakfast.   polyethylene glycol powder 17 GM/SCOOP powder Commonly known as: GLYCOLAX/MIRALAX Mix 1 capful in a drink and take by mouth 1-3 times daily until daily soft stools  OTC   PRESERVISION AREDS 2 PO Take 1 capsule by mouth daily.   CENTRUM SILVER 50+WOMEN PO Take 1 tablet by mouth daily.   timolol 0.5 % ophthalmic solution Commonly known as: TIMOPTIC Place 1 drop into both eyes 2 (two) times daily.   traMADol 50 MG tablet Commonly known as: ULTRAM Take 0.5 tablets (25 mg total) by mouth every 6 (six) hours as needed for up to 15 days for severe pain or moderate pain.   Vitamin D3 125 MCG (5000 UT) Caps Take 1 tablet by mouth daily.          Follow-up Information     Marchia Bond, MD. Schedule an appointment as soon as possible for a visit in 2 week(s).   Specialty: Orthopedic Surgery Contact information: [place] [address] [phone]                  Time coordinating discharge: 39 minutes  Signed:  Patrich Heinze  [facility] 03/14/2021, 8:18 AM

## 2021-03-22 DIAGNOSIS — E039 Hypothyroidism, unspecified: Secondary | ICD-10-CM | POA: Diagnosis not present

## 2021-03-22 DIAGNOSIS — R52 Pain, unspecified: Secondary | ICD-10-CM | POA: Diagnosis not present

## 2021-03-22 DIAGNOSIS — R21 Rash and other nonspecific skin eruption: Secondary | ICD-10-CM | POA: Diagnosis not present

## 2021-03-22 DIAGNOSIS — I10 Essential (primary) hypertension: Secondary | ICD-10-CM | POA: Diagnosis not present

## 2021-03-22 DIAGNOSIS — E785 Hyperlipidemia, unspecified: Secondary | ICD-10-CM | POA: Diagnosis not present

## 2021-03-22 DIAGNOSIS — S32599S Other specified fracture of unspecified pubis, sequela: Secondary | ICD-10-CM | POA: Diagnosis not present

## 2021-03-23 DIAGNOSIS — E785 Hyperlipidemia, unspecified: Secondary | ICD-10-CM | POA: Diagnosis not present

## 2021-03-23 DIAGNOSIS — G3184 Mild cognitive impairment, so stated: Secondary | ICD-10-CM | POA: Diagnosis not present

## 2021-03-23 DIAGNOSIS — I10 Essential (primary) hypertension: Secondary | ICD-10-CM | POA: Diagnosis not present

## 2021-03-23 DIAGNOSIS — E039 Hypothyroidism, unspecified: Secondary | ICD-10-CM | POA: Diagnosis not present

## 2021-03-29 DIAGNOSIS — S32599S Other specified fracture of unspecified pubis, sequela: Secondary | ICD-10-CM | POA: Diagnosis not present

## 2021-04-10 DIAGNOSIS — G3184 Mild cognitive impairment, so stated: Secondary | ICD-10-CM | POA: Diagnosis not present

## 2021-04-13 ENCOUNTER — Other Ambulatory Visit: Payer: Self-pay | Admitting: *Deleted

## 2021-04-13 DIAGNOSIS — R531 Weakness: Secondary | ICD-10-CM | POA: Diagnosis not present

## 2021-04-13 NOTE — Patient Outreach (Signed)
Per [facility] it appears [facility] eligible member resides in [facility] SNF.  Screened for potential [facility] Care Management services.   Communication sent to facility SW to inquire about transition plans.  Will collaborate with facility SW and follow up with resident/family as appropriate.  Will continue to follow for potential [facility] needs and transition plans while member resides in SNF.    Marthenia Rolling, MSN, RN,BSN [place] Acute Care Coordinator [phone] [facility]) [phone]  (Toll free office)

## 2021-04-14 ENCOUNTER — Other Ambulatory Visit: Payer: Self-pay | Admitting: *Deleted

## 2021-04-14 NOTE — Patient Outreach (Signed)
[facility] Post- Acute Care Coordinator follow up. Per [place] eligible member resides in  [facility]. Screened for potential [facility] Care Management services.   Update received from [facility] Admissions Coordinator indicating Mrs. Neace will transition to [facility] on Monday 04/17/21.  Writer will sign off. No identifiable [facility] Care Management needs at this time.     Marthenia Rolling, MSN, RN,BSN [place] Acute Care Coordinator [phone] [facility]) [phone]  (Toll free office)

## 2021-04-17 DIAGNOSIS — M6281 Muscle weakness (generalized): Secondary | ICD-10-CM | POA: Diagnosis not present

## 2021-04-17 DIAGNOSIS — S32501D Unspecified fracture of right pubis, subsequent encounter for fracture with routine healing: Secondary | ICD-10-CM | POA: Diagnosis not present

## 2021-04-17 DIAGNOSIS — R410 Disorientation, unspecified: Secondary | ICD-10-CM | POA: Diagnosis not present

## 2021-04-17 DIAGNOSIS — R2689 Other abnormalities of gait and mobility: Secondary | ICD-10-CM | POA: Diagnosis not present

## 2021-04-17 DIAGNOSIS — Z9181 History of falling: Secondary | ICD-10-CM | POA: Diagnosis not present

## 2021-04-19 ENCOUNTER — Encounter (HOSPITAL_COMMUNITY): Payer: Self-pay | Admitting: Emergency Medicine

## 2021-04-19 ENCOUNTER — Emergency Department (HOSPITAL_COMMUNITY): Payer: Medicare Other

## 2021-04-19 ENCOUNTER — Emergency Department (HOSPITAL_COMMUNITY)
Admission: EM | Admit: 2021-04-19 | Discharge: 2021-04-19 | Disposition: A | Discharge status: Home / Self Care | Payer: Medicare Other | Attending: Emergency Medicine | Admitting: Emergency Medicine

## 2021-04-19 DIAGNOSIS — M25572 Pain in left ankle and joints of left foot: Secondary | ICD-10-CM | POA: Diagnosis not present

## 2021-04-19 DIAGNOSIS — W1839XA Other fall on same level, initial encounter: Secondary | ICD-10-CM | POA: Diagnosis not present

## 2021-04-19 DIAGNOSIS — R109 Unspecified abdominal pain: Secondary | ICD-10-CM | POA: Diagnosis not present

## 2021-04-19 DIAGNOSIS — Z743 Need for continuous supervision: Secondary | ICD-10-CM | POA: Diagnosis not present

## 2021-04-19 DIAGNOSIS — R404 Transient alteration of awareness: Secondary | ICD-10-CM | POA: Diagnosis not present

## 2021-04-19 DIAGNOSIS — Z20822 Contact with and (suspected) exposure to covid-19: Secondary | ICD-10-CM | POA: Insufficient documentation

## 2021-04-19 DIAGNOSIS — Y92000 Kitchen of unspecified non-institutional (private) residence as  the place of occurrence of the external cause: Secondary | ICD-10-CM | POA: Diagnosis not present

## 2021-04-19 DIAGNOSIS — R9431 Abnormal electrocardiogram [ECG] [EKG]: Secondary | ICD-10-CM | POA: Diagnosis not present

## 2021-04-19 DIAGNOSIS — Z91040 Latex allergy status: Secondary | ICD-10-CM | POA: Insufficient documentation

## 2021-04-19 DIAGNOSIS — R531 Weakness: Secondary | ICD-10-CM | POA: Diagnosis not present

## 2021-04-19 DIAGNOSIS — W19XXXA Unspecified fall, initial encounter: Secondary | ICD-10-CM

## 2021-04-19 DIAGNOSIS — M545 Low back pain, unspecified: Secondary | ICD-10-CM | POA: Diagnosis not present

## 2021-04-19 DIAGNOSIS — M25551 Pain in right hip: Secondary | ICD-10-CM | POA: Diagnosis not present

## 2021-04-19 DIAGNOSIS — R110 Nausea: Secondary | ICD-10-CM | POA: Diagnosis not present

## 2021-04-19 LAB — COMPREHENSIVE METABOLIC PANEL
ALT: 24 U/L (ref 0–44)
AST: 34 U/L (ref 15–41)
Albumin: 3.9 g/dL (ref 3.5–5.0)
Alkaline Phosphatase: 99 U/L (ref 38–126)
Anion gap: 9 (ref 5–15)
BUN: 19 mg/dL (ref 8–23)
CO2: 23 mmol/L (ref 22–32)
Calcium: 9.7 mg/dL (ref 8.9–10.3)
Chloride: 105 mmol/L (ref 98–111)
Creatinine, Ser: 0.83 mg/dL (ref 0.44–1.00)
GFR, Estimated: >60 mL/min (ref >60)
Glucose, Bld: 145 mg/dL — ABNORMAL HIGH (ref 70–99)
Potassium: 4.3 mmol/L (ref 3.5–5.1)
Sodium: 137 mmol/L (ref 135–145)
Total Bilirubin: 1.4 mg/dL — ABNORMAL HIGH (ref 0.3–1.2)
Total Protein: 6.9 g/dL (ref 6.5–8.1)

## 2021-04-19 LAB — CBC WITH DIFFERENTIAL/PLATELET
Abs Immature Granulocytes: 0.07 10*3/uL (ref 0.00–0.07)
Basophils Absolute: 0 10*3/uL (ref 0.0–0.1)
Basophils Relative: 0 %
Eosinophils Absolute: 0 10*3/uL (ref 0.0–0.5)
Eosinophils Relative: 0 %
HCT: 32.7 % — ABNORMAL LOW (ref 36.0–46.0)
Hemoglobin: 11.2 g/dL — ABNORMAL LOW (ref 12.0–15.0)
Immature Granulocytes: 1 %
Lymphocytes Relative: 9 %
Lymphs Abs: 0.9 10*3/uL (ref 0.7–4.0)
MCH: 33.8 pg (ref 26.0–34.0)
MCHC: 34.3 g/dL (ref 30.0–36.0)
MCV: 98.8 fL (ref 80.0–100.0)
Monocytes Absolute: 0.4 10*3/uL (ref 0.1–1.0)
Monocytes Relative: 4 %
Neutro Abs: 8.6 10*3/uL — ABNORMAL HIGH (ref 1.7–7.7)
Neutrophils Relative %: 86 %
Platelets: 253 10*3/uL (ref 150–400)
RBC: 3.31 MIL/uL — ABNORMAL LOW (ref 3.87–5.11)
RDW: 12.6 % (ref 11.5–15.5)
WBC: 10 10*3/uL (ref 4.0–10.5)
nRBC: 0 % (ref 0.0–0.2)

## 2021-04-19 LAB — RESP PANEL BY RT-PCR (FLU A&B, COVID) ARPGX2
Influenza A by PCR: NEGATIVE
Influenza B by PCR: NEGATIVE
SARS Coronavirus 2 by RT PCR: NEGATIVE

## 2021-04-19 LAB — TROPONIN I (HIGH SENSITIVITY): Troponin I (High Sensitivity): 4 ng/L (ref <18)

## 2021-04-19 LAB — LIPASE, BLOOD: Lipase: 26 U/L (ref 11–51)

## 2021-04-19 NOTE — ED Notes (Signed)
1 blue top and 2 gold tops also send down to lab.

## 2021-04-19 NOTE — ED Triage Notes (Signed)
Pt BIB EMS after falling in kitchen from passing out. Reported abdominal pain and nausea for multiple weeks. Poor appetite and generalized weakness for several weeks. Recent right hip fx, unable to bear full weight. Pt noted to favor right side. A&O x3, no blood thinners  BP 116/60 P 92 RR 18 SpO2 99 RA CBG 185

## 2021-04-19 NOTE — ED Provider Notes (Signed)
[facility] DEPT Provider Note   CSN: [id number] Arrival date & time: 04/19/21  1515     History  Chief Complaint  Patient presents with   Lytle Michaels    Kristin Bryant is a 88 y.o. female.  To ER chief complaint of a fall.  Patient states that she was in the bathroom and is not quite sure what happened.  Unclear if she passed out or not no other witnesses present.  Family states that she had been complaining of some abdominal pain earlier in the week but currently denies any new abdominal pain.  Denies any headache or chest pain.  Denies any hip pain or extremity pain.  She is back to her normal mental status per family at bedside.  No reports of any fevers or cough or vomiting or diarrhea.  She was recently admitted for right pelvic fracture.      Home Medications Prior to Admission medications   Medication Sig Start Date End Date Taking? Authorizing Provider  acetaminophen (TYLENOL) 650 MG CR tablet Take 650 mg by mouth every 8 (eight) hours as needed for pain.    [provider]  anastrozole (ARIMIDEX) 1 MG tablet Take 1 tablet (1 mg total) by mouth daily. Patient taking differently: Take 1 mg by mouth at bedtime. 04/29/20   Nicholas Lose, MD  atorvastatin (LIPITOR) 40 MG tablet Take 40 mg by mouth daily.    [provider]  benazepril (LOTENSIN) 20 MG tablet Take 20 mg by mouth daily.    [provider]  bisacodyl (DULCOLAX) 5 MG EC tablet Take 1 tablet (5 mg total) by mouth daily as needed for moderate constipation (if no bowel movement with miralax and stool softner). 03/14/21 03/14/22  Pokhrel, Corrie Mckusick, MD  Calcium Citrate (CITRACAL PO) Take 2 tablets by mouth daily.    [provider]  Cholecalciferol (VITAMIN D3) 5000 units CAPS Take 1 tablet by mouth daily.    [provider]  DiphenhydrAMINE HCl (BENADRYL ALLERGY PO) Take 1 tablet by mouth as directed.    [provider]  docusate sodium (COLACE) 100  MG capsule Take 1 capsule (100 mg total) by mouth every 12 (twelve) hours. 07/29/20   Little, Wenda Overland, MD  latanoprost (XALATAN) 0.005 % ophthalmic solution 1 drop at bedtime.    [provider]  levothyroxine (SYNTHROID, LEVOTHROID) 100 MCG tablet Take 100 mcg by mouth daily before breakfast.    [provider]  Multiple Vitamins-Minerals (CENTRUM SILVER 50+WOMEN PO) Take 1 tablet by mouth daily.    [provider]  Multiple Vitamins-Minerals (PRESERVISION AREDS 2 PO) Take 1 capsule by mouth daily.    [provider]  polyethylene glycol powder (GLYCOLAX/MIRALAX) 17 GM/SCOOP powder Mix 1 capful in a drink and take by mouth 1-3 times daily until daily soft stools  OTC 07/29/20   Little, Wenda Overland, MD  timolol (TIMOPTIC) 0.5 % ophthalmic solution Place 1 drop into both eyes 2 (two) times daily.    [provider]      Allergies    Acetaminophen, Aspirin, Benzonatate, Celexa [citalopram], Chlorzoxazone, Citalopram hydrobromide, Cortisone, Diclofenac-misoprostol, Escitalopram oxalate, Guaifenesin, Guaifenesin & derivatives, Hydrocodone, Ipratropium bromide, Lily of the valley herb [convallariae majalis], Mirtazapine, Naproxen, Neosporin [bacitracin-polymyxin b], Prednisone, Pseudoephedrine, Ranitidine, Ranitidine hcl, Sudafed [pseudoephedrine hcl], Sulfa antibiotics, Epinephrine, and Latex    Review of Systems   Review of Systems  Constitutional:  Negative for fever.  HENT:  Negative for ear pain.   Eyes:  Negative  for pain.  Respiratory:  Negative for cough.   Cardiovascular:  Negative for chest pain.  Gastrointestinal:  Negative for abdominal pain.  Genitourinary:  Negative for flank pain.  Musculoskeletal:  Negative for back pain.  Skin:  Negative for rash.  Neurological:  Negative for headaches.   Physical Exam Updated Vital Signs BP (!) 123/53    Pulse 80    Temp 98.1 F (36.7 C) (Oral)    Resp 12    SpO2 96%  Physical  Exam Constitutional:      General: She is not in acute distress.    Appearance: Normal appearance.  HENT:     Head: Normocephalic.     Nose: Nose normal.  Eyes:     Extraocular Movements: Extraocular movements intact.  Cardiovascular:     Rate and Rhythm: Normal rate.  Pulmonary:     Effort: Pulmonary effort is normal.  Musculoskeletal:        General: Normal range of motion.     Cervical back: Normal range of motion.     Comments: No C or T or L-spine midline step-offs or tenderness noted.  Patient ranging bilateral shoulders elbows wrists and hips knees and ankles without pain.  No deformity of the extremities noted.  Neurological:     General: No focal deficit present.     Mental Status: She is alert. Mental status is at baseline.    ED Results / Procedures / Treatments   Labs (all labs ordered are listed, but only abnormal results are displayed) Labs Reviewed  CBC WITH DIFFERENTIAL/PLATELET - Abnormal; Notable for the following components:      Result Value   RBC 3.31 (*)    Hemoglobin 11.2 (*)    HCT 32.7 (*)    Neutro Abs 8.6 (*)    All other components within normal limits  COMPREHENSIVE METABOLIC PANEL - Abnormal; Notable for the following components:   Glucose, Bld 145 (*)    Total Bilirubin 1.4 (*)    All other components within normal limits  RESP PANEL BY RT-PCR (FLU A&B, COVID) ARPGX2  LIPASE, BLOOD  TROPONIN I (HIGH SENSITIVITY)    EKG EKG Interpretation  Date/Time:  Wednesday April 19 2021 16:01:00 EST Ventricular Rate:  94 PR Interval:  228 QRS Duration: 68 QT Interval:  366 QTC Calculation: 458 R Axis:   -50 Text Interpretation: Sinus rhythm Prolonged PR interval Left anterior fascicular block Low voltage, precordial leads Consider anterior infarct Confirmed by Thamas Jaegers ([id number]) on 04/19/2021 4:23:55 PM  Radiology CT Head Wo Contrast  Result Date: 04/19/2021 CLINICAL DATA:  Patient passed out and fell in the kitchen. Generalized weakness  for several weeks. EXAM: CT HEAD WITHOUT CONTRAST TECHNIQUE: Contiguous axial images were obtained from the base of the skull through the vertex without intravenous contrast. RADIATION DOSE REDUCTION: This exam was performed according to the departmental dose-optimization program which includes automated exposure control, adjustment of the mA and/or kV according to patient size and/or use of iterative reconstruction technique. COMPARISON:  None FINDINGS: Brain: No evidence of acute infarction, hemorrhage, hydrocephalus, extra-axial collection or mass lesion/mass effect. Cerebral atrophy and chronic microvascular ischemic changes of the white matter. Vascular: No hyperdense vessel or unexpected calcification. Skull: Normal. Negative for fracture or focal lesion. Sinuses/Orbits: No acute finding. Other: None. IMPRESSION: 1.  No acute intracranial abnormality. 2. Cerebral atrophy and chronic microvascular ischemic changes of the white matter. Electronically Signed   By: Keane Police D.O.   On: 04/19/2021 16:43  Procedures Procedures    Medications Ordered in ED Medications - No data to display  ED Course/ Medical Decision Making/ A&P                           Medical Decision Making  Labs show normal white count hemoglobin 11 chemistry unremarkable troponin negative viral panel negative as well.  Patient observed in the ER for several hours no additional adverse events.  CT head is unremarkable as well.  EKG shows sinus rhythm no ST elevations or depressions noted.  She appears pleasant and comfortable at this time in no acute distress, case discussed with husband was at bedside.  Patient be discharged back to her facility in stable condition.        Final Clinical Impression(s) / ED Diagnoses Final diagnoses:  Fall, initial encounter    Rx / DC Orders ED Discharge Orders     None         Luna Fuse, MD 04/19/21 1821

## 2021-04-19 NOTE — Discharge Instructions (Signed)
Call your primary care doctor or specialist as discussed in the next 2-3 days.   Return immediately back to the ER if:  Your symptoms worsen within the next 12-24 hours. You develop new symptoms such as new fevers, persistent vomiting, new pain, shortness of breath, or new weakness or numbness, or if you have any other concerns.  

## 2021-04-20 DIAGNOSIS — C50911 Malignant neoplasm of unspecified site of right female breast: Secondary | ICD-10-CM | POA: Diagnosis not present

## 2021-04-20 DIAGNOSIS — H409 Unspecified glaucoma: Secondary | ICD-10-CM | POA: Diagnosis not present

## 2021-04-20 DIAGNOSIS — K5909 Other constipation: Secondary | ICD-10-CM | POA: Diagnosis not present

## 2021-04-20 DIAGNOSIS — E039 Hypothyroidism, unspecified: Secondary | ICD-10-CM | POA: Diagnosis not present

## 2021-04-20 DIAGNOSIS — F32A Depression, unspecified: Secondary | ICD-10-CM | POA: Diagnosis not present

## 2021-04-20 DIAGNOSIS — N182 Chronic kidney disease, stage 2 (mild): Secondary | ICD-10-CM | POA: Diagnosis not present

## 2021-04-20 DIAGNOSIS — E785 Hyperlipidemia, unspecified: Secondary | ICD-10-CM | POA: Diagnosis not present

## 2021-04-20 DIAGNOSIS — E559 Vitamin D deficiency, unspecified: Secondary | ICD-10-CM | POA: Diagnosis not present

## 2021-04-20 DIAGNOSIS — I10 Essential (primary) hypertension: Secondary | ICD-10-CM | POA: Diagnosis not present

## 2021-04-20 DIAGNOSIS — F02A0 Dementia in other diseases classified elsewhere, mild, without behavioral disturbance, psychotic disturbance, mood disturbance, and anxiety: Secondary | ICD-10-CM | POA: Diagnosis not present

## 2021-04-20 DIAGNOSIS — Z8781 Personal history of (healed) traumatic fracture: Secondary | ICD-10-CM | POA: Diagnosis not present

## 2021-04-27 DIAGNOSIS — S32501D Unspecified fracture of right pubis, subsequent encounter for fracture with routine healing: Secondary | ICD-10-CM | POA: Diagnosis not present

## 2021-04-27 DIAGNOSIS — Z87891 Personal history of nicotine dependence: Secondary | ICD-10-CM | POA: Diagnosis not present

## 2021-04-27 DIAGNOSIS — I129 Hypertensive chronic kidney disease with stage 1 through stage 4 chronic kidney disease, or unspecified chronic kidney disease: Secondary | ICD-10-CM | POA: Diagnosis not present

## 2021-04-27 DIAGNOSIS — F03A0 Unspecified dementia, mild, without behavioral disturbance, psychotic disturbance, mood disturbance, and anxiety: Secondary | ICD-10-CM | POA: Diagnosis not present

## 2021-04-27 DIAGNOSIS — N182 Chronic kidney disease, stage 2 (mild): Secondary | ICD-10-CM | POA: Diagnosis not present

## 2021-04-27 DIAGNOSIS — E039 Hypothyroidism, unspecified: Secondary | ICD-10-CM | POA: Diagnosis not present

## 2021-04-27 DIAGNOSIS — H409 Unspecified glaucoma: Secondary | ICD-10-CM | POA: Diagnosis not present

## 2021-04-27 DIAGNOSIS — F32A Depression, unspecified: Secondary | ICD-10-CM | POA: Diagnosis not present

## 2021-04-27 DIAGNOSIS — D519 Vitamin B12 deficiency anemia, unspecified: Secondary | ICD-10-CM | POA: Diagnosis not present

## 2021-04-27 DIAGNOSIS — E559 Vitamin D deficiency, unspecified: Secondary | ICD-10-CM | POA: Diagnosis not present

## 2021-04-27 DIAGNOSIS — E785 Hyperlipidemia, unspecified: Secondary | ICD-10-CM | POA: Diagnosis not present

## 2021-04-27 DIAGNOSIS — Z853 Personal history of malignant neoplasm of breast: Secondary | ICD-10-CM | POA: Diagnosis not present

## 2021-04-27 DIAGNOSIS — R7303 Prediabetes: Secondary | ICD-10-CM | POA: Diagnosis not present

## 2021-04-27 DIAGNOSIS — E569 Vitamin deficiency, unspecified: Secondary | ICD-10-CM | POA: Diagnosis not present

## 2021-04-27 DIAGNOSIS — Z9181 History of falling: Secondary | ICD-10-CM | POA: Diagnosis not present

## 2021-04-29 NOTE — Progress Notes (Signed)
Patient Care Team: Lawerance Cruel, MD as PCP - General (Family Medicine)  DIAGNOSIS:    ICD-10-CM   1. Malignant neoplasm of lower-outer quadrant of right breast of female, estrogen receptor positive ([place])  C50.511    Z17.0       SUMMARY OF ONCOLOGIC HISTORY: Oncology History  Malignant neoplasm of lower-outer quadrant of right breast of female, estrogen receptor positive ([place])  01/07/2019 Initial Diagnosis   patient palpated a lump in the right breast and noticed skin dimpling. Diagnostic mammogram on 12/25/18 showed a 3cm distortion in the right breast at the 7 o'clock position. Right breast biopsy on 01/07/19 showed invasive mammary carcinoma, grade 1, HER-2 negative (1+), ER+ 90%, PR- 0%, Ki67 20%   01/22/2019 Cancer Staging   Staging form: Breast, AJCC 8th Edition - Clinical: Stage IIA (cT2, cN0, cM0, G1, ER+, PR-, HER2-) - Signed by Nicholas Lose, MD on 01/22/2019    01/22/2019 -  Neo-Adjuvant Anti-estrogen oral therapy   Anastrozole $RemoveBefo'1mg'[id number]$  daily     CHIEF COMPLIANT: Follow-up of right breast cancer  INTERVAL HISTORY: Kristin Bryant is a 88 y.o. with above-mentioned history of right breast cancer currently on neoadjuvant antiestrogen therapy with anastrozole. She presents to the clinic today for follow-up.  She is currently in assisted living facility after she fell multiple times and has broken her pelvis.  ALLERGIES:  is allergic to acetaminophen, aspirin, benzonatate, celexa [citalopram], chlorzoxazone, citalopram hydrobromide, cortisone, diclofenac-misoprostol, escitalopram oxalate, guaifenesin, guaifenesin & derivatives, hydrocodone, ipratropium bromide, lily of the valley herb [convallariae majalis], mirtazapine, naproxen, neosporin [bacitracin-polymyxin b], prednisone, pseudoephedrine, ranitidine, ranitidine hcl, sudafed [pseudoephedrine hcl], sulfa antibiotics, epinephrine, and latex.  MEDICATIONS:  Current Outpatient Medications  Medication Sig Dispense Refill    acetaminophen (TYLENOL) 650 MG CR tablet Take 650 mg by mouth every 8 (eight) hours as needed for pain.     anastrozole (ARIMIDEX) 1 MG tablet Take 1 tablet (1 mg total) by mouth daily. (Patient taking differently: Take 1 mg by mouth at bedtime.) 90 tablet 3   atorvastatin (LIPITOR) 40 MG tablet Take 40 mg by mouth daily.     benazepril (LOTENSIN) 20 MG tablet Take 20 mg by mouth daily.     bisacodyl (DULCOLAX) 5 MG EC tablet Take 1 tablet (5 mg total) by mouth daily as needed for moderate constipation (if no bowel movement with miralax and stool softner).     Calcium Citrate (CITRACAL PO) Take 2 tablets by mouth daily.     Cholecalciferol (VITAMIN D3) 5000 units CAPS Take 1 tablet by mouth daily.     DiphenhydrAMINE HCl (BENADRYL ALLERGY PO) Take 1 tablet by mouth as directed.     docusate sodium (COLACE) 100 MG capsule Take 1 capsule (100 mg total) by mouth every 12 (twelve) hours. 60 capsule 0   latanoprost (XALATAN) 0.005 % ophthalmic solution 1 drop at bedtime.     levothyroxine (SYNTHROID, LEVOTHROID) 100 MCG tablet Take 100 mcg by mouth daily before breakfast.     Multiple Vitamins-Minerals (CENTRUM SILVER 50+WOMEN PO) Take 1 tablet by mouth daily.     Multiple Vitamins-Minerals (PRESERVISION AREDS 2 PO) Take 1 capsule by mouth daily.     polyethylene glycol powder (GLYCOLAX/MIRALAX) 17 GM/SCOOP powder Mix 1 capful in a drink and take by mouth 1-3 times daily until daily soft stools  OTC 225 g 0   timolol (TIMOPTIC) 0.5 % ophthalmic solution Place 1 drop into both eyes 2 (two) times daily.     No current facility-administered medications  for this visit.    PHYSICAL EXAMINATION: ECOG PERFORMANCE STATUS: 1 - Symptomatic but completely ambulatory  Vitals:   05/01/21 1143  BP: 128/68  Pulse: 89  Resp: 16  Temp: (!) 97.3 F (36.3 C)  SpO2: 98%   Filed Weights    BREAST: No palpable masses or nodules in either right or left breasts. No palpable axillary supraclavicular or  infraclavicular adenopathy no breast tenderness or nipple discharge. (exam performed in the presence of a chaperone)  LABORATORY DATA:  I have reviewed the data as listed CMP Latest Ref Rng & Units 04/19/2021 03/14/2021 03/10/2021  Glucose 70 - 99 mg/dL 145(H) 135(H) 122(H)  BUN 8 - 23 mg/dL $Remove'19 14 11  'xxxmuYO$ Creatinine 0.44 - 1.00 mg/dL 0.83 0.60 0.58  Sodium 135 - 145 mmol/L 137 136 136  Potassium 3.5 - 5.1 mmol/L 4.3 4.2 3.5  Chloride 98 - 111 mmol/L 105 101 105  CO2 22 - 32 mmol/L $RemoveB'23 27 22  'sciqqfGI$ Calcium 8.9 - 10.3 mg/dL 9.7 8.9 8.6(L)  Total Protein 6.5 - 8.1 g/dL 6.9 - 6.6  Total Bilirubin 0.3 - 1.2 mg/dL 1.4(H) - 1.1  Alkaline Phos 38 - 126 U/L 99 - 61  AST 15 - 41 U/L 34 - 35  ALT 0 - 44 U/L 24 - 27    Lab Results  Component Value Date   WBC 10.0 04/19/2021   HGB 11.2 (L) 04/19/2021   HCT 32.7 (L) 04/19/2021   MCV 98.8 04/19/2021   PLT 253 04/19/2021   NEUTROABS 8.6 (H) 04/19/2021    ASSESSMENT & PLAN:  Malignant neoplasm of lower-outer quadrant of right breast of female, estrogen receptor positive ([place]) 01/07/2019:patient palpated a lump in the right breast and noticed skin dimpling. Diagnostic mammogram on 12/25/18 showed a 3cm distortion in the right breast at the 7 o'clock position. Right breast biopsy on 01/07/19 showed invasive mammary carcinoma, grade 1, HER-2 negative (1+), ER+ 90%, PR- 0%, Ki67 20% T2N0 stage IIa clinical stage   Current treatment: Palliative antiestrogen therapy with anastrozole 1 mg daily started 01/22/2019 Anastrozole toxicities: Tolerating it well Occasional hot flashes but tolerating it well. She has fallen and had fractured her pelvis.  She is currently in assisted living facility.   Breast cancer surveillance: 1.  Breast exam 05/01/2021: Benign 2. mammogram and ultrasound 05/13/2020: Masses in the right breast are more indistinct and difficult to measure.  Lesions are similar in size from prior study.  Lesions are smaller compared to the original study in  September 2020 No further mammograms.  I discussed with the patient that we can stop anastrozole at this time given that her performance status has declined. I would also not recommend any further mammograms. Return to clinic on an as-needed basis.   No orders of the defined types were placed in this encounter.  The patient has a good understanding of the overall plan. she agrees with it. she will call with any problems that may develop before the next visit here.  Total time spent: 20 mins including face to face time and time spent for planning, charting and coordination of care  Rulon Eisenmenger, MD, MPH 05/01/2021  I, Thana Ates, am acting as scribe for Dr. Nicholas Lose.  I have reviewed the above documentation for accuracy and completeness, and I agree with the above.

## 2021-05-01 ENCOUNTER — Inpatient Hospital Stay: Payer: Medicare Other | Attending: Hematology and Oncology | Admitting: Hematology and Oncology

## 2021-05-01 ENCOUNTER — Other Ambulatory Visit: Payer: Self-pay

## 2021-05-01 DIAGNOSIS — N951 Menopausal and female climacteric states: Secondary | ICD-10-CM | POA: Diagnosis not present

## 2021-05-01 DIAGNOSIS — Z79811 Long term (current) use of aromatase inhibitors: Secondary | ICD-10-CM | POA: Diagnosis not present

## 2021-05-01 DIAGNOSIS — Z170 Estrogen receptor positive status [ER+]: Secondary | ICD-10-CM | POA: Insufficient documentation

## 2021-05-01 DIAGNOSIS — C50511 Malignant neoplasm of lower-outer quadrant of right female breast: Secondary | ICD-10-CM | POA: Diagnosis not present

## 2021-05-01 DIAGNOSIS — Z79899 Other long term (current) drug therapy: Secondary | ICD-10-CM | POA: Insufficient documentation

## 2021-05-01 NOTE — Assessment & Plan Note (Signed)
01/07/2019:patient palpated a lump in the right breast and noticed skin dimpling. Diagnostic mammogram on 12/25/18 showed a 3cm distortion in the right breast at the 7 o'clock position. Right breast biopsy on 01/07/19 showed invasive mammary carcinoma, grade 1, HER-2 negative (1+), ER+ 90%, PR- 0%, Ki67 20% T2N0 stage IIa clinical stage  Current treatment: Palliative antiestrogen therapy with anastrozole 1 mg daily started 01/22/2019 Anastrozole toxicities:Tolerating it well Occasional hot flashes but tolerating it well. She does have a history of arthritis symptoms.  Breast cancer surveillance: 1.Breast exam 05/01/2021: Benign 2.mammogram and ultrasound 05/13/2020: Masses in the right breast are more indistinct and difficult to measure.  Lesions are similar in size from prior study.  Lesions are smaller compared to the original study in September 2020  Return to clinic in 1 year for follow-up

## 2021-05-02 DIAGNOSIS — S32501D Unspecified fracture of right pubis, subsequent encounter for fracture with routine healing: Secondary | ICD-10-CM | POA: Diagnosis not present

## 2021-05-02 DIAGNOSIS — I129 Hypertensive chronic kidney disease with stage 1 through stage 4 chronic kidney disease, or unspecified chronic kidney disease: Secondary | ICD-10-CM | POA: Diagnosis not present

## 2021-05-02 DIAGNOSIS — Z9181 History of falling: Secondary | ICD-10-CM | POA: Diagnosis not present

## 2021-05-02 DIAGNOSIS — F03A0 Unspecified dementia, mild, without behavioral disturbance, psychotic disturbance, mood disturbance, and anxiety: Secondary | ICD-10-CM | POA: Diagnosis not present

## 2021-05-02 DIAGNOSIS — H409 Unspecified glaucoma: Secondary | ICD-10-CM | POA: Diagnosis not present

## 2021-05-02 DIAGNOSIS — Z87891 Personal history of nicotine dependence: Secondary | ICD-10-CM | POA: Diagnosis not present

## 2021-05-02 DIAGNOSIS — E039 Hypothyroidism, unspecified: Secondary | ICD-10-CM | POA: Diagnosis not present

## 2021-05-02 DIAGNOSIS — E559 Vitamin D deficiency, unspecified: Secondary | ICD-10-CM | POA: Diagnosis not present

## 2021-05-02 DIAGNOSIS — N182 Chronic kidney disease, stage 2 (mild): Secondary | ICD-10-CM | POA: Diagnosis not present

## 2021-05-02 DIAGNOSIS — Z853 Personal history of malignant neoplasm of breast: Secondary | ICD-10-CM | POA: Diagnosis not present

## 2021-05-02 DIAGNOSIS — F32A Depression, unspecified: Secondary | ICD-10-CM | POA: Diagnosis not present

## 2021-05-02 DIAGNOSIS — E785 Hyperlipidemia, unspecified: Secondary | ICD-10-CM | POA: Diagnosis not present

## 2021-05-04 DIAGNOSIS — F03A0 Unspecified dementia, mild, without behavioral disturbance, psychotic disturbance, mood disturbance, and anxiety: Secondary | ICD-10-CM | POA: Diagnosis not present

## 2021-05-04 DIAGNOSIS — E785 Hyperlipidemia, unspecified: Secondary | ICD-10-CM | POA: Diagnosis not present

## 2021-05-04 DIAGNOSIS — Z853 Personal history of malignant neoplasm of breast: Secondary | ICD-10-CM | POA: Diagnosis not present

## 2021-05-04 DIAGNOSIS — N182 Chronic kidney disease, stage 2 (mild): Secondary | ICD-10-CM | POA: Diagnosis not present

## 2021-05-04 DIAGNOSIS — E039 Hypothyroidism, unspecified: Secondary | ICD-10-CM | POA: Diagnosis not present

## 2021-05-04 DIAGNOSIS — I10 Essential (primary) hypertension: Secondary | ICD-10-CM | POA: Diagnosis not present

## 2021-05-04 DIAGNOSIS — Z87891 Personal history of nicotine dependence: Secondary | ICD-10-CM | POA: Diagnosis not present

## 2021-05-04 DIAGNOSIS — I129 Hypertensive chronic kidney disease with stage 1 through stage 4 chronic kidney disease, or unspecified chronic kidney disease: Secondary | ICD-10-CM | POA: Diagnosis not present

## 2021-05-04 DIAGNOSIS — Z0189 Encounter for other specified special examinations: Secondary | ICD-10-CM | POA: Diagnosis not present

## 2021-05-04 DIAGNOSIS — F32A Depression, unspecified: Secondary | ICD-10-CM | POA: Diagnosis not present

## 2021-05-04 DIAGNOSIS — H409 Unspecified glaucoma: Secondary | ICD-10-CM | POA: Diagnosis not present

## 2021-05-04 DIAGNOSIS — E559 Vitamin D deficiency, unspecified: Secondary | ICD-10-CM | POA: Diagnosis not present

## 2021-05-04 DIAGNOSIS — Z9181 History of falling: Secondary | ICD-10-CM | POA: Diagnosis not present

## 2021-05-04 DIAGNOSIS — S32501D Unspecified fracture of right pubis, subsequent encounter for fracture with routine healing: Secondary | ICD-10-CM | POA: Diagnosis not present

## 2021-05-10 DIAGNOSIS — H409 Unspecified glaucoma: Secondary | ICD-10-CM | POA: Diagnosis not present

## 2021-05-10 DIAGNOSIS — F03A0 Unspecified dementia, mild, without behavioral disturbance, psychotic disturbance, mood disturbance, and anxiety: Secondary | ICD-10-CM | POA: Diagnosis not present

## 2021-05-10 DIAGNOSIS — Z87891 Personal history of nicotine dependence: Secondary | ICD-10-CM | POA: Diagnosis not present

## 2021-05-10 DIAGNOSIS — E039 Hypothyroidism, unspecified: Secondary | ICD-10-CM | POA: Diagnosis not present

## 2021-05-10 DIAGNOSIS — N182 Chronic kidney disease, stage 2 (mild): Secondary | ICD-10-CM | POA: Diagnosis not present

## 2021-05-10 DIAGNOSIS — I129 Hypertensive chronic kidney disease with stage 1 through stage 4 chronic kidney disease, or unspecified chronic kidney disease: Secondary | ICD-10-CM | POA: Diagnosis not present

## 2021-05-10 DIAGNOSIS — Z853 Personal history of malignant neoplasm of breast: Secondary | ICD-10-CM | POA: Diagnosis not present

## 2021-05-10 DIAGNOSIS — E785 Hyperlipidemia, unspecified: Secondary | ICD-10-CM | POA: Diagnosis not present

## 2021-05-10 DIAGNOSIS — E559 Vitamin D deficiency, unspecified: Secondary | ICD-10-CM | POA: Diagnosis not present

## 2021-05-10 DIAGNOSIS — F32A Depression, unspecified: Secondary | ICD-10-CM | POA: Diagnosis not present

## 2021-05-10 DIAGNOSIS — S32501D Unspecified fracture of right pubis, subsequent encounter for fracture with routine healing: Secondary | ICD-10-CM | POA: Diagnosis not present

## 2021-05-10 DIAGNOSIS — Z9181 History of falling: Secondary | ICD-10-CM | POA: Diagnosis not present

## 2021-05-12 DIAGNOSIS — E559 Vitamin D deficiency, unspecified: Secondary | ICD-10-CM | POA: Diagnosis not present

## 2021-05-12 DIAGNOSIS — I129 Hypertensive chronic kidney disease with stage 1 through stage 4 chronic kidney disease, or unspecified chronic kidney disease: Secondary | ICD-10-CM | POA: Diagnosis not present

## 2021-05-12 DIAGNOSIS — Z853 Personal history of malignant neoplasm of breast: Secondary | ICD-10-CM | POA: Diagnosis not present

## 2021-05-12 DIAGNOSIS — H409 Unspecified glaucoma: Secondary | ICD-10-CM | POA: Diagnosis not present

## 2021-05-12 DIAGNOSIS — S32501D Unspecified fracture of right pubis, subsequent encounter for fracture with routine healing: Secondary | ICD-10-CM | POA: Diagnosis not present

## 2021-05-12 DIAGNOSIS — F03A0 Unspecified dementia, mild, without behavioral disturbance, psychotic disturbance, mood disturbance, and anxiety: Secondary | ICD-10-CM | POA: Diagnosis not present

## 2021-05-12 DIAGNOSIS — E785 Hyperlipidemia, unspecified: Secondary | ICD-10-CM | POA: Diagnosis not present

## 2021-05-12 DIAGNOSIS — N182 Chronic kidney disease, stage 2 (mild): Secondary | ICD-10-CM | POA: Diagnosis not present

## 2021-05-12 DIAGNOSIS — F32A Depression, unspecified: Secondary | ICD-10-CM | POA: Diagnosis not present

## 2021-05-12 DIAGNOSIS — Z9181 History of falling: Secondary | ICD-10-CM | POA: Diagnosis not present

## 2021-05-12 DIAGNOSIS — E039 Hypothyroidism, unspecified: Secondary | ICD-10-CM | POA: Diagnosis not present

## 2021-05-12 DIAGNOSIS — Z87891 Personal history of nicotine dependence: Secondary | ICD-10-CM | POA: Diagnosis not present

## 2021-05-14 DIAGNOSIS — R531 Weakness: Secondary | ICD-10-CM | POA: Diagnosis not present

## 2021-05-15 DIAGNOSIS — E559 Vitamin D deficiency, unspecified: Secondary | ICD-10-CM | POA: Diagnosis not present

## 2021-05-15 DIAGNOSIS — Z9181 History of falling: Secondary | ICD-10-CM | POA: Diagnosis not present

## 2021-05-15 DIAGNOSIS — H409 Unspecified glaucoma: Secondary | ICD-10-CM | POA: Diagnosis not present

## 2021-05-15 DIAGNOSIS — Z87891 Personal history of nicotine dependence: Secondary | ICD-10-CM | POA: Diagnosis not present

## 2021-05-15 DIAGNOSIS — E039 Hypothyroidism, unspecified: Secondary | ICD-10-CM | POA: Diagnosis not present

## 2021-05-15 DIAGNOSIS — E785 Hyperlipidemia, unspecified: Secondary | ICD-10-CM | POA: Diagnosis not present

## 2021-05-15 DIAGNOSIS — N182 Chronic kidney disease, stage 2 (mild): Secondary | ICD-10-CM | POA: Diagnosis not present

## 2021-05-15 DIAGNOSIS — S32501D Unspecified fracture of right pubis, subsequent encounter for fracture with routine healing: Secondary | ICD-10-CM | POA: Diagnosis not present

## 2021-05-15 DIAGNOSIS — I129 Hypertensive chronic kidney disease with stage 1 through stage 4 chronic kidney disease, or unspecified chronic kidney disease: Secondary | ICD-10-CM | POA: Diagnosis not present

## 2021-05-15 DIAGNOSIS — F32A Depression, unspecified: Secondary | ICD-10-CM | POA: Diagnosis not present

## 2021-05-15 DIAGNOSIS — Z853 Personal history of malignant neoplasm of breast: Secondary | ICD-10-CM | POA: Diagnosis not present

## 2021-05-15 DIAGNOSIS — F03A0 Unspecified dementia, mild, without behavioral disturbance, psychotic disturbance, mood disturbance, and anxiety: Secondary | ICD-10-CM | POA: Diagnosis not present

## 2021-05-19 DIAGNOSIS — Z87891 Personal history of nicotine dependence: Secondary | ICD-10-CM | POA: Diagnosis not present

## 2021-05-19 DIAGNOSIS — E785 Hyperlipidemia, unspecified: Secondary | ICD-10-CM | POA: Diagnosis not present

## 2021-05-19 DIAGNOSIS — N182 Chronic kidney disease, stage 2 (mild): Secondary | ICD-10-CM | POA: Diagnosis not present

## 2021-05-19 DIAGNOSIS — E039 Hypothyroidism, unspecified: Secondary | ICD-10-CM | POA: Diagnosis not present

## 2021-05-19 DIAGNOSIS — F03A0 Unspecified dementia, mild, without behavioral disturbance, psychotic disturbance, mood disturbance, and anxiety: Secondary | ICD-10-CM | POA: Diagnosis not present

## 2021-05-19 DIAGNOSIS — I129 Hypertensive chronic kidney disease with stage 1 through stage 4 chronic kidney disease, or unspecified chronic kidney disease: Secondary | ICD-10-CM | POA: Diagnosis not present

## 2021-05-19 DIAGNOSIS — S32501D Unspecified fracture of right pubis, subsequent encounter for fracture with routine healing: Secondary | ICD-10-CM | POA: Diagnosis not present

## 2021-05-19 DIAGNOSIS — Z853 Personal history of malignant neoplasm of breast: Secondary | ICD-10-CM | POA: Diagnosis not present

## 2021-05-19 DIAGNOSIS — F32A Depression, unspecified: Secondary | ICD-10-CM | POA: Diagnosis not present

## 2021-05-19 DIAGNOSIS — E559 Vitamin D deficiency, unspecified: Secondary | ICD-10-CM | POA: Diagnosis not present

## 2021-05-19 DIAGNOSIS — Z9181 History of falling: Secondary | ICD-10-CM | POA: Diagnosis not present

## 2021-05-19 DIAGNOSIS — H409 Unspecified glaucoma: Secondary | ICD-10-CM | POA: Diagnosis not present

## 2021-05-25 DIAGNOSIS — H409 Unspecified glaucoma: Secondary | ICD-10-CM | POA: Diagnosis not present

## 2021-05-25 DIAGNOSIS — Z87891 Personal history of nicotine dependence: Secondary | ICD-10-CM | POA: Diagnosis not present

## 2021-05-25 DIAGNOSIS — F32A Depression, unspecified: Secondary | ICD-10-CM | POA: Diagnosis not present

## 2021-05-25 DIAGNOSIS — E785 Hyperlipidemia, unspecified: Secondary | ICD-10-CM | POA: Diagnosis not present

## 2021-05-25 DIAGNOSIS — E039 Hypothyroidism, unspecified: Secondary | ICD-10-CM | POA: Diagnosis not present

## 2021-05-25 DIAGNOSIS — E559 Vitamin D deficiency, unspecified: Secondary | ICD-10-CM | POA: Diagnosis not present

## 2021-05-25 DIAGNOSIS — Z853 Personal history of malignant neoplasm of breast: Secondary | ICD-10-CM | POA: Diagnosis not present

## 2021-05-25 DIAGNOSIS — Z9181 History of falling: Secondary | ICD-10-CM | POA: Diagnosis not present

## 2021-05-25 DIAGNOSIS — N182 Chronic kidney disease, stage 2 (mild): Secondary | ICD-10-CM | POA: Diagnosis not present

## 2021-05-25 DIAGNOSIS — S32501D Unspecified fracture of right pubis, subsequent encounter for fracture with routine healing: Secondary | ICD-10-CM | POA: Diagnosis not present

## 2021-05-25 DIAGNOSIS — I129 Hypertensive chronic kidney disease with stage 1 through stage 4 chronic kidney disease, or unspecified chronic kidney disease: Secondary | ICD-10-CM | POA: Diagnosis not present

## 2021-05-25 DIAGNOSIS — F03A0 Unspecified dementia, mild, without behavioral disturbance, psychotic disturbance, mood disturbance, and anxiety: Secondary | ICD-10-CM | POA: Diagnosis not present

## 2021-05-26 DIAGNOSIS — Z853 Personal history of malignant neoplasm of breast: Secondary | ICD-10-CM | POA: Diagnosis not present

## 2021-05-26 DIAGNOSIS — S32501D Unspecified fracture of right pubis, subsequent encounter for fracture with routine healing: Secondary | ICD-10-CM | POA: Diagnosis not present

## 2021-05-26 DIAGNOSIS — F03A0 Unspecified dementia, mild, without behavioral disturbance, psychotic disturbance, mood disturbance, and anxiety: Secondary | ICD-10-CM | POA: Diagnosis not present

## 2021-05-26 DIAGNOSIS — Z87891 Personal history of nicotine dependence: Secondary | ICD-10-CM | POA: Diagnosis not present

## 2021-05-26 DIAGNOSIS — F32A Depression, unspecified: Secondary | ICD-10-CM | POA: Diagnosis not present

## 2021-05-26 DIAGNOSIS — E039 Hypothyroidism, unspecified: Secondary | ICD-10-CM | POA: Diagnosis not present

## 2021-05-26 DIAGNOSIS — E785 Hyperlipidemia, unspecified: Secondary | ICD-10-CM | POA: Diagnosis not present

## 2021-05-26 DIAGNOSIS — N182 Chronic kidney disease, stage 2 (mild): Secondary | ICD-10-CM | POA: Diagnosis not present

## 2021-05-26 DIAGNOSIS — I129 Hypertensive chronic kidney disease with stage 1 through stage 4 chronic kidney disease, or unspecified chronic kidney disease: Secondary | ICD-10-CM | POA: Diagnosis not present

## 2021-05-26 DIAGNOSIS — Z9181 History of falling: Secondary | ICD-10-CM | POA: Diagnosis not present

## 2021-05-26 DIAGNOSIS — H409 Unspecified glaucoma: Secondary | ICD-10-CM | POA: Diagnosis not present

## 2021-05-26 DIAGNOSIS — E559 Vitamin D deficiency, unspecified: Secondary | ICD-10-CM | POA: Diagnosis not present

## 2021-05-31 DIAGNOSIS — E039 Hypothyroidism, unspecified: Secondary | ICD-10-CM | POA: Diagnosis not present

## 2021-05-31 DIAGNOSIS — S32501D Unspecified fracture of right pubis, subsequent encounter for fracture with routine healing: Secondary | ICD-10-CM | POA: Diagnosis not present

## 2021-05-31 DIAGNOSIS — E559 Vitamin D deficiency, unspecified: Secondary | ICD-10-CM | POA: Diagnosis not present

## 2021-05-31 DIAGNOSIS — F32A Depression, unspecified: Secondary | ICD-10-CM | POA: Diagnosis not present

## 2021-05-31 DIAGNOSIS — E785 Hyperlipidemia, unspecified: Secondary | ICD-10-CM | POA: Diagnosis not present

## 2021-05-31 DIAGNOSIS — I129 Hypertensive chronic kidney disease with stage 1 through stage 4 chronic kidney disease, or unspecified chronic kidney disease: Secondary | ICD-10-CM | POA: Diagnosis not present

## 2021-05-31 DIAGNOSIS — F03A0 Unspecified dementia, mild, without behavioral disturbance, psychotic disturbance, mood disturbance, and anxiety: Secondary | ICD-10-CM | POA: Diagnosis not present

## 2021-05-31 DIAGNOSIS — Z853 Personal history of malignant neoplasm of breast: Secondary | ICD-10-CM | POA: Diagnosis not present

## 2021-05-31 DIAGNOSIS — Z87891 Personal history of nicotine dependence: Secondary | ICD-10-CM | POA: Diagnosis not present

## 2021-05-31 DIAGNOSIS — Z9181 History of falling: Secondary | ICD-10-CM | POA: Diagnosis not present

## 2021-05-31 DIAGNOSIS — H409 Unspecified glaucoma: Secondary | ICD-10-CM | POA: Diagnosis not present

## 2021-05-31 DIAGNOSIS — N182 Chronic kidney disease, stage 2 (mild): Secondary | ICD-10-CM | POA: Diagnosis not present

## 2021-06-02 DIAGNOSIS — H409 Unspecified glaucoma: Secondary | ICD-10-CM | POA: Diagnosis not present

## 2021-06-02 DIAGNOSIS — S32501D Unspecified fracture of right pubis, subsequent encounter for fracture with routine healing: Secondary | ICD-10-CM | POA: Diagnosis not present

## 2021-06-02 DIAGNOSIS — Z9181 History of falling: Secondary | ICD-10-CM | POA: Diagnosis not present

## 2021-06-02 DIAGNOSIS — N182 Chronic kidney disease, stage 2 (mild): Secondary | ICD-10-CM | POA: Diagnosis not present

## 2021-06-02 DIAGNOSIS — Z853 Personal history of malignant neoplasm of breast: Secondary | ICD-10-CM | POA: Diagnosis not present

## 2021-06-02 DIAGNOSIS — F32A Depression, unspecified: Secondary | ICD-10-CM | POA: Diagnosis not present

## 2021-06-02 DIAGNOSIS — Z87891 Personal history of nicotine dependence: Secondary | ICD-10-CM | POA: Diagnosis not present

## 2021-06-02 DIAGNOSIS — E559 Vitamin D deficiency, unspecified: Secondary | ICD-10-CM | POA: Diagnosis not present

## 2021-06-02 DIAGNOSIS — F03A0 Unspecified dementia, mild, without behavioral disturbance, psychotic disturbance, mood disturbance, and anxiety: Secondary | ICD-10-CM | POA: Diagnosis not present

## 2021-06-02 DIAGNOSIS — E039 Hypothyroidism, unspecified: Secondary | ICD-10-CM | POA: Diagnosis not present

## 2021-06-02 DIAGNOSIS — E785 Hyperlipidemia, unspecified: Secondary | ICD-10-CM | POA: Diagnosis not present

## 2021-06-02 DIAGNOSIS — I129 Hypertensive chronic kidney disease with stage 1 through stage 4 chronic kidney disease, or unspecified chronic kidney disease: Secondary | ICD-10-CM | POA: Diagnosis not present

## 2021-06-05 DIAGNOSIS — N182 Chronic kidney disease, stage 2 (mild): Secondary | ICD-10-CM | POA: Diagnosis not present

## 2021-06-05 DIAGNOSIS — E559 Vitamin D deficiency, unspecified: Secondary | ICD-10-CM | POA: Diagnosis not present

## 2021-06-05 DIAGNOSIS — S32501D Unspecified fracture of right pubis, subsequent encounter for fracture with routine healing: Secondary | ICD-10-CM | POA: Diagnosis not present

## 2021-06-05 DIAGNOSIS — E039 Hypothyroidism, unspecified: Secondary | ICD-10-CM | POA: Diagnosis not present

## 2021-06-05 DIAGNOSIS — I129 Hypertensive chronic kidney disease with stage 1 through stage 4 chronic kidney disease, or unspecified chronic kidney disease: Secondary | ICD-10-CM | POA: Diagnosis not present

## 2021-06-05 DIAGNOSIS — F32A Depression, unspecified: Secondary | ICD-10-CM | POA: Diagnosis not present

## 2021-06-05 DIAGNOSIS — F03A0 Unspecified dementia, mild, without behavioral disturbance, psychotic disturbance, mood disturbance, and anxiety: Secondary | ICD-10-CM | POA: Diagnosis not present

## 2021-06-05 DIAGNOSIS — Z853 Personal history of malignant neoplasm of breast: Secondary | ICD-10-CM | POA: Diagnosis not present

## 2021-06-05 DIAGNOSIS — Z9181 History of falling: Secondary | ICD-10-CM | POA: Diagnosis not present

## 2021-06-05 DIAGNOSIS — H409 Unspecified glaucoma: Secondary | ICD-10-CM | POA: Diagnosis not present

## 2021-06-05 DIAGNOSIS — Z87891 Personal history of nicotine dependence: Secondary | ICD-10-CM | POA: Diagnosis not present

## 2021-06-05 DIAGNOSIS — E785 Hyperlipidemia, unspecified: Secondary | ICD-10-CM | POA: Diagnosis not present

## 2021-06-06 DIAGNOSIS — E039 Hypothyroidism, unspecified: Secondary | ICD-10-CM | POA: Diagnosis not present

## 2021-06-06 DIAGNOSIS — S32501D Unspecified fracture of right pubis, subsequent encounter for fracture with routine healing: Secondary | ICD-10-CM | POA: Diagnosis not present

## 2021-06-06 DIAGNOSIS — N182 Chronic kidney disease, stage 2 (mild): Secondary | ICD-10-CM | POA: Diagnosis not present

## 2021-06-06 DIAGNOSIS — E559 Vitamin D deficiency, unspecified: Secondary | ICD-10-CM | POA: Diagnosis not present

## 2021-06-06 DIAGNOSIS — E785 Hyperlipidemia, unspecified: Secondary | ICD-10-CM | POA: Diagnosis not present

## 2021-06-06 DIAGNOSIS — F32A Depression, unspecified: Secondary | ICD-10-CM | POA: Diagnosis not present

## 2021-06-06 DIAGNOSIS — Z87891 Personal history of nicotine dependence: Secondary | ICD-10-CM | POA: Diagnosis not present

## 2021-06-06 DIAGNOSIS — I129 Hypertensive chronic kidney disease with stage 1 through stage 4 chronic kidney disease, or unspecified chronic kidney disease: Secondary | ICD-10-CM | POA: Diagnosis not present

## 2021-06-06 DIAGNOSIS — Z9181 History of falling: Secondary | ICD-10-CM | POA: Diagnosis not present

## 2021-06-06 DIAGNOSIS — Z853 Personal history of malignant neoplasm of breast: Secondary | ICD-10-CM | POA: Diagnosis not present

## 2021-06-06 DIAGNOSIS — F03A0 Unspecified dementia, mild, without behavioral disturbance, psychotic disturbance, mood disturbance, and anxiety: Secondary | ICD-10-CM | POA: Diagnosis not present

## 2021-06-06 DIAGNOSIS — H409 Unspecified glaucoma: Secondary | ICD-10-CM | POA: Diagnosis not present

## 2021-06-11 DIAGNOSIS — R531 Weakness: Secondary | ICD-10-CM | POA: Diagnosis not present

## 2021-06-12 DIAGNOSIS — S32501D Unspecified fracture of right pubis, subsequent encounter for fracture with routine healing: Secondary | ICD-10-CM | POA: Diagnosis not present

## 2021-06-12 DIAGNOSIS — Z87891 Personal history of nicotine dependence: Secondary | ICD-10-CM | POA: Diagnosis not present

## 2021-06-12 DIAGNOSIS — H409 Unspecified glaucoma: Secondary | ICD-10-CM | POA: Diagnosis not present

## 2021-06-12 DIAGNOSIS — E785 Hyperlipidemia, unspecified: Secondary | ICD-10-CM | POA: Diagnosis not present

## 2021-06-12 DIAGNOSIS — Z853 Personal history of malignant neoplasm of breast: Secondary | ICD-10-CM | POA: Diagnosis not present

## 2021-06-12 DIAGNOSIS — I129 Hypertensive chronic kidney disease with stage 1 through stage 4 chronic kidney disease, or unspecified chronic kidney disease: Secondary | ICD-10-CM | POA: Diagnosis not present

## 2021-06-12 DIAGNOSIS — Z9181 History of falling: Secondary | ICD-10-CM | POA: Diagnosis not present

## 2021-06-12 DIAGNOSIS — E039 Hypothyroidism, unspecified: Secondary | ICD-10-CM | POA: Diagnosis not present

## 2021-06-12 DIAGNOSIS — F32A Depression, unspecified: Secondary | ICD-10-CM | POA: Diagnosis not present

## 2021-06-12 DIAGNOSIS — E559 Vitamin D deficiency, unspecified: Secondary | ICD-10-CM | POA: Diagnosis not present

## 2021-06-12 DIAGNOSIS — F03A0 Unspecified dementia, mild, without behavioral disturbance, psychotic disturbance, mood disturbance, and anxiety: Secondary | ICD-10-CM | POA: Diagnosis not present

## 2021-06-12 DIAGNOSIS — N182 Chronic kidney disease, stage 2 (mild): Secondary | ICD-10-CM | POA: Diagnosis not present

## 2021-06-13 DIAGNOSIS — F03A0 Unspecified dementia, mild, without behavioral disturbance, psychotic disturbance, mood disturbance, and anxiety: Secondary | ICD-10-CM | POA: Diagnosis not present

## 2021-06-13 DIAGNOSIS — E785 Hyperlipidemia, unspecified: Secondary | ICD-10-CM | POA: Diagnosis not present

## 2021-06-13 DIAGNOSIS — E039 Hypothyroidism, unspecified: Secondary | ICD-10-CM | POA: Diagnosis not present

## 2021-06-13 DIAGNOSIS — S32501D Unspecified fracture of right pubis, subsequent encounter for fracture with routine healing: Secondary | ICD-10-CM | POA: Diagnosis not present

## 2021-06-13 DIAGNOSIS — Z87891 Personal history of nicotine dependence: Secondary | ICD-10-CM | POA: Diagnosis not present

## 2021-06-13 DIAGNOSIS — Z9181 History of falling: Secondary | ICD-10-CM | POA: Diagnosis not present

## 2021-06-13 DIAGNOSIS — H409 Unspecified glaucoma: Secondary | ICD-10-CM | POA: Diagnosis not present

## 2021-06-13 DIAGNOSIS — F32A Depression, unspecified: Secondary | ICD-10-CM | POA: Diagnosis not present

## 2021-06-13 DIAGNOSIS — N182 Chronic kidney disease, stage 2 (mild): Secondary | ICD-10-CM | POA: Diagnosis not present

## 2021-06-13 DIAGNOSIS — Z853 Personal history of malignant neoplasm of breast: Secondary | ICD-10-CM | POA: Diagnosis not present

## 2021-06-13 DIAGNOSIS — E559 Vitamin D deficiency, unspecified: Secondary | ICD-10-CM | POA: Diagnosis not present

## 2021-06-13 DIAGNOSIS — I129 Hypertensive chronic kidney disease with stage 1 through stage 4 chronic kidney disease, or unspecified chronic kidney disease: Secondary | ICD-10-CM | POA: Diagnosis not present

## 2021-06-19 DIAGNOSIS — N182 Chronic kidney disease, stage 2 (mild): Secondary | ICD-10-CM | POA: Diagnosis not present

## 2021-06-19 DIAGNOSIS — E785 Hyperlipidemia, unspecified: Secondary | ICD-10-CM | POA: Diagnosis not present

## 2021-06-19 DIAGNOSIS — I129 Hypertensive chronic kidney disease with stage 1 through stage 4 chronic kidney disease, or unspecified chronic kidney disease: Secondary | ICD-10-CM | POA: Diagnosis not present

## 2021-06-19 DIAGNOSIS — F32A Depression, unspecified: Secondary | ICD-10-CM | POA: Diagnosis not present

## 2021-06-19 DIAGNOSIS — E039 Hypothyroidism, unspecified: Secondary | ICD-10-CM | POA: Diagnosis not present

## 2021-06-19 DIAGNOSIS — Z87891 Personal history of nicotine dependence: Secondary | ICD-10-CM | POA: Diagnosis not present

## 2021-06-19 DIAGNOSIS — Z9181 History of falling: Secondary | ICD-10-CM | POA: Diagnosis not present

## 2021-06-19 DIAGNOSIS — H409 Unspecified glaucoma: Secondary | ICD-10-CM | POA: Diagnosis not present

## 2021-06-19 DIAGNOSIS — F03A0 Unspecified dementia, mild, without behavioral disturbance, psychotic disturbance, mood disturbance, and anxiety: Secondary | ICD-10-CM | POA: Diagnosis not present

## 2021-06-19 DIAGNOSIS — Z853 Personal history of malignant neoplasm of breast: Secondary | ICD-10-CM | POA: Diagnosis not present

## 2021-06-19 DIAGNOSIS — S32501D Unspecified fracture of right pubis, subsequent encounter for fracture with routine healing: Secondary | ICD-10-CM | POA: Diagnosis not present

## 2021-06-19 DIAGNOSIS — E559 Vitamin D deficiency, unspecified: Secondary | ICD-10-CM | POA: Diagnosis not present

## 2021-06-22 DIAGNOSIS — E039 Hypothyroidism, unspecified: Secondary | ICD-10-CM | POA: Diagnosis not present

## 2021-07-07 DIAGNOSIS — M24871 Other specific joint derangements of right ankle, not elsewhere classified: Secondary | ICD-10-CM | POA: Diagnosis not present

## 2021-07-07 DIAGNOSIS — M65871 Other synovitis and tenosynovitis, right ankle and foot: Secondary | ICD-10-CM | POA: Diagnosis not present

## 2021-07-12 DIAGNOSIS — R531 Weakness: Secondary | ICD-10-CM | POA: Diagnosis not present

## 2021-08-05 DIAGNOSIS — Z9189 Other specified personal risk factors, not elsewhere classified: Secondary | ICD-10-CM | POA: Diagnosis not present

## 2021-08-05 DIAGNOSIS — E039 Hypothyroidism, unspecified: Secondary | ICD-10-CM | POA: Diagnosis not present

## 2021-08-05 DIAGNOSIS — M25471 Effusion, right ankle: Secondary | ICD-10-CM | POA: Diagnosis not present

## 2021-08-05 DIAGNOSIS — Z8781 Personal history of (healed) traumatic fracture: Secondary | ICD-10-CM | POA: Diagnosis not present

## 2021-08-11 DIAGNOSIS — R531 Weakness: Secondary | ICD-10-CM | POA: Diagnosis not present

## 2021-08-22 DIAGNOSIS — E039 Hypothyroidism, unspecified: Secondary | ICD-10-CM | POA: Diagnosis not present

## 2021-08-24 DIAGNOSIS — C50911 Malignant neoplasm of unspecified site of right female breast: Secondary | ICD-10-CM | POA: Diagnosis not present

## 2021-08-24 DIAGNOSIS — E039 Hypothyroidism, unspecified: Secondary | ICD-10-CM | POA: Diagnosis not present

## 2021-09-06 DIAGNOSIS — H35363 Drusen (degenerative) of macula, bilateral: Secondary | ICD-10-CM | POA: Diagnosis not present

## 2021-09-06 DIAGNOSIS — H35453 Secondary pigmentary degeneration, bilateral: Secondary | ICD-10-CM | POA: Diagnosis not present

## 2021-09-06 DIAGNOSIS — Z961 Presence of intraocular lens: Secondary | ICD-10-CM | POA: Diagnosis not present

## 2021-09-06 DIAGNOSIS — H3562 Retinal hemorrhage, left eye: Secondary | ICD-10-CM | POA: Diagnosis not present

## 2021-09-06 DIAGNOSIS — H353231 Exudative age-related macular degeneration, bilateral, with active choroidal neovascularization: Secondary | ICD-10-CM | POA: Diagnosis not present

## 2021-09-07 DIAGNOSIS — R4701 Aphasia: Secondary | ICD-10-CM | POA: Diagnosis not present

## 2021-09-07 DIAGNOSIS — R1310 Dysphagia, unspecified: Secondary | ICD-10-CM | POA: Diagnosis not present

## 2021-09-08 DIAGNOSIS — H353221 Exudative age-related macular degeneration, left eye, with active choroidal neovascularization: Secondary | ICD-10-CM | POA: Diagnosis not present

## 2021-09-11 DIAGNOSIS — R1310 Dysphagia, unspecified: Secondary | ICD-10-CM | POA: Diagnosis not present

## 2021-09-11 DIAGNOSIS — R531 Weakness: Secondary | ICD-10-CM | POA: Diagnosis not present

## 2021-09-11 DIAGNOSIS — R4701 Aphasia: Secondary | ICD-10-CM | POA: Diagnosis not present

## 2021-09-12 DIAGNOSIS — R1310 Dysphagia, unspecified: Secondary | ICD-10-CM | POA: Diagnosis not present

## 2021-09-12 DIAGNOSIS — R4701 Aphasia: Secondary | ICD-10-CM | POA: Diagnosis not present

## 2021-09-13 DIAGNOSIS — R1310 Dysphagia, unspecified: Secondary | ICD-10-CM | POA: Diagnosis not present

## 2021-09-13 DIAGNOSIS — R4701 Aphasia: Secondary | ICD-10-CM | POA: Diagnosis not present

## 2021-09-14 DIAGNOSIS — H353211 Exudative age-related macular degeneration, right eye, with active choroidal neovascularization: Secondary | ICD-10-CM | POA: Diagnosis not present

## 2021-09-15 DIAGNOSIS — R1310 Dysphagia, unspecified: Secondary | ICD-10-CM | POA: Diagnosis not present

## 2021-09-15 DIAGNOSIS — R4701 Aphasia: Secondary | ICD-10-CM | POA: Diagnosis not present

## 2021-09-18 DIAGNOSIS — R4701 Aphasia: Secondary | ICD-10-CM | POA: Diagnosis not present

## 2021-09-18 DIAGNOSIS — R1310 Dysphagia, unspecified: Secondary | ICD-10-CM | POA: Diagnosis not present

## 2021-09-19 DIAGNOSIS — R4701 Aphasia: Secondary | ICD-10-CM | POA: Diagnosis not present

## 2021-09-19 DIAGNOSIS — R1310 Dysphagia, unspecified: Secondary | ICD-10-CM | POA: Diagnosis not present

## 2021-09-20 DIAGNOSIS — R4701 Aphasia: Secondary | ICD-10-CM | POA: Diagnosis not present

## 2021-09-20 DIAGNOSIS — R1310 Dysphagia, unspecified: Secondary | ICD-10-CM | POA: Diagnosis not present

## 2021-09-21 DIAGNOSIS — R4701 Aphasia: Secondary | ICD-10-CM | POA: Diagnosis not present

## 2021-09-21 DIAGNOSIS — M6281 Muscle weakness (generalized): Secondary | ICD-10-CM | POA: Diagnosis not present

## 2021-09-21 DIAGNOSIS — R1310 Dysphagia, unspecified: Secondary | ICD-10-CM | POA: Diagnosis not present

## 2021-09-25 DIAGNOSIS — R1310 Dysphagia, unspecified: Secondary | ICD-10-CM | POA: Diagnosis not present

## 2021-09-25 DIAGNOSIS — R4701 Aphasia: Secondary | ICD-10-CM | POA: Diagnosis not present

## 2021-09-26 DIAGNOSIS — M6281 Muscle weakness (generalized): Secondary | ICD-10-CM | POA: Diagnosis not present

## 2021-09-27 DIAGNOSIS — R4701 Aphasia: Secondary | ICD-10-CM | POA: Diagnosis not present

## 2021-09-27 DIAGNOSIS — R1310 Dysphagia, unspecified: Secondary | ICD-10-CM | POA: Diagnosis not present

## 2021-09-28 DIAGNOSIS — M6281 Muscle weakness (generalized): Secondary | ICD-10-CM | POA: Diagnosis not present

## 2021-10-02 DIAGNOSIS — M6281 Muscle weakness (generalized): Secondary | ICD-10-CM | POA: Diagnosis not present

## 2021-10-02 DIAGNOSIS — R4701 Aphasia: Secondary | ICD-10-CM | POA: Diagnosis not present

## 2021-10-02 DIAGNOSIS — R1310 Dysphagia, unspecified: Secondary | ICD-10-CM | POA: Diagnosis not present

## 2021-10-03 DIAGNOSIS — R4701 Aphasia: Secondary | ICD-10-CM | POA: Diagnosis not present

## 2021-10-03 DIAGNOSIS — R1310 Dysphagia, unspecified: Secondary | ICD-10-CM | POA: Diagnosis not present

## 2021-10-05 DIAGNOSIS — R1310 Dysphagia, unspecified: Secondary | ICD-10-CM | POA: Diagnosis not present

## 2021-10-05 DIAGNOSIS — M6281 Muscle weakness (generalized): Secondary | ICD-10-CM | POA: Diagnosis not present

## 2021-10-05 DIAGNOSIS — R4701 Aphasia: Secondary | ICD-10-CM | POA: Diagnosis not present

## 2021-10-06 DIAGNOSIS — R1310 Dysphagia, unspecified: Secondary | ICD-10-CM | POA: Diagnosis not present

## 2021-10-06 DIAGNOSIS — R4701 Aphasia: Secondary | ICD-10-CM | POA: Diagnosis not present

## 2021-10-09 DIAGNOSIS — H353221 Exudative age-related macular degeneration, left eye, with active choroidal neovascularization: Secondary | ICD-10-CM | POA: Diagnosis not present

## 2021-10-09 DIAGNOSIS — M6281 Muscle weakness (generalized): Secondary | ICD-10-CM | POA: Diagnosis not present

## 2021-10-10 DIAGNOSIS — R1310 Dysphagia, unspecified: Secondary | ICD-10-CM | POA: Diagnosis not present

## 2021-10-10 DIAGNOSIS — R4701 Aphasia: Secondary | ICD-10-CM | POA: Diagnosis not present

## 2021-10-11 DIAGNOSIS — R531 Weakness: Secondary | ICD-10-CM | POA: Diagnosis not present

## 2021-10-11 DIAGNOSIS — M6281 Muscle weakness (generalized): Secondary | ICD-10-CM | POA: Diagnosis not present

## 2021-10-12 DIAGNOSIS — R1310 Dysphagia, unspecified: Secondary | ICD-10-CM | POA: Diagnosis not present

## 2021-10-12 DIAGNOSIS — R4701 Aphasia: Secondary | ICD-10-CM | POA: Diagnosis not present

## 2021-10-16 DIAGNOSIS — R4701 Aphasia: Secondary | ICD-10-CM | POA: Diagnosis not present

## 2021-10-16 DIAGNOSIS — R1310 Dysphagia, unspecified: Secondary | ICD-10-CM | POA: Diagnosis not present

## 2021-10-17 DIAGNOSIS — R4701 Aphasia: Secondary | ICD-10-CM | POA: Diagnosis not present

## 2021-10-17 DIAGNOSIS — R1310 Dysphagia, unspecified: Secondary | ICD-10-CM | POA: Diagnosis not present

## 2021-10-17 DIAGNOSIS — M6281 Muscle weakness (generalized): Secondary | ICD-10-CM | POA: Diagnosis not present

## 2021-10-18 DIAGNOSIS — R4701 Aphasia: Secondary | ICD-10-CM | POA: Diagnosis not present

## 2021-10-18 DIAGNOSIS — R1310 Dysphagia, unspecified: Secondary | ICD-10-CM | POA: Diagnosis not present

## 2021-10-18 DIAGNOSIS — M6281 Muscle weakness (generalized): Secondary | ICD-10-CM | POA: Diagnosis not present

## 2021-10-19 DIAGNOSIS — E039 Hypothyroidism, unspecified: Secondary | ICD-10-CM | POA: Diagnosis not present

## 2021-10-19 DIAGNOSIS — H353211 Exudative age-related macular degeneration, right eye, with active choroidal neovascularization: Secondary | ICD-10-CM | POA: Diagnosis not present

## 2021-10-20 DIAGNOSIS — R1310 Dysphagia, unspecified: Secondary | ICD-10-CM | POA: Diagnosis not present

## 2021-10-20 DIAGNOSIS — R4701 Aphasia: Secondary | ICD-10-CM | POA: Diagnosis not present

## 2021-10-23 DIAGNOSIS — R1310 Dysphagia, unspecified: Secondary | ICD-10-CM | POA: Diagnosis not present

## 2021-10-23 DIAGNOSIS — R4701 Aphasia: Secondary | ICD-10-CM | POA: Diagnosis not present

## 2021-10-24 DIAGNOSIS — R4701 Aphasia: Secondary | ICD-10-CM | POA: Diagnosis not present

## 2021-10-24 DIAGNOSIS — M6281 Muscle weakness (generalized): Secondary | ICD-10-CM | POA: Diagnosis not present

## 2021-10-24 DIAGNOSIS — R1310 Dysphagia, unspecified: Secondary | ICD-10-CM | POA: Diagnosis not present

## 2021-10-25 DIAGNOSIS — M6281 Muscle weakness (generalized): Secondary | ICD-10-CM | POA: Diagnosis not present

## 2021-10-26 DIAGNOSIS — R1310 Dysphagia, unspecified: Secondary | ICD-10-CM | POA: Diagnosis not present

## 2021-10-26 DIAGNOSIS — R4701 Aphasia: Secondary | ICD-10-CM | POA: Diagnosis not present

## 2021-10-30 DIAGNOSIS — M6281 Muscle weakness (generalized): Secondary | ICD-10-CM | POA: Diagnosis not present

## 2021-10-30 DIAGNOSIS — R1310 Dysphagia, unspecified: Secondary | ICD-10-CM | POA: Diagnosis not present

## 2021-10-30 DIAGNOSIS — R4701 Aphasia: Secondary | ICD-10-CM | POA: Diagnosis not present

## 2021-10-31 DIAGNOSIS — R4701 Aphasia: Secondary | ICD-10-CM | POA: Diagnosis not present

## 2021-10-31 DIAGNOSIS — R1310 Dysphagia, unspecified: Secondary | ICD-10-CM | POA: Diagnosis not present

## 2021-11-01 DIAGNOSIS — R4701 Aphasia: Secondary | ICD-10-CM | POA: Diagnosis not present

## 2021-11-01 DIAGNOSIS — R1310 Dysphagia, unspecified: Secondary | ICD-10-CM | POA: Diagnosis not present

## 2021-11-02 DIAGNOSIS — R1310 Dysphagia, unspecified: Secondary | ICD-10-CM | POA: Diagnosis not present

## 2021-11-02 DIAGNOSIS — R4701 Aphasia: Secondary | ICD-10-CM | POA: Diagnosis not present

## 2021-11-02 DIAGNOSIS — M6281 Muscle weakness (generalized): Secondary | ICD-10-CM | POA: Diagnosis not present

## 2021-11-03 DIAGNOSIS — R1310 Dysphagia, unspecified: Secondary | ICD-10-CM | POA: Diagnosis not present

## 2021-11-03 DIAGNOSIS — R4701 Aphasia: Secondary | ICD-10-CM | POA: Diagnosis not present

## 2021-11-06 DIAGNOSIS — M6281 Muscle weakness (generalized): Secondary | ICD-10-CM | POA: Diagnosis not present

## 2021-11-06 DIAGNOSIS — R4701 Aphasia: Secondary | ICD-10-CM | POA: Diagnosis not present

## 2021-11-06 DIAGNOSIS — R1310 Dysphagia, unspecified: Secondary | ICD-10-CM | POA: Diagnosis not present

## 2021-11-07 DIAGNOSIS — R4701 Aphasia: Secondary | ICD-10-CM | POA: Diagnosis not present

## 2021-11-07 DIAGNOSIS — R1310 Dysphagia, unspecified: Secondary | ICD-10-CM | POA: Diagnosis not present

## 2021-11-08 DIAGNOSIS — R4701 Aphasia: Secondary | ICD-10-CM | POA: Diagnosis not present

## 2021-11-08 DIAGNOSIS — R1310 Dysphagia, unspecified: Secondary | ICD-10-CM | POA: Diagnosis not present

## 2021-11-09 DIAGNOSIS — H353221 Exudative age-related macular degeneration, left eye, with active choroidal neovascularization: Secondary | ICD-10-CM | POA: Diagnosis not present

## 2021-11-09 DIAGNOSIS — R4701 Aphasia: Secondary | ICD-10-CM | POA: Diagnosis not present

## 2021-11-09 DIAGNOSIS — R1310 Dysphagia, unspecified: Secondary | ICD-10-CM | POA: Diagnosis not present

## 2021-11-09 DIAGNOSIS — M6281 Muscle weakness (generalized): Secondary | ICD-10-CM | POA: Diagnosis not present

## 2021-11-10 DIAGNOSIS — R1310 Dysphagia, unspecified: Secondary | ICD-10-CM | POA: Diagnosis not present

## 2021-11-10 DIAGNOSIS — R4701 Aphasia: Secondary | ICD-10-CM | POA: Diagnosis not present

## 2021-11-11 DIAGNOSIS — R531 Weakness: Secondary | ICD-10-CM | POA: Diagnosis not present

## 2021-11-13 DIAGNOSIS — M6281 Muscle weakness (generalized): Secondary | ICD-10-CM | POA: Diagnosis not present

## 2021-11-13 DIAGNOSIS — R4701 Aphasia: Secondary | ICD-10-CM | POA: Diagnosis not present

## 2021-11-13 DIAGNOSIS — R1310 Dysphagia, unspecified: Secondary | ICD-10-CM | POA: Diagnosis not present

## 2021-11-15 DIAGNOSIS — M6281 Muscle weakness (generalized): Secondary | ICD-10-CM | POA: Diagnosis not present

## 2021-11-15 DIAGNOSIS — R1310 Dysphagia, unspecified: Secondary | ICD-10-CM | POA: Diagnosis not present

## 2021-11-15 DIAGNOSIS — R4701 Aphasia: Secondary | ICD-10-CM | POA: Diagnosis not present

## 2021-11-16 DIAGNOSIS — R4701 Aphasia: Secondary | ICD-10-CM | POA: Diagnosis not present

## 2021-11-16 DIAGNOSIS — R1310 Dysphagia, unspecified: Secondary | ICD-10-CM | POA: Diagnosis not present

## 2021-11-17 DIAGNOSIS — R4701 Aphasia: Secondary | ICD-10-CM | POA: Diagnosis not present

## 2021-11-17 DIAGNOSIS — R1310 Dysphagia, unspecified: Secondary | ICD-10-CM | POA: Diagnosis not present

## 2021-11-20 DIAGNOSIS — H353211 Exudative age-related macular degeneration, right eye, with active choroidal neovascularization: Secondary | ICD-10-CM | POA: Diagnosis not present

## 2021-11-20 DIAGNOSIS — M6281 Muscle weakness (generalized): Secondary | ICD-10-CM | POA: Diagnosis not present

## 2021-11-21 DIAGNOSIS — R1310 Dysphagia, unspecified: Secondary | ICD-10-CM | POA: Diagnosis not present

## 2021-11-21 DIAGNOSIS — R4701 Aphasia: Secondary | ICD-10-CM | POA: Diagnosis not present

## 2021-11-22 DIAGNOSIS — M6281 Muscle weakness (generalized): Secondary | ICD-10-CM | POA: Diagnosis not present

## 2021-11-23 DIAGNOSIS — R4701 Aphasia: Secondary | ICD-10-CM | POA: Diagnosis not present

## 2021-11-23 DIAGNOSIS — R1310 Dysphagia, unspecified: Secondary | ICD-10-CM | POA: Diagnosis not present

## 2021-11-24 DIAGNOSIS — R4701 Aphasia: Secondary | ICD-10-CM | POA: Diagnosis not present

## 2021-11-24 DIAGNOSIS — R1310 Dysphagia, unspecified: Secondary | ICD-10-CM | POA: Diagnosis not present

## 2021-11-27 DIAGNOSIS — M6281 Muscle weakness (generalized): Secondary | ICD-10-CM | POA: Diagnosis not present

## 2021-11-28 DIAGNOSIS — R4701 Aphasia: Secondary | ICD-10-CM | POA: Diagnosis not present

## 2021-11-28 DIAGNOSIS — R1310 Dysphagia, unspecified: Secondary | ICD-10-CM | POA: Diagnosis not present

## 2021-11-29 DIAGNOSIS — R1310 Dysphagia, unspecified: Secondary | ICD-10-CM | POA: Diagnosis not present

## 2021-11-29 DIAGNOSIS — M6281 Muscle weakness (generalized): Secondary | ICD-10-CM | POA: Diagnosis not present

## 2021-11-29 DIAGNOSIS — R4701 Aphasia: Secondary | ICD-10-CM | POA: Diagnosis not present

## 2021-12-01 DIAGNOSIS — R1310 Dysphagia, unspecified: Secondary | ICD-10-CM | POA: Diagnosis not present

## 2021-12-01 DIAGNOSIS — R4701 Aphasia: Secondary | ICD-10-CM | POA: Diagnosis not present

## 2021-12-04 DIAGNOSIS — M6281 Muscle weakness (generalized): Secondary | ICD-10-CM | POA: Diagnosis not present

## 2021-12-05 DIAGNOSIS — R1310 Dysphagia, unspecified: Secondary | ICD-10-CM | POA: Diagnosis not present

## 2021-12-05 DIAGNOSIS — R4701 Aphasia: Secondary | ICD-10-CM | POA: Diagnosis not present

## 2021-12-06 DIAGNOSIS — R4701 Aphasia: Secondary | ICD-10-CM | POA: Diagnosis not present

## 2021-12-06 DIAGNOSIS — M6281 Muscle weakness (generalized): Secondary | ICD-10-CM | POA: Diagnosis not present

## 2021-12-06 DIAGNOSIS — R1310 Dysphagia, unspecified: Secondary | ICD-10-CM | POA: Diagnosis not present

## 2021-12-07 DIAGNOSIS — R1310 Dysphagia, unspecified: Secondary | ICD-10-CM | POA: Diagnosis not present

## 2021-12-07 DIAGNOSIS — R4701 Aphasia: Secondary | ICD-10-CM | POA: Diagnosis not present

## 2021-12-08 DIAGNOSIS — R1310 Dysphagia, unspecified: Secondary | ICD-10-CM | POA: Diagnosis not present

## 2021-12-08 DIAGNOSIS — R4701 Aphasia: Secondary | ICD-10-CM | POA: Diagnosis not present

## 2021-12-12 DIAGNOSIS — R531 Weakness: Secondary | ICD-10-CM | POA: Diagnosis not present

## 2021-12-12 DIAGNOSIS — M6281 Muscle weakness (generalized): Secondary | ICD-10-CM | POA: Diagnosis not present

## 2021-12-13 DIAGNOSIS — R1310 Dysphagia, unspecified: Secondary | ICD-10-CM | POA: Diagnosis not present

## 2021-12-13 DIAGNOSIS — R4701 Aphasia: Secondary | ICD-10-CM | POA: Diagnosis not present

## 2021-12-14 DIAGNOSIS — M6281 Muscle weakness (generalized): Secondary | ICD-10-CM | POA: Diagnosis not present

## 2021-12-15 DIAGNOSIS — R1310 Dysphagia, unspecified: Secondary | ICD-10-CM | POA: Diagnosis not present

## 2021-12-15 DIAGNOSIS — H353221 Exudative age-related macular degeneration, left eye, with active choroidal neovascularization: Secondary | ICD-10-CM | POA: Diagnosis not present

## 2021-12-15 DIAGNOSIS — R4701 Aphasia: Secondary | ICD-10-CM | POA: Diagnosis not present

## 2021-12-18 DIAGNOSIS — R1310 Dysphagia, unspecified: Secondary | ICD-10-CM | POA: Diagnosis not present

## 2021-12-18 DIAGNOSIS — M6281 Muscle weakness (generalized): Secondary | ICD-10-CM | POA: Diagnosis not present

## 2021-12-18 DIAGNOSIS — R4701 Aphasia: Secondary | ICD-10-CM | POA: Diagnosis not present

## 2021-12-19 DIAGNOSIS — R4701 Aphasia: Secondary | ICD-10-CM | POA: Diagnosis not present

## 2021-12-19 DIAGNOSIS — R1310 Dysphagia, unspecified: Secondary | ICD-10-CM | POA: Diagnosis not present

## 2021-12-20 DIAGNOSIS — R4701 Aphasia: Secondary | ICD-10-CM | POA: Diagnosis not present

## 2021-12-20 DIAGNOSIS — R1310 Dysphagia, unspecified: Secondary | ICD-10-CM | POA: Diagnosis not present

## 2021-12-21 DIAGNOSIS — R1310 Dysphagia, unspecified: Secondary | ICD-10-CM | POA: Diagnosis not present

## 2021-12-21 DIAGNOSIS — M6281 Muscle weakness (generalized): Secondary | ICD-10-CM | POA: Diagnosis not present

## 2021-12-21 DIAGNOSIS — R4701 Aphasia: Secondary | ICD-10-CM | POA: Diagnosis not present

## 2021-12-22 DIAGNOSIS — H353211 Exudative age-related macular degeneration, right eye, with active choroidal neovascularization: Secondary | ICD-10-CM | POA: Diagnosis not present

## 2021-12-25 DIAGNOSIS — R1310 Dysphagia, unspecified: Secondary | ICD-10-CM | POA: Diagnosis not present

## 2021-12-25 DIAGNOSIS — M6281 Muscle weakness (generalized): Secondary | ICD-10-CM | POA: Diagnosis not present

## 2021-12-25 DIAGNOSIS — R4701 Aphasia: Secondary | ICD-10-CM | POA: Diagnosis not present

## 2021-12-26 DIAGNOSIS — R4701 Aphasia: Secondary | ICD-10-CM | POA: Diagnosis not present

## 2021-12-26 DIAGNOSIS — R1310 Dysphagia, unspecified: Secondary | ICD-10-CM | POA: Diagnosis not present

## 2021-12-27 DIAGNOSIS — M6281 Muscle weakness (generalized): Secondary | ICD-10-CM | POA: Diagnosis not present

## 2021-12-28 DIAGNOSIS — R1310 Dysphagia, unspecified: Secondary | ICD-10-CM | POA: Diagnosis not present

## 2021-12-28 DIAGNOSIS — R4701 Aphasia: Secondary | ICD-10-CM | POA: Diagnosis not present

## 2022-01-01 DIAGNOSIS — M6281 Muscle weakness (generalized): Secondary | ICD-10-CM | POA: Diagnosis not present

## 2022-01-02 DIAGNOSIS — R4701 Aphasia: Secondary | ICD-10-CM | POA: Diagnosis not present

## 2022-01-02 DIAGNOSIS — R1310 Dysphagia, unspecified: Secondary | ICD-10-CM | POA: Diagnosis not present

## 2022-01-02 DIAGNOSIS — M6281 Muscle weakness (generalized): Secondary | ICD-10-CM | POA: Diagnosis not present

## 2022-01-03 DIAGNOSIS — R4701 Aphasia: Secondary | ICD-10-CM | POA: Diagnosis not present

## 2022-01-03 DIAGNOSIS — R1310 Dysphagia, unspecified: Secondary | ICD-10-CM | POA: Diagnosis not present

## 2022-01-07 ENCOUNTER — Emergency Department (HOSPITAL_COMMUNITY): Payer: Medicare Other

## 2022-01-07 ENCOUNTER — Encounter (HOSPITAL_COMMUNITY): Payer: Self-pay | Admitting: Emergency Medicine

## 2022-01-07 ENCOUNTER — Other Ambulatory Visit: Payer: Self-pay

## 2022-01-07 ENCOUNTER — Emergency Department (HOSPITAL_COMMUNITY)
Admission: EM | Admit: 2022-01-07 | Discharge: 2022-01-08 | Disposition: A | Discharge status: Home / Self Care | Payer: Medicare Other | Attending: Emergency Medicine | Admitting: Emergency Medicine

## 2022-01-07 DIAGNOSIS — S7001XA Contusion of right hip, initial encounter: Secondary | ICD-10-CM | POA: Diagnosis not present

## 2022-01-07 DIAGNOSIS — M25551 Pain in right hip: Secondary | ICD-10-CM | POA: Insufficient documentation

## 2022-01-07 DIAGNOSIS — S24109A Unspecified injury at unspecified level of thoracic spinal cord, initial encounter: Secondary | ICD-10-CM | POA: Diagnosis present

## 2022-01-07 DIAGNOSIS — Z743 Need for continuous supervision: Secondary | ICD-10-CM | POA: Diagnosis not present

## 2022-01-07 DIAGNOSIS — S22080A Wedge compression fracture of T11-T12 vertebra, initial encounter for closed fracture: Secondary | ICD-10-CM | POA: Insufficient documentation

## 2022-01-07 DIAGNOSIS — W19XXXA Unspecified fall, initial encounter: Secondary | ICD-10-CM | POA: Diagnosis not present

## 2022-01-07 DIAGNOSIS — M4316 Spondylolisthesis, lumbar region: Secondary | ICD-10-CM | POA: Diagnosis not present

## 2022-01-07 DIAGNOSIS — S32501A Unspecified fracture of right pubis, initial encounter for closed fracture: Secondary | ICD-10-CM | POA: Diagnosis not present

## 2022-01-07 DIAGNOSIS — M545 Low back pain, unspecified: Secondary | ICD-10-CM | POA: Diagnosis not present

## 2022-01-07 DIAGNOSIS — M47816 Spondylosis without myelopathy or radiculopathy, lumbar region: Secondary | ICD-10-CM | POA: Diagnosis not present

## 2022-01-07 DIAGNOSIS — T148XXA Other injury of unspecified body region, initial encounter: Secondary | ICD-10-CM | POA: Diagnosis not present

## 2022-01-07 DIAGNOSIS — Z91040 Latex allergy status: Secondary | ICD-10-CM | POA: Diagnosis not present

## 2022-01-07 DIAGNOSIS — M5489 Other dorsalgia: Secondary | ICD-10-CM | POA: Diagnosis not present

## 2022-01-07 NOTE — ED Provider Triage Note (Signed)
Emergency Medicine Provider Triage Evaluation Note  Kristin Bryant , a 87 y.o. female  was evaluated in triage.  Pt complains of low back and right hip pain after fall 2 days ago. Lives at [facility]. Not on blood thinners  Review of Systems  Positive: As above Negative: Head trauma, LOC, numbness  Physical Exam  BP (!) 94/57   Pulse 89   Temp 98.5 F (36.9 C) (Oral)   Resp 18   SpO2 93%  Gen:   Awake, no distress   Resp:  Normal effort  MSK:   Moves extremities without difficulty  Other:    Medical Decision Making  Medically screening exam initiated at 10:02 PM.  Appropriate orders placed.  [facility]EP was informed that the remainder of the evaluation will be completed by another provider, this initial triage assessment does not replace that evaluation, and the importance of remaining in the ED until their evaluation is complete.     Kateri Plummer, PA-C 01/07/22 2202

## 2022-01-07 NOTE — ED Triage Notes (Signed)
Pt here from nursing home ( [facility] ) fell 2 days ago , c/o low back pain and right hip pain., no loc , no blood thinners

## 2022-01-08 DIAGNOSIS — M6281 Muscle weakness (generalized): Secondary | ICD-10-CM | POA: Diagnosis not present

## 2022-01-08 DIAGNOSIS — R1310 Dysphagia, unspecified: Secondary | ICD-10-CM | POA: Diagnosis not present

## 2022-01-08 DIAGNOSIS — R4701 Aphasia: Secondary | ICD-10-CM | POA: Diagnosis not present

## 2022-01-08 MED ORDER — TRAMADOL HCL 50 MG PO TABS
50.0000 mg | ORAL_TABLET | Freq: Once | ORAL | Status: AC
  Administered 2022-01-08: 50 mg via ORAL
  Filled 2022-01-08: qty 1

## 2022-01-08 MED ORDER — HYDROCODONE-ACETAMINOPHEN 5-325 MG PO TABS: 1.0000 | ORAL_TABLET | Freq: Four times a day (QID) | ORAL | 0 refills | Status: DC | PRN

## 2022-01-08 NOTE — ED Provider Notes (Signed)
[facility] DEPT Provider Note   CSN: [id number] Arrival date & time: 01/07/22  2126     History  No chief complaint on file.   Kristin Bryant is a 88 y.o. female.  Patient is a 88 year old female brought for evaluation of low back pain.  Patient reports falling 2 days ago and injuring her back and right hip.  She has had difficulty ambulating since.  She denies any numbness or tingling.  She was given Tylenol yesterday which did seem to help, however there was no order for this today at her facility so was not given.  She denies any bowel or bladder complaints.  Pain worse when she sits forward and attempts to ambulate.  The history is provided by the patient.       Home Medications Prior to Admission medications   Medication Sig Start Date End Date Taking? Authorizing Provider  acetaminophen (TYLENOL) 650 MG CR tablet Take 650 mg by mouth every 8 (eight) hours as needed for pain.   Yes [provider]  benazepril (LOTENSIN) 20 MG tablet Take 20 mg by mouth daily.   Yes [provider]  bisacodyl (DULCOLAX) 5 MG EC tablet Take 1 tablet (5 mg total) by mouth daily as needed for moderate constipation (if no bowel movement with miralax and stool softner). 03/14/21 03/14/22 Yes Pokhrel, Laxman, MD  Cholecalciferol (VITAMIN D-3) 125 MCG (5000 UT) TABS Take 1 tablet by mouth daily.   Yes [provider]  DiphenhydrAMINE HCl (BENADRYL ALLERGY PO) Take 25 mg by mouth every 8 (eight) hours as needed (allergies).   Yes [provider]  docusate sodium (COLACE) 100 MG capsule Take 1 capsule (100 mg total) by mouth every 12 (twelve) hours. 07/29/20  Yes Little, Wenda Overland, MD  levothyroxine (SYNTHROID) 88 MCG tablet Take 88 mcg by mouth daily. 12/08/21  Yes [provider]  lidocaine 4 % Place 1 patch onto the skin daily.   Yes [provider]  Multiple Vitamins-Minerals (PRESERVISION AREDS 2 PO) Take 1 capsule by  mouth daily.   Yes [provider]  ondansetron (ZOFRAN-ODT) 4 MG disintegrating tablet Take 4 mg by mouth every 8 (eight) hours as needed for nausea or vomiting.   Yes [provider]  polyethylene glycol powder (GLYCOLAX/MIRALAX) 17 GM/SCOOP powder Mix 1 capful in a drink and take by mouth 1-3 times daily until daily soft stools  OTC Patient taking differently: Take 0.5 Containers by mouth in the morning and at bedtime. 07/29/20  Yes Little, Wenda Overland, MD  QUEtiapine (SEROQUEL) 25 MG tablet Take 25 mg by mouth at bedtime. 12/08/21  Yes [provider]  sertraline (ZOLOFT) 25 MG tablet Take 25 mg by mouth daily. 12/08/21  Yes [provider]  timolol (TIMOPTIC) 0.5 % ophthalmic solution Place 1 drop into both eyes 2 (two) times daily.   Yes [provider]  traMADol (ULTRAM) 50 MG tablet Take 25 mg by mouth daily. 12/11/21  Yes [provider]  anastrozole (ARIMIDEX) 1 MG tablet Take 1 tablet (1 mg total) by mouth daily. Patient not taking: Reported on 01/08/2022 04/29/20   Nicholas Lose, MD      Allergies    Acetaminophen, Aspirin, Benzonatate, Celexa [citalopram], Chlorzoxazone, Citalopram hydrobromide, Cortisone, Diclofenac-misoprostol, Escitalopram oxalate, Guaifenesin, Guaifenesin & derivatives, Hydrocodone, Ipratropium bromide, [place] herb [convallariae majalis], Mirtazapine, Naproxen, Neosporin [bacitracin-polymyxin b], Prednisone, Pseudoephedrine, Ranitidine, Ranitidine hcl, Sudafed [pseudoephedrine hcl], Sulfa antibiotics, Epinephrine, and Latex    Review  of Systems   Review of Systems  All other systems reviewed and are negative.   Physical Exam Updated Vital Signs BP (!) 91/51   Pulse 85   Temp 98.5 F (36.9 C) (Oral)   Resp 16   SpO2 92%  Physical Exam Vitals and nursing note reviewed.  Constitutional:      General: She is not in acute distress.    Appearance: Normal appearance. She is not ill-appearing.   HENT:     Head: Normocephalic and atraumatic.  Pulmonary:     Effort: Pulmonary effort is normal.  Musculoskeletal:     Comments: There is tenderness to palpation in the soft tissues of the upper lumbar/lower thoracic spine.  There is no bony tenderness or step-off.  Skin:    General: Skin is warm and dry.  Neurological:     General: No focal deficit present.     Mental Status: She is alert and oriented to person, place, and time.     Comments: Strength is 5 out of 5 in both lower extremities.  Sensation is intact throughout both lower extremities.     ED Results / Procedures / Treatments   Labs (all labs ordered are listed, but only abnormal results are displayed) Labs Reviewed - No data to display  EKG None  Radiology CT Lumbar Spine Wo Contrast  Result Date: 01/07/2022 CLINICAL DATA:  Fall 2 days ago low back pain and right hip pain EXAM: CT LUMBAR SPINE WITHOUT CONTRAST TECHNIQUE: Multidetector CT imaging of the lumbar spine was performed without intravenous contrast administration. Multiplanar CT image reconstructions were also generated. RADIATION DOSE REDUCTION: This exam was performed according to the departmental dose-optimization program which includes automated exposure control, adjustment of the mA and/or kV according to patient size and/or use of iterative reconstruction technique. COMPARISON:  No prior CT. FINDINGS: Segmentation: 5 lumbar type vertebrae. Alignment: Levocurvature of the upper lumbar spine with compensatory dextrocurvature lower lumbar spine. Straightening of the normal lumbar lordosis. 3 mm anterolisthesis of L4 on L5. 5 mm left lateral listhesis of L3 on L4 and 5 mm right lateral listhesis of L4 on L5. Vertebrae: Compression deformity of T12, which is concerning for an acute fracture with approximately 40% vertebral body height loss anteriorly. 2 mm retropulsion of the posterosuperior cortex, somewhat eccentric to the left. No evidence of extension into the  pedicles or posterior elements. Vertebral body heights are otherwise preserved. Endplate degenerative changes L2-L3, eccentric to the right, and at L4-L5 and L5-S1, eccentric to the left. Paraspinal and other soft tissues: Aortic atherosclerosis. Fluid density mass extending from the superior left kidney, favored to represent a renal cyst. No lymphadenopathy. Disc levels: T11-T12: No significant disc bulge. Mild facet arthropathy. No spinal canal stenosis or neural foraminal narrowing. T12-L1: No significant disc bulge. No spinal canal stenosis or neural foraminal narrowing. L1-L2: No significant disc bulge. No spinal canal stenosis or neural foraminal narrowing. L2-L3: Disc height loss and moderate broad-based disc bulge. Mild-to-moderate facet arthropathy. Ligamentum flavum hypertrophy. Moderate spinal canal stenosis. Moderate right and mild left neural foraminal narrowing. L3-L4: Mild disc bulge. Mild-to-moderate facet arthropathy. Ligamentum flavum hypertrophy. Mild-to-moderate spinal canal stenosis. Mild bilateral neural foraminal narrowing. L4-L5: Prior laminectomy. Disc height loss and mild disc bulge. No spinal canal stenosis. Mild bilateral neural foraminal narrowing. L5-S1: Disc height loss and mild disc bulge. Moderate facet arthropathy. No spinal canal stenosis. Moderate left and mild right neural foraminal narrowing. IMPRESSION: 1. Compression deformity of T12, favored to be an acute fracture,  with approximately 40% vertebral body height loss anteriorly. 2 mm retropulsion of the posterosuperior cortex, somewhat eccentric to the left, but no significant spinal canal stenosis. This could be further evaluated with MRI if clinically indicated. 2. L2-L3 moderate spinal canal stenosis with moderate right and mild left neural foraminal narrowing. 3. L3-L4 mild-to-moderate spinal canal stenosis and mild bilateral neural foraminal narrowing. 4. L5-S1 moderate left and mild right neural foraminal narrowing. 5.  L4-L5 mild bilateral neural foraminal narrowing. Electronically Signed   By: Merilyn Baba M.D.   On: 01/07/2022 23:41   CT PELVIS WO CONTRAST  Result Date: 01/07/2022 CLINICAL DATA:  Fall 2 days ago, right hip pain EXAM: CT PELVIS WITHOUT CONTRAST TECHNIQUE: Multidetector CT imaging of the pelvis was performed following the standard protocol without intravenous contrast. RADIATION DOSE REDUCTION: This exam was performed according to the departmental dose-optimization program which includes automated exposure control, adjustment of the mA and/or kV according to patient size and/or use of iterative reconstruction technique. COMPARISON:  Right hip radiographs dated 03/10/2021 FINDINGS: Urinary Tract:  Bladder is underdistended but grossly unremarkable. Bowel:  Visualized bowel is grossly unremarkable. Vascular/Lymphatic: No evidence of aneurysm. Atherosclerotic calcifications. No suspicious pelvic lymphadenopathy. Reproductive:  Status post hysterectomy. No adnexal masses. Other:  No pelvic ascites. Musculoskeletal: No fracture is seen. Specifically, the right hip is intact. Mild degenerative changes of the lower lumbar spine. Old right inferior pubic ramus fracture deformity. IMPRESSION: No fracture is seen. Specifically, the right hip is intact. Electronically Signed   By: Julian Hy M.D.   On: 01/07/2022 23:34    Procedures Procedures    Medications Ordered in ED Medications  traMADol (ULTRAM) tablet 50 mg (has no administration in time range)    ED Course/ Medical Decision Making/ A&P  Patient is a 88 year old female presenting with back pain after a fall.  CT scan was obtained of the pelvis and lumbar spine showing a T12 compression fracture with a 40% loss of height, but no retropulsion or canal stenosis.  Patient is neurovascularly intact.  Pain was treated with tramadol here in the ER.  She was able to ambulate using a walker and I feel can safely be discharged back to her assisted  living facility.  To follow-up as needed.  We will be given hydrocodone which she can take as needed for pain.  Final Clinical Impression(s) / ED Diagnoses Final diagnoses:  None    Rx / DC Orders ED Discharge Orders     None         Veryl Speak, MD 01/08/22 [phone]

## 2022-01-08 NOTE — Discharge Instructions (Signed)
Begin taking hydrocodone as prescribed as needed for pain.  Do not take this medication simultaneously with tramadol, but instead of.  Follow-up with primary doctor if not improving in the next 1 to 2 weeks.

## 2022-01-09 DIAGNOSIS — M6281 Muscle weakness (generalized): Secondary | ICD-10-CM | POA: Diagnosis not present

## 2022-01-11 DIAGNOSIS — R531 Weakness: Secondary | ICD-10-CM | POA: Diagnosis not present

## 2022-01-12 ENCOUNTER — Emergency Department (HOSPITAL_COMMUNITY)
Admission: EM | Admit: 2022-01-12 | Discharge: 2022-01-12 | Disposition: A | Discharge status: Skilled Nursing Facility | Payer: Medicare Other | Attending: Emergency Medicine | Admitting: Emergency Medicine

## 2022-01-12 ENCOUNTER — Emergency Department (HOSPITAL_COMMUNITY): Payer: Medicare Other

## 2022-01-12 DIAGNOSIS — R4701 Aphasia: Secondary | ICD-10-CM | POA: Diagnosis not present

## 2022-01-12 DIAGNOSIS — M47812 Spondylosis without myelopathy or radiculopathy, cervical region: Secondary | ICD-10-CM | POA: Diagnosis not present

## 2022-01-12 DIAGNOSIS — N390 Urinary tract infection, site not specified: Secondary | ICD-10-CM | POA: Insufficient documentation

## 2022-01-12 DIAGNOSIS — Z91040 Latex allergy status: Secondary | ICD-10-CM | POA: Diagnosis not present

## 2022-01-12 DIAGNOSIS — M4692 Unspecified inflammatory spondylopathy, cervical region: Secondary | ICD-10-CM | POA: Insufficient documentation

## 2022-01-12 DIAGNOSIS — W19XXXA Unspecified fall, initial encounter: Secondary | ICD-10-CM | POA: Diagnosis not present

## 2022-01-12 DIAGNOSIS — R0902 Hypoxemia: Secondary | ICD-10-CM | POA: Diagnosis not present

## 2022-01-12 DIAGNOSIS — Z743 Need for continuous supervision: Secondary | ICD-10-CM | POA: Diagnosis not present

## 2022-01-12 DIAGNOSIS — M546 Pain in thoracic spine: Secondary | ICD-10-CM | POA: Diagnosis not present

## 2022-01-12 DIAGNOSIS — Z79899 Other long term (current) drug therapy: Secondary | ICD-10-CM | POA: Diagnosis not present

## 2022-01-12 DIAGNOSIS — I10 Essential (primary) hypertension: Secondary | ICD-10-CM | POA: Diagnosis not present

## 2022-01-12 DIAGNOSIS — M4802 Spinal stenosis, cervical region: Secondary | ICD-10-CM | POA: Insufficient documentation

## 2022-01-12 DIAGNOSIS — R1310 Dysphagia, unspecified: Secondary | ICD-10-CM | POA: Diagnosis not present

## 2022-01-12 DIAGNOSIS — M545 Low back pain, unspecified: Secondary | ICD-10-CM | POA: Diagnosis not present

## 2022-01-12 DIAGNOSIS — S32501A Unspecified fracture of right pubis, initial encounter for closed fracture: Secondary | ICD-10-CM | POA: Diagnosis not present

## 2022-01-12 DIAGNOSIS — R410 Disorientation, unspecified: Secondary | ICD-10-CM | POA: Diagnosis not present

## 2022-01-12 DIAGNOSIS — S0990XA Unspecified injury of head, initial encounter: Secondary | ICD-10-CM | POA: Insufficient documentation

## 2022-01-12 DIAGNOSIS — R6889 Other general symptoms and signs: Secondary | ICD-10-CM | POA: Diagnosis not present

## 2022-01-12 DIAGNOSIS — S22080A Wedge compression fracture of T11-T12 vertebra, initial encounter for closed fracture: Secondary | ICD-10-CM | POA: Diagnosis not present

## 2022-01-12 DIAGNOSIS — S199XXA Unspecified injury of neck, initial encounter: Secondary | ICD-10-CM | POA: Diagnosis not present

## 2022-01-12 DIAGNOSIS — M549 Dorsalgia, unspecified: Secondary | ICD-10-CM | POA: Diagnosis present

## 2022-01-12 DIAGNOSIS — S79911A Unspecified injury of right hip, initial encounter: Secondary | ICD-10-CM | POA: Diagnosis not present

## 2022-01-12 LAB — URINALYSIS, ROUTINE W REFLEX MICROSCOPIC
Bilirubin Urine: NEGATIVE
Glucose, UA: NEGATIVE mg/dL
Hgb urine dipstick: NEGATIVE
Ketones, ur: NEGATIVE mg/dL
Nitrite: POSITIVE — AB
Protein, ur: 30 mg/dL — AB
Specific Gravity, Urine: 1.01 (ref 1.005–1.030)
WBC, UA: >50 WBC/hpf — ABNORMAL HIGH (ref 0–5)
pH: 6 (ref 5.0–8.0)

## 2022-01-12 MED ORDER — CEFTRIAXONE SODIUM 1 G IJ SOLR
500.0000 mg | Freq: Once | INTRAMUSCULAR | Status: AC
  Administered 2022-01-12: 500 mg via INTRAMUSCULAR
  Filled 2022-01-12: qty 10

## 2022-01-12 MED ORDER — ACETAMINOPHEN 325 MG PO TABS
650.0000 mg | ORAL_TABLET | Freq: Once | ORAL | Status: AC
  Administered 2022-01-12: 650 mg via ORAL
  Filled 2022-01-12: qty 2

## 2022-01-12 MED ORDER — CEPHALEXIN 500 MG PO CAPS: 500.0000 mg | ORAL_CAPSULE | Freq: Three times a day (TID) | ORAL | 0 refills | Status: DC

## 2022-01-12 MED ORDER — LIDOCAINE HCL 2 % IJ SOLN
INTRAMUSCULAR | Status: AC
  Administered 2022-01-12: 1.4 mL
  Filled 2022-01-12: qty 20

## 2022-01-12 MED ORDER — TRAMADOL HCL 50 MG PO TABS
25.0000 mg | ORAL_TABLET | Freq: Once | ORAL | Status: AC
  Administered 2022-01-12: 25 mg via ORAL
  Filled 2022-01-12: qty 1

## 2022-01-12 NOTE — ED Triage Notes (Signed)
Pt BIBA from [facility] for unwitnessed fall, pt uncertain if she tripped or 'blacked out'. Known T12 compression fracture. C/o back pain and some neck pain while she was on the floor. No blood thinners or visible trauma. A&Ox2, at baseline. 20ga LAC  177/66 HR 82 95% RA CBG 136

## 2022-01-12 NOTE — ED Provider Notes (Signed)
[facility] DEPT Provider Note   CSN: [id number] Arrival date & time: 01/12/22  3149     History  Chief Complaint  Patient presents with   Fall   Back Pain    Kristin Bryant is a 88 y.o. female.  HPI    88 year old female comes in with chief complaint of fall and back pain.  Patient resides at [facility].  She had an unwitnessed mechanical fall earlier today.  Patient was found on the floor at 5:30 AM.  She was seen in the emergency room recently and was found to have T12 compression fracture.  Patient complains of back pain, neck pain.  She is not on any blood thinners.  She has some discomfort around her eye /face as well.  Patient's son and daughter-in-law at the bedside.   They also indicate that there is concerns for UTI from the nursing home.  Patient states that she has noticed some discomfort with urination.  Home Medications Prior to Admission medications   Medication Sig Start Date End Date Taking? Authorizing Provider  cephALEXin (KEFLEX) 500 MG capsule Take 1 capsule (500 mg total) by mouth 3 (three) times daily. 01/12/22  Yes Varney Biles, MD  acetaminophen (TYLENOL) 650 MG CR tablet Take 650 mg by mouth every 8 (eight) hours as needed for pain.    [provider]  anastrozole (ARIMIDEX) 1 MG tablet Take 1 tablet (1 mg total) by mouth daily. Patient not taking: Reported on 01/08/2022 04/29/20   Nicholas Lose, MD  benazepril (LOTENSIN) 20 MG tablet Take 20 mg by mouth daily.    [provider]  bisacodyl (DULCOLAX) 5 MG EC tablet Take 1 tablet (5 mg total) by mouth daily as needed for moderate constipation (if no bowel movement with miralax and stool softner). 03/14/21 03/14/22  Pokhrel, Corrie Mckusick, MD  Cholecalciferol (VITAMIN D-3) 125 MCG (5000 UT) TABS Take 1 tablet by mouth daily.    [provider]  DiphenhydrAMINE HCl (BENADRYL ALLERGY PO) Take 25 mg by mouth every 8 (eight) hours as needed  (allergies).    [provider]  docusate sodium (COLACE) 100 MG capsule Take 1 capsule (100 mg total) by mouth every 12 (twelve) hours. 07/29/20   Little, Wenda Overland, MD  HYDROcodone-acetaminophen (NORCO) 5-325 MG tablet Take 1-2 tablets by mouth every 6 (six) hours as needed. 01/08/22   Veryl Speak, MD  levothyroxine (SYNTHROID) 88 MCG tablet Take 88 mcg by mouth daily. 12/08/21   [provider]  lidocaine 4 % Place 1 patch onto the skin daily.    [provider]  Multiple Vitamins-Minerals (PRESERVISION AREDS 2 PO) Take 1 capsule by mouth daily.    [provider]  ondansetron (ZOFRAN-ODT) 4 MG disintegrating tablet Take 4 mg by mouth every 8 (eight) hours as needed for nausea or vomiting.    [provider]  polyethylene glycol powder (GLYCOLAX/MIRALAX) 17 GM/SCOOP powder Mix 1 capful in a drink and take by mouth 1-3 times daily until daily soft stools  OTC Patient taking differently: Take 0.5 Containers by mouth in the morning and at bedtime. 07/29/20   Little, Wenda Overland, MD  QUEtiapine (SEROQUEL) 25 MG tablet Take 25 mg by mouth at bedtime. 12/08/21   [provider]  sertraline (ZOLOFT) 25 MG tablet Take 25 mg by mouth daily. 12/08/21   [provider]  timolol (TIMOPTIC) 0.5 % ophthalmic solution Place 1 drop into both eyes 2 (two) times daily.  [provider]  traMADol (ULTRAM) 50 MG tablet Take 25 mg by mouth daily. 12/11/21   [provider]      Allergies    Acetaminophen, Aspirin, Benzonatate, Celexa [citalopram], Chlorzoxazone, Citalopram hydrobromide, Cortisone, Diclofenac-misoprostol, Escitalopram oxalate, Guaifenesin, Guaifenesin & derivatives, Hydrocodone, Ipratropium bromide, [place] valley herb [convallariae majalis], Mirtazapine, Naproxen, Neosporin [bacitracin-polymyxin b], Prednisone, Pseudoephedrine, Ranitidine, Ranitidine hcl, Sudafed [pseudoephedrine hcl], Sulfa antibiotics, Epinephrine,  and Latex    Review of Systems   Review of Systems  All other systems reviewed and are negative.   Physical Exam Updated Vital Signs BP (!) 131/108   Pulse 72   Temp 97.9 F (36.6 C) (Oral)   Resp 16   Ht '5\' 3"'$  (1.6 m)   Wt 56.8 kg   SpO2 97%   BMI 22.18 kg/m  Physical Exam Vitals and nursing note reviewed.  Constitutional:      Appearance: She is well-developed.  HENT:     Head: Atraumatic.     Comments: Mild right-sided periorbital edema/ecchymosis Neck:     Comments: Patient in c-collar.  She is complaining of midline C-spine tenderness in the C3-C4 region. Cardiovascular:     Rate and Rhythm: Normal rate.  Pulmonary:     Effort: Pulmonary effort is normal.  Abdominal:     Palpations: Abdomen is soft.     Tenderness: There is no abdominal tenderness.  Musculoskeletal:        General: No deformity.     Comments: Patient had tenderness over the lower thoracic region with palpation.  She also had some tenderness over the right pelvis area.  Patient is able to move all 4 extremities without any significant pain.  Abdomen is soft and nontender.  Skin:    General: Skin is warm and dry.  Neurological:     Mental Status: She is alert. Mental status is at baseline.     ED Results / Procedures / Treatments   Labs (all labs ordered are listed, but only abnormal results are displayed) Labs Reviewed  URINALYSIS, ROUTINE W REFLEX MICROSCOPIC - Abnormal; Notable for the following components:      Result Value   APPearance HAZY (*)    Protein, ur 30 (*)    Nitrite POSITIVE (*)    Leukocytes,Ua LARGE (*)    WBC, UA >50 (*)    Bacteria, UA FEW (*)    All other components within normal limits    EKG None  Radiology CT Head Wo Contrast  Result Date: 01/12/2022 CLINICAL DATA:  Trauma EXAM: CT HEAD WITHOUT CONTRAST CT CERVICAL SPINE WITHOUT CONTRAST TECHNIQUE: Multidetector CT imaging of the head and cervical spine was performed following the standard protocol without  intravenous contrast. Multiplanar CT image reconstructions of the cervical spine were also generated. RADIATION DOSE REDUCTION: This exam was performed according to the departmental dose-optimization program which includes automated exposure control, adjustment of the mA and/or kV according to patient size and/or use of iterative reconstruction technique. COMPARISON:  None Available. FINDINGS: CT HEAD FINDINGS Brain: No evidence of acute infarction, hemorrhage, hydrocephalus, extra-axial collection or mass lesion/mass effect. There is advanced generalized volume loss Vascular: No hyperdense vessel or unexpected calcification. Skull: Normal. Negative for fracture or focal lesion. Sinuses/Orbits: Bilateral lens replacements. Mild thickening in the bilateral ethmoid, right sphenoid, bilateral maxillary sinuses. Other: None. CT CERVICAL SPINE FINDINGS Alignment: Straightening of the normal cervical lordosis. Skull base and vertebrae: There are reactive degenerative endplate changes at C4, C5, and C6. no evidence of fracture.  Soft tissues and spinal canal: No prevertebral fluid or swelling. No visible canal hematoma. Disc levels: There is a degenerative pannus at the C1-C2 level. There is severe spinal canal stenosis at C4-C5 unlikely C5-C6 secondary to a circumferential disc bulge. Upper chest: Negative. Other: Atherosclerotic vascular calcifications. IMPRESSION: 1.  No CT evidence of intracranial injury. 2.  No evidence of cervical spine fracture. 3. Severe spinal canal stenosis is noted at C4-C5 and C5-C6. If there is clinical concern for myelopathic symptoms, further evaluation with a cervical spine MRI could be considered. Electronically Signed   By: Marin Roberts M.D.   On: 01/12/2022 08:44   CT Cervical Spine Wo Contrast  Result Date: 01/12/2022 CLINICAL DATA:  Trauma EXAM: CT HEAD WITHOUT CONTRAST CT CERVICAL SPINE WITHOUT CONTRAST TECHNIQUE: Multidetector CT imaging of the head and cervical spine was  performed following the standard protocol without intravenous contrast. Multiplanar CT image reconstructions of the cervical spine were also generated. RADIATION DOSE REDUCTION: This exam was performed according to the departmental dose-optimization program which includes automated exposure control, adjustment of the mA and/or kV according to patient size and/or use of iterative reconstruction technique. COMPARISON:  None Available. FINDINGS: CT HEAD FINDINGS Brain: No evidence of acute infarction, hemorrhage, hydrocephalus, extra-axial collection or mass lesion/mass effect. There is advanced generalized volume loss Vascular: No hyperdense vessel or unexpected calcification. Skull: Normal. Negative for fracture or focal lesion. Sinuses/Orbits: Bilateral lens replacements. Mild thickening in the bilateral ethmoid, right sphenoid, bilateral maxillary sinuses. Other: None. CT CERVICAL SPINE FINDINGS Alignment: Straightening of the normal cervical lordosis. Skull base and vertebrae: There are reactive degenerative endplate changes at C4, C5, and C6. no evidence of fracture. Soft tissues and spinal canal: No prevertebral fluid or swelling. No visible canal hematoma. Disc levels: There is a degenerative pannus at the C1-C2 level. There is severe spinal canal stenosis at C4-C5 unlikely C5-C6 secondary to a circumferential disc bulge. Upper chest: Negative. Other: Atherosclerotic vascular calcifications. IMPRESSION: 1.  No CT evidence of intracranial injury. 2.  No evidence of cervical spine fracture. 3. Severe spinal canal stenosis is noted at C4-C5 and C5-C6. If there is clinical concern for myelopathic symptoms, further evaluation with a cervical spine MRI could be considered. Electronically Signed   By: Marin Roberts M.D.   On: 01/12/2022 08:44   DG Hip Unilat W or Wo Pelvis 2-3 Views Right  Result Date: 01/12/2022 CLINICAL DATA:  Fall EXAM: DG HIP (WITH OR WITHOUT PELVIS) 2-3V RIGHT COMPARISON:  CT pelvis  01/07/2022, hip radiographs 03/10/2021 FINDINGS: There is no acute fracture or dislocation. Femoroacetabular alignment of the symphysis pubis and SI joints are intact. There is a remote fracture of the right inferior pubic ramus. The soft tissues are unremarkable. IMPRESSION: No acute fracture or dislocation. Electronically Signed   By: Valetta Mole M.D.   On: 01/12/2022 08:42   DG Thoracic Spine 2 View  Result Date: 01/12/2022 CLINICAL DATA:  Fall, back pain.  Known T12 compression fracture EXAM: THORACIC SPINE 2 VIEWS COMPARISON:  Lumbar CT 01/07/2022 FINDINGS: Mild superior endplate compression fracture at T12, not appreciably changed from recent CT. Remaining thoracic vertebral body heights are maintained. No additional fractures are seen. Alignment is normal. Multilevel mild degenerative disc disease. Aortic atherosclerosis. IMPRESSION: Mild superior endplate compression fracture at T12, not appreciably changed from recent CT. No additional fractures. Electronically Signed   By: Davina Poke D.O.   On: 01/12/2022 08:36    Procedures Procedures    Medications Ordered in ED  Medications  lidocaine (XYLOCAINE) 2 % (with pres) injection (has no administration in time range)  traMADol (ULTRAM) tablet 25 mg (has no administration in time range)  acetaminophen (TYLENOL) tablet 650 mg (has no administration in time range)  cefTRIAXone (ROCEPHIN) injection 500 mg (500 mg Intramuscular Given 01/12/22 0929)    ED Course/ Medical Decision Making/ A&P Clinical Course as of 01/12/22 0934  Fri Jan 12, 2022  0933 CT Cervical Spine Wo Contrast Patient reassessed.  She is moving all 4 extremities.  No weakness appreciated.  Patient denies any sensory complaints.  No clinical concerns for cervical myelopathy.  MRI not indicated at this time.  Results were discussed with the patient and the family.  Made him aware that falls will put her at risk for having complications that could affect her spinal  cord/nerve roots.  C-collar has been removed.   [AN]  [id number] Nitrite(!): POSITIVE IM ceftriaxone ordered. [AN]    Clinical Course User Index [AN] Varney Biles, MD                           Medical Decision Making Amount and/or Complexity of Data Reviewed Labs: ordered. Radiology: ordered.  Risk OTC drugs. Prescription drug management.   This patient presents to the ED with chief complaint(s) of back pain, neck pain after having a mechanical fall with pertinent past medical history of hypertension, hyperlipidemia, previous pelvic fracture, recent visit to the ER because of fall.The complaint involves an extensive differential diagnosis and also carries with it a high risk of complications and morbidity.    Patient's son is at the bedside.  He indicates that patient is having difficulty getting up from the chair and steadying herself.  Once steady, she is able to ambulate.  They spent considerable amount time with her over the weekend, and she did not have any major issues.  The differential diagnosis includes  DDx includes: -Spine fractures - ICH - Fractures - Contusions - Soft tissue injury -UTI    The initial plan is to get urine analysis, CT scan of the head, C-spine and also x-ray of the right hip along with thoracic spine.   Additional history obtained: Additional history obtained from family Records reviewed  reviewed patient's CT lumbar spine from few days ago, she was found to have T12 compression fracture.  Independent labs interpretation:  The following labs were independently interpreted: Urine analysis is nitrite and leukocyte esterase positive with pyuria.  UA confirms suspicion for UTI.  Independent visualization and interpretation of imaging: - I independently visualized the following imaging with scope of interpretation limited to determining acute life threatening conditions related to emergency care: CT scan of the brain, which revealed no evidence of  brain bleed  Treatment and Reassessment: Results of the ER discussed with the patient and family.  She will receive oral tramadol now along with Tylenol and IM ceftriaxone.  Stable for discharge.  Final Clinical Impression(s) / ED Diagnoses Final diagnoses:  Lower urinary tract infectious disease  Cervical arthritis    Rx / DC Orders ED Discharge Orders          Ordered    cephALEXin (KEFLEX) 500 MG capsule  3 times daily        01/12/22 2505              Varney Biles, MD 01/12/22 [phone]

## 2022-01-12 NOTE — ED Triage Notes (Signed)
EMS reports possible UTI

## 2022-01-12 NOTE — Discharge Instructions (Addendum)
Kristin Bryant has severe neck arthritis per our CT scan of the neck and head - there is no brain bleed or fracture. Hip xray is normal. She has uti - antibiotics prescribed for it.  Please give her PT to ensure she can work on her strength and balance. Thanks.

## 2022-01-12 NOTE — ED Notes (Signed)
Pt ambulated with walker.

## 2022-01-15 DIAGNOSIS — R4701 Aphasia: Secondary | ICD-10-CM | POA: Diagnosis not present

## 2022-01-15 DIAGNOSIS — M6281 Muscle weakness (generalized): Secondary | ICD-10-CM | POA: Diagnosis not present

## 2022-01-15 DIAGNOSIS — R1310 Dysphagia, unspecified: Secondary | ICD-10-CM | POA: Diagnosis not present

## 2022-01-16 DIAGNOSIS — M6281 Muscle weakness (generalized): Secondary | ICD-10-CM | POA: Diagnosis not present

## 2022-01-17 ENCOUNTER — Telehealth: Payer: Self-pay

## 2022-01-17 ENCOUNTER — Encounter (HOSPITAL_COMMUNITY): Payer: Self-pay | Admitting: Emergency Medicine

## 2022-01-17 ENCOUNTER — Other Ambulatory Visit: Payer: Self-pay

## 2022-01-17 ENCOUNTER — Emergency Department (HOSPITAL_COMMUNITY): Payer: Medicare Other

## 2022-01-17 ENCOUNTER — Inpatient Hospital Stay (HOSPITAL_COMMUNITY)
Admission: EM | Admit: 2022-01-17 | Discharge: 2022-01-20 | DRG: 439 | Disposition: A | Discharge status: Home Health | Payer: Medicare Other | Source: Skilled Nursing Facility | Attending: Family Medicine | Admitting: Family Medicine

## 2022-01-17 DIAGNOSIS — S22080D Wedge compression fracture of T11-T12 vertebra, subsequent encounter for fracture with routine healing: Secondary | ICD-10-CM | POA: Diagnosis not present

## 2022-01-17 DIAGNOSIS — Z9049 Acquired absence of other specified parts of digestive tract: Secondary | ICD-10-CM | POA: Diagnosis not present

## 2022-01-17 DIAGNOSIS — W19XXXA Unspecified fall, initial encounter: Secondary | ICD-10-CM | POA: Diagnosis not present

## 2022-01-17 DIAGNOSIS — Z79899 Other long term (current) drug therapy: Secondary | ICD-10-CM

## 2022-01-17 DIAGNOSIS — Z888 Allergy status to other drugs, medicaments and biological substances status: Secondary | ICD-10-CM | POA: Diagnosis not present

## 2022-01-17 DIAGNOSIS — Z881 Allergy status to other antibiotic agents status: Secondary | ICD-10-CM

## 2022-01-17 DIAGNOSIS — D1800 Hemangioma unspecified site: Secondary | ICD-10-CM | POA: Diagnosis not present

## 2022-01-17 DIAGNOSIS — E872 Acidosis, unspecified: Secondary | ICD-10-CM | POA: Diagnosis present

## 2022-01-17 DIAGNOSIS — Y92009 Unspecified place in unspecified non-institutional (private) residence as the place of occurrence of the external cause: Secondary | ICD-10-CM | POA: Diagnosis not present

## 2022-01-17 DIAGNOSIS — Z882 Allergy status to sulfonamides status: Secondary | ICD-10-CM | POA: Diagnosis not present

## 2022-01-17 DIAGNOSIS — Z66 Do not resuscitate: Secondary | ICD-10-CM | POA: Diagnosis not present

## 2022-01-17 DIAGNOSIS — M810 Age-related osteoporosis without current pathological fracture: Secondary | ICD-10-CM | POA: Diagnosis not present

## 2022-01-17 DIAGNOSIS — R933 Abnormal findings on diagnostic imaging of other parts of digestive tract: Secondary | ICD-10-CM | POA: Diagnosis not present

## 2022-01-17 DIAGNOSIS — E7800 Pure hypercholesterolemia, unspecified: Secondary | ICD-10-CM | POA: Diagnosis not present

## 2022-01-17 DIAGNOSIS — I951 Orthostatic hypotension: Secondary | ICD-10-CM | POA: Diagnosis not present

## 2022-01-17 DIAGNOSIS — E785 Hyperlipidemia, unspecified: Secondary | ICD-10-CM | POA: Diagnosis present

## 2022-01-17 DIAGNOSIS — D649 Anemia, unspecified: Secondary | ICD-10-CM | POA: Diagnosis present

## 2022-01-17 DIAGNOSIS — Z886 Allergy status to analgesic agent status: Secondary | ICD-10-CM

## 2022-01-17 DIAGNOSIS — Z853 Personal history of malignant neoplasm of breast: Secondary | ICD-10-CM | POA: Diagnosis not present

## 2022-01-17 DIAGNOSIS — Z885 Allergy status to narcotic agent status: Secondary | ICD-10-CM

## 2022-01-17 DIAGNOSIS — F39 Unspecified mood [affective] disorder: Secondary | ICD-10-CM | POA: Diagnosis present

## 2022-01-17 DIAGNOSIS — I10 Essential (primary) hypertension: Secondary | ICD-10-CM | POA: Diagnosis present

## 2022-01-17 DIAGNOSIS — K861 Other chronic pancreatitis: Secondary | ICD-10-CM

## 2022-01-17 DIAGNOSIS — M4854XA Collapsed vertebra, not elsewhere classified, thoracic region, initial encounter for fracture: Secondary | ICD-10-CM | POA: Diagnosis present

## 2022-01-17 DIAGNOSIS — K5900 Constipation, unspecified: Secondary | ICD-10-CM | POA: Diagnosis present

## 2022-01-17 DIAGNOSIS — Z9181 History of falling: Secondary | ICD-10-CM | POA: Diagnosis not present

## 2022-01-17 DIAGNOSIS — S22000A Wedge compression fracture of unspecified thoracic vertebra, initial encounter for closed fracture: Secondary | ICD-10-CM

## 2022-01-17 DIAGNOSIS — R1110 Vomiting, unspecified: Secondary | ICD-10-CM | POA: Diagnosis not present

## 2022-01-17 DIAGNOSIS — Z87891 Personal history of nicotine dependence: Secondary | ICD-10-CM

## 2022-01-17 DIAGNOSIS — R109 Unspecified abdominal pain: Secondary | ICD-10-CM | POA: Diagnosis not present

## 2022-01-17 DIAGNOSIS — K8689 Other specified diseases of pancreas: Secondary | ICD-10-CM | POA: Diagnosis not present

## 2022-01-17 DIAGNOSIS — N281 Cyst of kidney, acquired: Secondary | ICD-10-CM | POA: Diagnosis not present

## 2022-01-17 DIAGNOSIS — Z90710 Acquired absence of both cervix and uterus: Secondary | ICD-10-CM

## 2022-01-17 DIAGNOSIS — Z91040 Latex allergy status: Secondary | ICD-10-CM

## 2022-01-17 DIAGNOSIS — R6889 Other general symptoms and signs: Secondary | ICD-10-CM | POA: Diagnosis not present

## 2022-01-17 DIAGNOSIS — Y92099 Unspecified place in other non-institutional residence as the place of occurrence of the external cause: Secondary | ICD-10-CM

## 2022-01-17 DIAGNOSIS — Z79890 Hormone replacement therapy (postmenopausal): Secondary | ICD-10-CM

## 2022-01-17 DIAGNOSIS — N390 Urinary tract infection, site not specified: Secondary | ICD-10-CM | POA: Diagnosis present

## 2022-01-17 DIAGNOSIS — Z803 Family history of malignant neoplasm of breast: Secondary | ICD-10-CM

## 2022-01-17 DIAGNOSIS — E039 Hypothyroidism, unspecified: Secondary | ICD-10-CM | POA: Diagnosis present

## 2022-01-17 DIAGNOSIS — R1111 Vomiting without nausea: Secondary | ICD-10-CM | POA: Diagnosis not present

## 2022-01-17 DIAGNOSIS — Z79891 Long term (current) use of opiate analgesic: Secondary | ICD-10-CM

## 2022-01-17 DIAGNOSIS — Z743 Need for continuous supervision: Secondary | ICD-10-CM | POA: Diagnosis not present

## 2022-01-17 MED ORDER — SODIUM CHLORIDE 0.9 % IV BOLUS
1000.0000 mL | Freq: Once | INTRAVENOUS | Status: AC
  Administered 2022-01-18: 1000 mL via INTRAVENOUS

## 2022-01-17 MED ORDER — ONDANSETRON HCL 4 MG/2ML IJ SOLN
4.0000 mg | Freq: Once | INTRAMUSCULAR | Status: AC
  Administered 2022-01-18: 4 mg via INTRAVENOUS
  Filled 2022-01-17: qty 2

## 2022-01-17 MED ORDER — FENTANYL CITRATE PF 50 MCG/ML IJ SOSY
50.0000 ug | PREFILLED_SYRINGE | Freq: Once | INTRAMUSCULAR | Status: AC
  Administered 2022-01-18: 50 ug via INTRAVENOUS
  Filled 2022-01-17: qty 1

## 2022-01-17 NOTE — ED Triage Notes (Signed)
   Patient BIB EMS from [facility] with 2 day hx of abdominal pain and emesis.  Patient had last emesis episode before EMS arrival.  Patient states pain is 10/10 in lower abdomen and she feels bloated.

## 2022-01-17 NOTE — Telephone Encounter (Signed)
     Patient  visit on 10/6  at [facility]  Have you been able to follow up with your primary care physician? yes  The patient was or was not able to obtain any needed medicine or equipment. yes  Are there diet recommendations that you are having difficulty following? yes  Patient expresses understanding of discharge instructions and education provided has no other needs at this time. yes     [facility]  [phone] [address], [place], [place] [address] Phone: [phone] Email: [email or web address]

## 2022-01-17 NOTE — ED Provider Notes (Signed)
[facility] DEPT Provider Note   CSN: [id number] Arrival date & time: 01/17/22  2325     History  Chief Complaint  Patient presents with   Abdominal Pain   Emesis    Kristin Bryant is a 88 y.o. female.  HPI     This is a 88 year old lady brought in by EMS with abdominal pain and emesis.  Patient is a generally poor historian.  She states that she believes she has vomited once and has had some belly discomfort.  She cannot provide specific information.  She points to her mid abdomen when asked when it hurts.  She does state that she feels bloated.  Cannot tell me whether she has had normal bowel movements or diarrhea.  No known sick contacts.  She is technically oriented x3.  Per EMS she had 1 episode of emesis with EMS.  Home Medications Prior to Admission medications   Medication Sig Start Date End Date Taking? Authorizing Provider  acetaminophen (TYLENOL) 650 MG CR tablet Take 650 mg by mouth every 8 (eight) hours as needed for pain.    [provider]  anastrozole (ARIMIDEX) 1 MG tablet Take 1 tablet (1 mg total) by mouth daily. Patient not taking: Reported on 01/08/2022 04/29/20   Nicholas Lose, MD  benazepril (LOTENSIN) 20 MG tablet Take 20 mg by mouth daily.    [provider]  bisacodyl (DULCOLAX) 5 MG EC tablet Take 1 tablet (5 mg total) by mouth daily as needed for moderate constipation (if no bowel movement with miralax and stool softner). 03/14/21 03/14/22  Pokhrel, Corrie Mckusick, MD  cephALEXin (KEFLEX) 500 MG capsule Take 1 capsule (500 mg total) by mouth 3 (three) times daily. 01/12/22   Varney Biles, MD  Cholecalciferol (VITAMIN D-3) 125 MCG (5000 UT) TABS Take 1 tablet by mouth daily.    [provider]  DiphenhydrAMINE HCl (BENADRYL ALLERGY PO) Take 25 mg by mouth every 8 (eight) hours as needed (allergies).    [provider]  docusate sodium (COLACE) 100 MG capsule Take 1 capsule (100 mg total) by mouth every  12 (twelve) hours. 07/29/20   Little, Wenda Overland, MD  HYDROcodone-acetaminophen (NORCO) 5-325 MG tablet Take 1-2 tablets by mouth every 6 (six) hours as needed. 01/08/22   Veryl Speak, MD  levothyroxine (SYNTHROID) 88 MCG tablet Take 88 mcg by mouth daily. 12/08/21   [provider]  lidocaine 4 % Place 1 patch onto the skin daily.    [provider]  Multiple Vitamins-Minerals (PRESERVISION AREDS 2 PO) Take 1 capsule by mouth daily.    [provider]  ondansetron (ZOFRAN-ODT) 4 MG disintegrating tablet Take 4 mg by mouth every 8 (eight) hours as needed for nausea or vomiting.    [provider]  polyethylene glycol powder (GLYCOLAX/MIRALAX) 17 GM/SCOOP powder Mix 1 capful in a drink and take by mouth 1-3 times daily until daily soft stools  OTC Patient taking differently: Take 0.5 Containers by mouth in the morning and at bedtime. 07/29/20   Little, Wenda Overland, MD  QUEtiapine (SEROQUEL) 25 MG tablet Take 25 mg by mouth at bedtime. 12/08/21   [provider]  sertraline (ZOLOFT) 25 MG tablet Take 25 mg by mouth daily. 12/08/21   [provider]  timolol (TIMOPTIC) 0.5 % ophthalmic solution Place 1 drop into both eyes 2 (two) times daily.    [provider]  traMADol (ULTRAM) 50 MG tablet Take 25 mg by mouth daily. 12/11/21  [provider]      Allergies    Acetaminophen, Aspirin, Benzonatate, Celexa [citalopram], Chlorzoxazone, Citalopram hydrobromide, Cortisone, Diclofenac-misoprostol, Escitalopram oxalate, Guaifenesin, Guaifenesin & derivatives, Hydrocodone, Ipratropium bromide, [place] herb [convallariae majalis], Mirtazapine, Naproxen, Neosporin [bacitracin-polymyxin b], Prednisone, Pseudoephedrine, Ranitidine, Ranitidine hcl, Sudafed [pseudoephedrine hcl], Sulfa antibiotics, Epinephrine, and Latex    Review of Systems   Review of Systems  Gastrointestinal:  Positive for abdominal pain, nausea and vomiting.   All other systems reviewed and are negative.   Physical Exam Updated Vital Signs BP (!) 100/42   Pulse 72   Temp 97.6 F (36.4 C) (Oral)   Resp (!) 22   Ht 1.6 m ('5\' 3"'$ )   Wt 56.7 kg   SpO2 95%   BMI 22.14 kg/m  Physical Exam Vitals and nursing note reviewed.  Constitutional:      Appearance: She is well-developed.     Comments: Elderly, nontoxic-appearing  HENT:     Head: Normocephalic and atraumatic.  Eyes:     Pupils: Pupils are equal, round, and reactive to light.  Cardiovascular:     Rate and Rhythm: Normal rate and regular rhythm.     Heart sounds: Normal heart sounds.  Pulmonary:     Effort: Pulmonary effort is normal. No respiratory distress.     Breath sounds: No wheezing.  Abdominal:     General: Bowel sounds are normal.     Palpations: Abdomen is soft.     Tenderness: There is generalized abdominal tenderness. There is no guarding or rebound.  Musculoskeletal:     Cervical back: Neck supple.  Skin:    General: Skin is warm and dry.  Neurological:     Mental Status: She is alert and oriented to person, place, and time.  Psychiatric:        Mood and Affect: Mood normal.     ED Results / Procedures / Treatments   Labs (all labs ordered are listed, but only abnormal results are displayed) Labs Reviewed  CBC WITH DIFFERENTIAL/PLATELET - Abnormal; Notable for the following components:      Result Value   RBC 3.62 (*)    Hemoglobin 11.8 (*)    HCT 34.9 (*)    All other components within normal limits  COMPREHENSIVE METABOLIC PANEL - Abnormal; Notable for the following components:   Sodium 134 (*)    Glucose, Bld 172 (*)    Calcium 8.7 (*)    Total Protein 6.4 (*)    Albumin 3.2 (*)    All other components within normal limits  LIPASE, BLOOD - Abnormal; Notable for the following components:   Lipase 166 (*)    All other components within normal limits  LACTIC ACID, PLASMA - Abnormal; Notable for the following components:   Lactic Acid, Venous 2.0  (*)    All other components within normal limits  LACTIC ACID, PLASMA  URINALYSIS, ROUTINE W REFLEX MICROSCOPIC    EKG None  Radiology CT ABDOMEN PELVIS W CONTRAST  Result Date: 01/18/2022 CLINICAL DATA:  Nausea and vomiting. Lower abdominal pain. Bloating. EXAM: CT ABDOMEN AND PELVIS WITH CONTRAST TECHNIQUE: Multidetector CT imaging of the abdomen and pelvis was performed using the standard protocol following bolus administration of intravenous contrast. RADIATION DOSE REDUCTION: This exam was performed according to the departmental dose-optimization program which includes automated exposure control, adjustment of the mA and/or kV according to patient size and/or use of iterative reconstruction technique. CONTRAST:  63m OMNIPAQUE IOHEXOL 300 MG/ML  SOLN COMPARISON:  CT pelvis 01/07/2022 FINDINGS: Lower chest: Mild atelectasis in the lung bases, greater on the right. Hepatobiliary: No focal liver abnormality is seen. No gallstones, gallbladder wall thickening, or biliary dilatation. Pancreas: Scattered calcifications in the pancreatic head likely to represent evidence of chronic pancreatitis. There is a stone in the body of the pancreas likely in the duct with mild proximal pancreatic ductal dilatation. The stone measures 6 mm in diameter. The stone was present on prior CT lumbar spine from 01/07/2022. No stranding around the pancreas. Spleen: Normal in size without focal abnormality. Adrenals/Urinary Tract: No adrenal gland nodules. Cyst on the right kidney measuring 4.6 cm diameter. No follow-up imaging is indicated. No hydronephrosis or hydroureter. Bladder is normal. Stomach/Bowel: Stomach, small bowel, and colon are not abnormally distended. Stool fills the colon. No wall thickening or inflammatory changes. Appendix is not identified. Vascular/Lymphatic: Calcification of the aorta. No aneurysm. No significant lymphadenopathy. Reproductive: Status post hysterectomy. No adnexal masses. Other: No  free air or free fluid in the abdomen. Abdominal wall musculature appears intact. Musculoskeletal: Degenerative changes. No acute bony abnormalities. Compression of superior endplate of P53. No change since prior CT lumbar spine from 01/07/2022. IMPRESSION: 1. No evidence of bowel obstruction or inflammation. 2. 6 mm stone in the body of the pancreas likely in the pancreatic duct. Mild proximal pancreatic ductal dilatation. Although ductal dilatation is likely related to the stone, follow-up with elective MRI/MRCP is recommended to exclude an underlying obstructing mass lesion. 3. Calcifications in the head of the pancreas likely indicating evidence of prior chronic pancreatitis. No acute inflammatory changes. 4. Atelectasis in the lung bases. 5. Aortic atherosclerosis. Electronically Signed   By: Lucienne Capers M.D.   On: 01/18/2022 03:28   DG Abdomen Acute W/Chest  Result Date: 01/18/2022 CLINICAL DATA:  Abdominal pain, vomiting EXAM: DG ABDOMEN ACUTE WITH 1 VIEW CHEST COMPARISON:  None Available. FINDINGS: There is no evidence of dilated bowel loops or free intraperitoneal air. No radiopaque calculi or other significant radiographic abnormality is seen. Heart size and mediastinal contours are within normal limits. Both lungs are clear. IMPRESSION: Negative abdominal radiographs.  No acute cardiopulmonary disease. Electronically Signed   By: Fidela Salisbury M.D.   On: 01/18/2022 00:08    Procedures Procedures    Medications Ordered in ED Medications  sodium chloride (PF) 0.9 % injection (has no administration in time range)  sodium chloride 0.9 % bolus 1,000 mL (0 mLs Intravenous Stopped 01/18/22 0115)  fentaNYL (SUBLIMAZE) injection 50 mcg (50 mcg Intravenous Given 01/18/22 0006)  ondansetron (ZOFRAN) injection 4 mg (4 mg Intravenous Given 01/18/22 0005)  iohexol (OMNIPAQUE) 300 MG/ML solution 80 mL (80 mLs Intravenous Contrast Given 01/18/22 0304)    ED Course/ Medical Decision Making/ A&P                            Medical Decision Making Amount and/or Complexity of Data Reviewed Labs: ordered. Radiology: ordered.  Risk Prescription drug management. Decision regarding hospitalization.   This patient presents to the ED for concern of domino pain, emesis, this involves an extensive number of treatment options, and is a complaint that carries with it a high risk of complications and morbidity.  I considered the following differential and admission for this acute, potentially life threatening condition.  The differential diagnosis includes gastritis, gastroenteritis, obstructive pathology, pancreatitis, cholecystitis  MDM:    This is a 88 year old female with reported recent history of abdominal pain and vomiting.  She is a poor historian to me.  She does have abdominal tenderness on exam.  She is afebrile and vital signs are reassuring.  Labs obtained.  Lipase 122.  No significant leukocytosis or metabolic derangement.  Normal LFTs.  CT scan of the abdomen obtained.  Given ongoing pain and vomiting.  She has a 6 mm stone in the body of the pancreas likely in the pancreatic duct.  She has some ductal dilation.  Recommend MR CP.  Additionally she has calcifications suggestive of chronic pancreatitis.  Given slightly elevated lipase in the setting of acute pain and vomiting, suspect she may have a acute element of pancreatitis as well.  We will plan for admission for symptom control and MRCP.  She is DNR.  (Labs, imaging, consults)  Labs: I Ordered, and personally interpreted labs.  The pertinent results include: CBC, BMP, lipase, urinalysis  Imaging Studies ordered: I ordered imaging studies including CT abdomen and pelvis I independently visualized and interpreted imaging. I agree with the radiologist interpretation  Additional history obtained from chart review.  External records from outside source obtained and reviewed including prior evaluations  Cardiac Monitoring: The  patient was maintained on a cardiac monitor.  I personally viewed and interpreted the cardiac monitored which showed an underlying rhythm of: Sinus rhythm  Reevaluation: After the interventions noted above, I reevaluated the patient and found that they have :improved  Social Determinants of Health: Lives in a living facility  Disposition: Admit  Co morbidities that complicate the patient evaluation  Past Medical History:  Diagnosis Date   Breast cancer ([place]) 12/2018   right   Elevated cholesterol    Hypertension    Hypothyroidism    Osteoporosis      Medicines Meds ordered this encounter  Medications   sodium chloride 0.9 % bolus 1,000 mL   fentaNYL (SUBLIMAZE) injection 50 mcg   ondansetron (ZOFRAN) injection 4 mg   sodium chloride (PF) 0.9 % injection    Carpenter, Lori M: cabinet override   iohexol (OMNIPAQUE) 300 MG/ML solution 80 mL   0.9 %  sodium chloride infusion    I have reviewed the patients home medicines and have made adjustments as needed  Problem List / ED Course: Problem List Items Addressed This Visit   None Visit Diagnoses     Other chronic pancreatitis ([place])    -  Primary   Relevant Medications   fentaNYL (SUBLIMAZE) injection 50 mcg (Completed)   Pancreatic duct stones                       Final Clinical Impression(s) / ED Diagnoses Final diagnoses:  Other chronic pancreatitis ([place])  Pancreatic duct stones    Rx / DC Orders ED Discharge Orders     None         Dina Rich, Barbette Hair, MD 01/18/22 0408

## 2022-01-18 ENCOUNTER — Observation Stay (HOSPITAL_COMMUNITY): Payer: Medicare Other

## 2022-01-18 ENCOUNTER — Encounter (HOSPITAL_COMMUNITY): Payer: Self-pay | Admitting: Internal Medicine

## 2022-01-18 ENCOUNTER — Emergency Department (HOSPITAL_COMMUNITY): Payer: Medicare Other

## 2022-01-18 DIAGNOSIS — S22000A Wedge compression fracture of unspecified thoracic vertebra, initial encounter for closed fracture: Secondary | ICD-10-CM

## 2022-01-18 DIAGNOSIS — R1110 Vomiting, unspecified: Secondary | ICD-10-CM

## 2022-01-18 DIAGNOSIS — R109 Unspecified abdominal pain: Secondary | ICD-10-CM | POA: Diagnosis present

## 2022-01-18 DIAGNOSIS — D1800 Hemangioma unspecified site: Secondary | ICD-10-CM | POA: Diagnosis not present

## 2022-01-18 DIAGNOSIS — W19XXXA Unspecified fall, initial encounter: Secondary | ICD-10-CM | POA: Diagnosis not present

## 2022-01-18 DIAGNOSIS — R933 Abnormal findings on diagnostic imaging of other parts of digestive tract: Secondary | ICD-10-CM | POA: Diagnosis not present

## 2022-01-18 DIAGNOSIS — K8689 Other specified diseases of pancreas: Secondary | ICD-10-CM | POA: Diagnosis not present

## 2022-01-18 DIAGNOSIS — Y92009 Unspecified place in unspecified non-institutional (private) residence as the place of occurrence of the external cause: Secondary | ICD-10-CM

## 2022-01-18 DIAGNOSIS — S22080D Wedge compression fracture of T11-T12 vertebra, subsequent encounter for fracture with routine healing: Secondary | ICD-10-CM

## 2022-01-18 DIAGNOSIS — N281 Cyst of kidney, acquired: Secondary | ICD-10-CM | POA: Diagnosis not present

## 2022-01-18 DIAGNOSIS — K861 Other chronic pancreatitis: Secondary | ICD-10-CM

## 2022-01-18 HISTORY — DX: Other chronic pancreatitis: K86.1

## 2022-01-18 HISTORY — DX: Wedge compression fracture of unspecified thoracic vertebra, initial encounter for closed fracture: S22.000A

## 2022-01-18 LAB — CBC WITH DIFFERENTIAL/PLATELET
Abs Immature Granulocytes: 0.03 10*3/uL (ref 0.00–0.07)
Basophils Absolute: 0 10*3/uL (ref 0.0–0.1)
Basophils Relative: 0 %
Eosinophils Absolute: 0.2 10*3/uL (ref 0.0–0.5)
Eosinophils Relative: 2 %
HCT: 34.9 % — ABNORMAL LOW (ref 36.0–46.0)
Hemoglobin: 11.8 g/dL — ABNORMAL LOW (ref 12.0–15.0)
Immature Granulocytes: 0 %
Lymphocytes Relative: 18 %
Lymphs Abs: 1.8 10*3/uL (ref 0.7–4.0)
MCH: 32.6 pg (ref 26.0–34.0)
MCHC: 33.8 g/dL (ref 30.0–36.0)
MCV: 96.4 fL (ref 80.0–100.0)
Monocytes Absolute: 0.5 10*3/uL (ref 0.1–1.0)
Monocytes Relative: 5 %
Neutro Abs: 7.6 10*3/uL (ref 1.7–7.7)
Neutrophils Relative %: 75 %
Platelets: 272 10*3/uL (ref 150–400)
RBC: 3.62 MIL/uL — ABNORMAL LOW (ref 3.87–5.11)
RDW: 12.1 % (ref 11.5–15.5)
WBC: 10.1 10*3/uL (ref 4.0–10.5)
nRBC: 0 % (ref 0.0–0.2)

## 2022-01-18 LAB — URINALYSIS, ROUTINE W REFLEX MICROSCOPIC
Bilirubin Urine: NEGATIVE
Glucose, UA: NEGATIVE mg/dL
Hgb urine dipstick: NEGATIVE
Ketones, ur: NEGATIVE mg/dL
Leukocytes,Ua: NEGATIVE
Nitrite: NEGATIVE
Protein, ur: NEGATIVE mg/dL
Specific Gravity, Urine: 1.023 (ref 1.005–1.030)
pH: 5 (ref 5.0–8.0)

## 2022-01-18 LAB — CBC
HCT: 35.6 % — ABNORMAL LOW (ref 36.0–46.0)
Hemoglobin: 11.7 g/dL — ABNORMAL LOW (ref 12.0–15.0)
MCH: 31.6 pg (ref 26.0–34.0)
MCHC: 32.9 g/dL (ref 30.0–36.0)
MCV: 96.2 fL (ref 80.0–100.0)
Platelets: 259 10*3/uL (ref 150–400)
RBC: 3.7 MIL/uL — ABNORMAL LOW (ref 3.87–5.11)
RDW: 12.4 % (ref 11.5–15.5)
WBC: 7.2 10*3/uL (ref 4.0–10.5)
nRBC: 0 % (ref 0.0–0.2)

## 2022-01-18 LAB — COMPREHENSIVE METABOLIC PANEL
ALT: 20 U/L (ref 0–44)
ALT: 22 U/L (ref 0–44)
AST: 29 U/L (ref 15–41)
AST: 31 U/L (ref 15–41)
Albumin: 3.2 g/dL — ABNORMAL LOW (ref 3.5–5.0)
Albumin: 3.3 g/dL — ABNORMAL LOW (ref 3.5–5.0)
Alkaline Phosphatase: 93 U/L (ref 38–126)
Alkaline Phosphatase: 95 U/L (ref 38–126)
Anion gap: 6 (ref 5–15)
Anion gap: 7 (ref 5–15)
BUN: 13 mg/dL (ref 8–23)
BUN: 16 mg/dL (ref 8–23)
CO2: 23 mmol/L (ref 22–32)
CO2: 25 mmol/L (ref 22–32)
Calcium: 8.6 mg/dL — ABNORMAL LOW (ref 8.9–10.3)
Calcium: 8.7 mg/dL — ABNORMAL LOW (ref 8.9–10.3)
Chloride: 104 mmol/L (ref 98–111)
Chloride: 106 mmol/L (ref 98–111)
Creatinine, Ser: 0.64 mg/dL (ref 0.44–1.00)
Creatinine, Ser: 0.75 mg/dL (ref 0.44–1.00)
GFR, Estimated: >60 mL/min (ref >60)
GFR, Estimated: >60 mL/min (ref >60)
Glucose, Bld: 121 mg/dL — ABNORMAL HIGH (ref 70–99)
Glucose, Bld: 172 mg/dL — ABNORMAL HIGH (ref 70–99)
Potassium: 4 mmol/L (ref 3.5–5.1)
Potassium: 4.4 mmol/L (ref 3.5–5.1)
Sodium: 134 mmol/L — ABNORMAL LOW (ref 135–145)
Sodium: 137 mmol/L (ref 135–145)
Total Bilirubin: 0.5 mg/dL (ref 0.3–1.2)
Total Bilirubin: 0.5 mg/dL (ref 0.3–1.2)
Total Protein: 6.4 g/dL — ABNORMAL LOW (ref 6.5–8.1)
Total Protein: 6.5 g/dL (ref 6.5–8.1)

## 2022-01-18 LAB — LACTIC ACID, PLASMA
Lactic Acid, Venous: 1.4 mmol/L (ref 0.5–1.9)
Lactic Acid, Venous: 1.7 mmol/L (ref 0.5–1.9)
Lactic Acid, Venous: 2 mmol/L (ref 0.5–1.9)

## 2022-01-18 LAB — LIPASE, BLOOD
Lipase: 101 U/L — ABNORMAL HIGH (ref 11–51)
Lipase: 166 U/L — ABNORMAL HIGH (ref 11–51)

## 2022-01-18 MED ORDER — SODIUM CHLORIDE 0.9 % IV SOLN: INTRAVENOUS | Status: DC

## 2022-01-18 MED ORDER — IOHEXOL 300 MG/ML  SOLN
80.0000 mL | Freq: Once | INTRAMUSCULAR | Status: AC | PRN
  Administered 2022-01-18: 80 mL via INTRAVENOUS

## 2022-01-18 MED ORDER — ACETAMINOPHEN 325 MG PO TABS
650.0000 mg | ORAL_TABLET | Freq: Four times a day (QID) | ORAL | Status: DC | PRN
  Administered 2022-01-18: 650 mg via ORAL
  Filled 2022-01-18: qty 2

## 2022-01-18 MED ORDER — SERTRALINE HCL 25 MG PO TABS
25.0000 mg | ORAL_TABLET | Freq: Every day | ORAL | Status: DC
  Administered 2022-01-18 – 2022-01-20 (×3): 25 mg via ORAL
  Filled 2022-01-18 (×3): qty 1

## 2022-01-18 MED ORDER — NALOXONE HCL 0.4 MG/ML IJ SOLN: 0.4000 mg | INTRAMUSCULAR | Status: DC | PRN

## 2022-01-18 MED ORDER — LEVOTHYROXINE SODIUM 88 MCG PO TABS
88.0000 ug | ORAL_TABLET | Freq: Every day | ORAL | Status: DC
  Administered 2022-01-19 – 2022-01-20 (×2): 88 ug via ORAL
  Filled 2022-01-18 (×3): qty 1

## 2022-01-18 MED ORDER — CEPHALEXIN 500 MG PO CAPS
500.0000 mg | ORAL_CAPSULE | Freq: Three times a day (TID) | ORAL | Status: DC
  Administered 2022-01-18 – 2022-01-20 (×7): 500 mg via ORAL
  Filled 2022-01-18 (×7): qty 1

## 2022-01-18 MED ORDER — LIDOCAINE 5 % EX PTCH
2.0000 | MEDICATED_PATCH | Freq: Every morning | CUTANEOUS | Status: DC
  Administered 2022-01-19 – 2022-01-20 (×2): 2 via TRANSDERMAL
  Filled 2022-01-18 (×2): qty 2

## 2022-01-18 MED ORDER — LATANOPROST 0.005 % OP SOLN
1.0000 [drp] | Freq: Every day | OPHTHALMIC | Status: DC
  Administered 2022-01-18 – 2022-01-19 (×2): 1 [drp] via OPHTHALMIC
  Filled 2022-01-18: qty 2.5

## 2022-01-18 MED ORDER — TIMOLOL MALEATE 0.5 % OP SOLN
1.0000 [drp] | Freq: Two times a day (BID) | OPHTHALMIC | Status: DC
  Administered 2022-01-18 – 2022-01-20 (×4): 1 [drp] via OPHTHALMIC
  Filled 2022-01-18: qty 5

## 2022-01-18 MED ORDER — ONDANSETRON HCL 4 MG/2ML IJ SOLN: 4.0000 mg | Freq: Four times a day (QID) | INTRAMUSCULAR | Status: DC | PRN

## 2022-01-18 MED ORDER — TRAMADOL HCL 50 MG PO TABS
25.0000 mg | ORAL_TABLET | Freq: Three times a day (TID) | ORAL | Status: DC | PRN
  Administered 2022-01-18: 25 mg via ORAL
  Filled 2022-01-18: qty 1

## 2022-01-18 MED ORDER — POLYETHYLENE GLYCOL 3350 17 G PO PACK
17.0000 g | PACK | Freq: Every day | ORAL | Status: DC
  Administered 2022-01-18 – 2022-01-20 (×3): 17 g via ORAL
  Filled 2022-01-18 (×3): qty 1

## 2022-01-18 MED ORDER — LORAZEPAM 2 MG/ML IJ SOLN
0.5000 mg | Freq: Once | INTRAMUSCULAR | Status: AC
  Administered 2022-01-18: 0.5 mg via INTRAVENOUS
  Filled 2022-01-18: qty 1

## 2022-01-18 MED ORDER — SODIUM CHLORIDE (PF) 0.9 % IJ SOLN
INTRAMUSCULAR | Status: AC
  Filled 2022-01-18: qty 50

## 2022-01-18 MED ORDER — TRAMADOL HCL 50 MG PO TABS
25.0000 mg | ORAL_TABLET | Freq: Every day | ORAL | Status: DC
  Administered 2022-01-19 – 2022-01-20 (×2): 25 mg via ORAL
  Filled 2022-01-18 (×2): qty 1

## 2022-01-18 MED ORDER — GADOBUTROL 1 MMOL/ML IV SOLN
6.0000 mL | Freq: Once | INTRAVENOUS | Status: AC | PRN
  Administered 2022-01-18: 6 mL via INTRAVENOUS

## 2022-01-18 MED ORDER — FENTANYL CITRATE PF 50 MCG/ML IJ SOSY
12.5000 ug | PREFILLED_SYRINGE | INTRAMUSCULAR | Status: DC | PRN
  Administered 2022-01-18 – 2022-01-19 (×3): 12.5 ug via INTRAVENOUS
  Filled 2022-01-18 (×3): qty 1

## 2022-01-18 NOTE — ED Notes (Signed)
Pt aware of need for urine specimen. 

## 2022-01-18 NOTE — Hospital Course (Addendum)
88 year old woman presenting with abdominal pain and vomiting.  CT showed 6 mm stone in the body of the pancreas, a likely in the pancreatic duct.  Admitted for further evaluation.  Seen by gastroenterology with recommendation for conservative management, no oral for pancreatic enzyme supplementation at this time.  Main treatment is pain control.  If stone extraction were desired, she would have to be referred to a tertiary care center.  Not recommended by GI and family does not desire at this point.  Still has some pain, unclear whether just back or also abdominal.  Start pancreatic enzymes.  PT consult appreciated.

## 2022-01-18 NOTE — H&P (Signed)
History and Physical    Kristin Bryant DOB: 09-21-1922 DOA: 01/17/2022  PCP: Earmon Phoenix, NP  Patient coming from:  Nursing home  Chief Complaint: Abdominal pain  HPI: Kristin Bryant is a 88 y.o. female with medical history significant of breast cancer in 2020, hyperlipidemia, hypertension, hypothyroidism, osteoporosis presented to the ED with complaints of abdominal pain and vomiting.  Vital signs on arrival: Temperature 97.8 F, pulse 87, respiratory rate 18, blood pressure 122/59, SPO2 99% on room air.  Labs showing no leukocytosis, hemoglobin 11.8 (stable), normal LFTs, lipase 166, lactic acid 1.4> 2.0, UA without signs of infection.  X-rays of chest and abdomen negative for acute finding.  CT abdomen pelvis with contrast showing a 6 mm stone in the body of the pancreas likely in the pancreatic duct with mild proximal pancreatic ductal dilatation.  Radiologist feels that the ductal dilatation is likely related to the stone, however, recommending MRCP to exclude an underlying obstructing mass lesion.  In addition, CT showing calcifications in the head of the pancreas likely indicating evidence of prior chronic pancreatitis; no acute inflammatory changes. Patient was given fentanyl, Zofran, and 1 L normal saline bolus.  Patient able to give limited history.  She reports severe left-sided abdominal pain, unclear when the pain started.  Also reporting vomiting and poor p.o. intake.  Reports constipation for which she is taking prune juice at home.  Denies fevers, cough, shortness of breath, or chest pain.  Review of Systems:  Review of Systems  All other systems reviewed and are negative.   Past Medical History:  Diagnosis Date   Breast cancer ([place]) 12/2018   right   Elevated cholesterol    Hypertension    Hypothyroidism    Osteoporosis     Past Surgical History:  Procedure Laterality Date   APPENDECTOMY     BACK SURGERY     CATARACT EXTRACTION     VAGINAL HYSTERECTOMY        reports that she has quit smoking. Her smoking use included cigarettes. She has never used smokeless tobacco. No history on file for alcohol use and drug use.  Allergies  Allergen Reactions   Acetaminophen    Aspirin    Benzonatate Nausea Only    Other reaction(s): nausea   Celexa [Citalopram]    Chlorzoxazone    Citalopram Hydrobromide    Cortisone     Other reaction(s): infection   Diclofenac-Misoprostol     Other reaction(s): Unknown   Escitalopram Oxalate     Other reaction(s): Unknown   Guaifenesin     Other reaction(s): Unknown   Guaifenesin & Derivatives Nausea Only   Hydrocodone     Other reaction(s): Unknown   Ipratropium Bromide     Other reaction(s): nausea   [employer] [place] [employer] [Convallariae Majalis]    Mirtazapine     Passing out Other reaction(s): passed out?   Naproxen     Other reaction(s): Unknown   Neosporin [Bacitracin-Polymyxin B]     Other reaction(s): Unknown   Prednisone     Other reaction(s): Unknown   Pseudoephedrine     Other reaction(s): Unknown   Ranitidine Nausea Only and Other (See Comments)    Stomach upset   Ranitidine Hcl     Other reaction(s): stomach upset, nausea   Sudafed [Pseudoephedrine Hcl]    Sulfa Antibiotics     Other reaction(s): Unknown   Epinephrine Palpitations    Rapid heart rate   Latex Rash    Family  History  Problem Relation Age of Onset   Stomach cancer Father    Breast cancer Sister    Prostate cancer Brother    Diabetes Son     Prior to Admission medications   Medication Sig Start Date End Date Taking? Authorizing Provider  acetaminophen (TYLENOL) 650 MG CR tablet Take 650 mg by mouth every 8 (eight) hours as needed for pain.    [provider]  anastrozole (ARIMIDEX) 1 MG tablet Take 1 tablet (1 mg total) by mouth daily. Patient not taking: Reported on 01/08/2022 04/29/20   Nicholas Lose, MD  benazepril (LOTENSIN) 20 MG tablet Take 20 mg by mouth daily.    [provider]   bisacodyl (DULCOLAX) 5 MG EC tablet Take 1 tablet (5 mg total) by mouth daily as needed for moderate constipation (if no bowel movement with miralax and stool softner). 03/14/21 03/14/22  Pokhrel, Corrie Mckusick, MD  cephALEXin (KEFLEX) 500 MG capsule Take 1 capsule (500 mg total) by mouth 3 (three) times daily. 01/12/22   Varney Biles, MD  Cholecalciferol (VITAMIN D-3) 125 MCG (5000 UT) TABS Take 1 tablet by mouth daily.    [provider]  DiphenhydrAMINE HCl (BENADRYL ALLERGY PO) Take 25 mg by mouth every 8 (eight) hours as needed (allergies).    [provider]  docusate sodium (COLACE) 100 MG capsule Take 1 capsule (100 mg total) by mouth every 12 (twelve) hours. 07/29/20   Little, Wenda Overland, MD  HYDROcodone-acetaminophen (NORCO) 5-325 MG tablet Take 1-2 tablets by mouth every 6 (six) hours as needed. 01/08/22   Veryl Speak, MD  levothyroxine (SYNTHROID) 88 MCG tablet Take 88 mcg by mouth daily. 12/08/21   [provider]  lidocaine 4 % Place 1 patch onto the skin daily.    [provider]  Multiple Vitamins-Minerals (PRESERVISION AREDS 2 PO) Take 1 capsule by mouth daily.    [provider]  ondansetron (ZOFRAN-ODT) 4 MG disintegrating tablet Take 4 mg by mouth every 8 (eight) hours as needed for nausea or vomiting.    [provider]  polyethylene glycol powder (GLYCOLAX/MIRALAX) 17 GM/SCOOP powder Mix 1 capful in a drink and take by mouth 1-3 times daily until daily soft stools  OTC Patient taking differently: Take 0.5 Containers by mouth in the morning and at bedtime. 07/29/20   Little, Wenda Overland, MD  QUEtiapine (SEROQUEL) 25 MG tablet Take 25 mg by mouth at bedtime. 12/08/21   [provider]  sertraline (ZOLOFT) 25 MG tablet Take 25 mg by mouth daily. 12/08/21   [provider]  timolol (TIMOPTIC) 0.5 % ophthalmic solution Place 1 drop into both eyes 2 (two) times daily.    [provider]  traMADol (ULTRAM) 50 MG  tablet Take 25 mg by mouth daily. 12/11/21   [provider]    Physical Exam: Vitals:   01/18/22 0300 01/18/22 0330 01/18/22 0339 01/18/22 0400  BP: (!) 124/56 108/70  (!) 100/42  Pulse: 74 84  72  Resp: 17 18  (!) 22  Temp:   97.6 F (36.4 C)   TempSrc:   Oral   SpO2: 96% 96%  95%  Weight:      Height:        Physical Exam Vitals reviewed.  Constitutional:      General: She is not in acute distress. HENT:     Head: Normocephalic and atraumatic.     Mouth/Throat:     Mouth: Mucous membranes are dry.  Eyes:  Extraocular Movements: Extraocular movements intact.  Cardiovascular:     Rate and Rhythm: Normal rate and regular rhythm.     Pulses: Normal pulses.  Pulmonary:     Effort: Pulmonary effort is normal. No respiratory distress.     Breath sounds: Normal breath sounds.  Abdominal:     General: Bowel sounds are normal. There is no distension.     Palpations: Abdomen is soft.     Tenderness: There is abdominal tenderness. There is no guarding or rebound.     Comments: Generalized tenderness to palpation  Musculoskeletal:        General: No swelling or tenderness.     Cervical back: Normal range of motion.  Skin:    General: Skin is warm and dry.  Neurological:     General: No focal deficit present.     Mental Status: She is alert and oriented to person, place, and time.     Labs on Admission: I have personally reviewed following labs and imaging studies  CBC: Recent Labs  Lab 01/17/22 0013  WBC 10.1  NEUTROABS 7.6  HGB 11.8*  HCT 34.9*  MCV 96.4  PLT 976   Basic Metabolic Panel: Recent Labs  Lab 01/17/22 0013  NA 134*  K 4.0  CL 104  CO2 23  GLUCOSE 172*  BUN 16  CREATININE 0.75  CALCIUM 8.7*   GFR: Estimated Creatinine Clearance: 34 mL/min (by C-G formula based on SCr of 0.75 mg/dL). Liver Function Tests: Recent Labs  Lab 01/17/22 0013  AST 31  ALT 22  ALKPHOS 95  BILITOT 0.5  PROT 6.4*  ALBUMIN 3.2*   Recent Labs   Lab 01/17/22 0013  LIPASE 166*   No results for input(s): "AMMONIA" in the last 168 hours. Coagulation Profile: No results for input(s): "INR", "PROTIME" in the last 168 hours. Cardiac Enzymes: No results for input(s): "CKTOTAL", "CKMB", "CKMBINDEX", "TROPONINI" in the last 168 hours. BNP (last 3 results) No results for input(s): "PROBNP" in the last 8760 hours. HbA1C: No results for input(s): "HGBA1C" in the last 72 hours. CBG: No results for input(s): "GLUCAP" in the last 168 hours. Lipid Profile: No results for input(s): "CHOL", "HDL", "LDLCALC", "TRIG", "CHOLHDL", "LDLDIRECT" in the last 72 hours. Thyroid Function Tests: No results for input(s): "TSH", "T4TOTAL", "FREET4", "T3FREE", "THYROIDAB" in the last 72 hours. Anemia Panel: No results for input(s): "VITAMINB12", "FOLATE", "FERRITIN", "TIBC", "IRON", "RETICCTPCT" in the last 72 hours. Urine analysis:    Component Value Date/Time   COLORURINE YELLOW 01/12/2022 0752   APPEARANCEUR HAZY (A) 01/12/2022 0752   LABSPEC 1.010 01/12/2022 0752   PHURINE 6.0 01/12/2022 0752   GLUCOSEU NEGATIVE 01/12/2022 0752   HGBUR NEGATIVE 01/12/2022 0752   BILIRUBINUR NEGATIVE 01/12/2022 0752   KETONESUR NEGATIVE 01/12/2022 0752   PROTEINUR 30 (A) 01/12/2022 0752   NITRITE POSITIVE (A) 01/12/2022 0752   LEUKOCYTESUR LARGE (A) 01/12/2022 0752    Radiological Exams on Admission: CT ABDOMEN PELVIS W CONTRAST  Result Date: 01/18/2022 CLINICAL DATA:  Nausea and vomiting. Lower abdominal pain. Bloating. EXAM: CT ABDOMEN AND PELVIS WITH CONTRAST TECHNIQUE: Multidetector CT imaging of the abdomen and pelvis was performed using the standard protocol following bolus administration of intravenous contrast. RADIATION DOSE REDUCTION: This exam was performed according to the departmental dose-optimization program which includes automated exposure control, adjustment of the mA and/or kV according to patient size and/or use of iterative reconstruction  technique. CONTRAST:  46m OMNIPAQUE IOHEXOL 300 MG/ML  SOLN COMPARISON:  CT pelvis 01/07/2022 FINDINGS:  Lower chest: Mild atelectasis in the lung bases, greater on the right. Hepatobiliary: No focal liver abnormality is seen. No gallstones, gallbladder wall thickening, or biliary dilatation. Pancreas: Scattered calcifications in the pancreatic head likely to represent evidence of chronic pancreatitis. There is a stone in the body of the pancreas likely in the duct with mild proximal pancreatic ductal dilatation. The stone measures 6 mm in diameter. The stone was present on prior CT lumbar spine from 01/07/2022. No stranding around the pancreas. Spleen: Normal in size without focal abnormality. Adrenals/Urinary Tract: No adrenal gland nodules. Cyst on the right kidney measuring 4.6 cm diameter. No follow-up imaging is indicated. No hydronephrosis or hydroureter. Bladder is normal. Stomach/Bowel: Stomach, small bowel, and colon are not abnormally distended. Stool fills the colon. No wall thickening or inflammatory changes. Appendix is not identified. Vascular/Lymphatic: Calcification of the aorta. No aneurysm. No significant lymphadenopathy. Reproductive: Status post hysterectomy. No adnexal masses. Other: No free air or free fluid in the abdomen. Abdominal wall musculature appears intact. Musculoskeletal: Degenerative changes. No acute bony abnormalities. Compression of superior endplate of O97. No change since prior CT lumbar spine from 01/07/2022. IMPRESSION: 1. No evidence of bowel obstruction or inflammation. 2. 6 mm stone in the body of the pancreas likely in the pancreatic duct. Mild proximal pancreatic ductal dilatation. Although ductal dilatation is likely related to the stone, follow-up with elective MRI/MRCP is recommended to exclude an underlying obstructing mass lesion. 3. Calcifications in the head of the pancreas likely indicating evidence of prior chronic pancreatitis. No acute inflammatory changes.  4. Atelectasis in the lung bases. 5. Aortic atherosclerosis. Electronically Signed   By: Lucienne Capers M.D.   On: 01/18/2022 03:28   DG Abdomen Acute W/Chest  Result Date: 01/18/2022 CLINICAL DATA:  Abdominal pain, vomiting EXAM: DG ABDOMEN ACUTE WITH 1 VIEW CHEST COMPARISON:  None Available. FINDINGS: There is no evidence of dilated bowel loops or free intraperitoneal air. No radiopaque calculi or other significant radiographic abnormality is seen. Heart size and mediastinal contours are within normal limits. Both lungs are clear. IMPRESSION: Negative abdominal radiographs.  No acute cardiopulmonary disease. Electronically Signed   By: Fidela Salisbury M.D.   On: 01/18/2022 00:08    EKG: Independently reviewed.  Sinus rhythm, no significant change since prior tracing.  Assessment and Plan  Abdominal pain and emesis CT abdomen pelvis with contrast showing a 6 mm stone in the body of the pancreas likely in the pancreatic duct with mild proximal pancreatic ductal dilatation.  Radiologist feels that the ductal dilatation is likely related to the stone, however, recommending MRCP to exclude an underlying obstructing mass lesion.  In addition, CT showing calcifications in the head of the pancreas likely indicating evidence of prior chronic pancreatitis; no acute inflammatory changes. Lipase 166, LFTs normal.  Lactic acid 1.4> 2.0.  No fever, leukocytosis, or signs of sepsis. -N.p.o. -MRCP -Continue IV fluid hydration -Continue fentanyl as needed for severe pain -Continue Zofran as needed for nausea/vomiting -Trend lactate, lipase, and LFTs  Hyperlipidemia Hypertension: Currently normotensive. Hypothyroidism Mood disorder -Pharmacy med rec pending.  DVT prophylaxis: SCDs Code Status: DNR (confirmed with the patient) Family Communication: No family available at this time. Level of care: Telemetry bed Admission status: It is my clinical opinion that referral for OBSERVATION is reasonable and  necessary in this patient based on the above information provided. The aforementioned taken together are felt to place the patient at high risk for further clinical deterioration. However, it is anticipated that the patient  may be medically stable for discharge from the hospital within 24 to 48 hours.   Shela Leff MD [facility]  If 7PM-7AM, please contact night-coverage [email or web address]  01/18/2022, 4:28 AM

## 2022-01-18 NOTE — Progress Notes (Signed)
Progress Note   Patient: Kristin Bryant XTG:[id number] DOB: January 21, 1923 DOA: 01/17/2022     0 DOS: the patient was seen and examined on 01/18/2022   Brief hospital course: 88 year old woman presenting with abdominal pain and vomiting.  CT showed 6 mm stone in the body of the pancreas, a likely in the pancreatic duct.  Admitted for further evaluation.  Seen by gastroenterology with recommendation for conservative management, no oral for pancreatic enzyme supplementation at this time.  Main treatment is pain control.  If stone extraction were desired, she would have to be referred to a tertiary care center.  Not recommended by GI and family does not desire at this point.  Assessment and Plan: Abdominal pain, vomiting, chronic pancreatitis --MRCP showed sequela of chronic pancreatitis, 4 mm stone in proximal pancreatic duct with proximal dilatation.  No acute pancreatitis or suspicious pancreatic lesion. --Seen by GI, no endoscopic intervention recommended given age and high risk for injury of the duct or leak.  Recommended pain control. --In the absence of diarrhea and pancreatic insufficiency, no pancreatic enzymes at this time.  Recurrent falls prior to admission, possible syncope --Suspect orthostasis based on history.  Stop Lotensin.  Check orthostatics.  Therapy evaluation.  UTI diagnosed prior to admission, being treated with Keflex as an outpatient --Complete treatment.   Essential hypertension --With history suggestive of orthostasis and given advanced age and multiple falls, stop Lotensin.  Hypothyroidism --Continue levothyroxine.  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 --known prior to admission. Pain control    Subjective:  Feels better now No pain currently in abdomen No n/v Multiple falls in last month per husband Perhaps syncope x2  Physical Exam: Vitals:   01/18/22 0638 01/18/22 0702 01/18/22 0918 01/18/22  1444  BP: (!) 156/82  (!) 140/70 (!) 141/57  Pulse: 80  68 67  Resp: '16  20 20  '$ Temp: 97.8 F (36.6 C)  98.4 F (36.9 C) 98.6 F (37 C)  TempSrc: Oral  Oral Oral  SpO2: 98%  98% 98%  Weight:  64.1 kg    Height:  '5\' 2"'$  (1.575 m)     Physical Exam Vitals reviewed.  Constitutional:      General: She is not in acute distress.    Appearance: She is not ill-appearing or toxic-appearing.  Cardiovascular:     Rate and Rhythm: Normal rate and regular rhythm.     Heart sounds: No murmur heard. Pulmonary:     Effort: Pulmonary effort is normal. No respiratory distress.     Breath sounds: No wheezing, rhonchi or rales.  Abdominal:     General: There is no distension.     Palpations: Abdomen is soft.     Tenderness: There is no abdominal tenderness.  Musculoskeletal:     Right lower leg: No edema.     Left lower leg: No edema.  Neurological:     Mental Status: She is alert.  Psychiatric:        Mood and Affect: Mood normal.        Behavior: Behavior normal.     Data Reviewed:  CMP noted Lipase 166 > 101 Repeat lactic acid 1.7 Hgb stable 11.7 MRCP noted  Family Communication: husband and daughter at bedside  Disposition: Status is: Observation   Planned Discharge Destination: [facility] [place]     Time spent: 35 minutes  Author: Murray Hodgkins, MD 01/18/2022 5:58 PM  For on call review  [email or web address]

## 2022-01-18 NOTE — Consult Note (Addendum)
[facility] Gastroenterology Consult  Referring Provider: No ref. provider found Primary Care Physician:  Earmon Phoenix, NP Primary Gastroenterologist: Althia Forts  Reason for Consultation: Abdominal pain, abnormal CT imaging  SUBJECTIVE:   HPI: Kristin Bryant is a 88 y.o. female with past medical history significant for breast cancer in 2020, hyperlipidemia, hypertension and osteoporosis.  She presented to [facility] on 01/12/2022 with abdominal pain and emesis.  I spoke with her son, Mikki Santee, and daughter-in-law, Wells Guiles, over the telephone for supplemental information.  They note that she has been having frequent falls and has been having some back pain.  She has struggled with constipation in the outpatient setting.  They are concerned about pain control for her.  Current labs show WBC 7.2, hemoglobin 11.7, platelets 259, AST/ALT 29/20, ALP 101, total bilirubin 0.5, lactic acidosis 1.7.  CT scan of abdomen pelvis completed on 01/18/2022 with no evidence of bowel obstruction or inflammation, 6 mm stone in the body of the pancreas likely in the pancreatic duct, mild proximal pancreatic ductal dilatation, calcifications in the head of the pancreas likely indicating prior chronic pancreatitis.  Follow-up MRCP completed on 01/18/2022 showed sequela of chronic pancreatitis with 4 mm stone in the proximal pancreatic duct and upstream ductal dilatation measuring 4 mm, no evidence of acute pancreatitis or suspicious pancreatic lesion, large periampullary duodenal diverticulum.  Patient seen and evaluated at bedside today, history very limited as she is falling asleep on exam.  Nursing notes at bedside that she had a mushy nonbloody bowel movement this morning.  Patient is able to state that she has upper abdominal pain.  Unknown prior endoscopic history.  Past Medical History:  Diagnosis Date   Breast cancer ([place]) 12/2018   right   Elevated cholesterol    Hypertension    Hypothyroidism    Osteoporosis     Past Surgical History:  Procedure Laterality Date   APPENDECTOMY     BACK SURGERY     CATARACT EXTRACTION     VAGINAL HYSTERECTOMY     Prior to Admission medications   Medication Sig Start Date End Date Taking? Authorizing Provider  acetaminophen (TYLENOL) 500 MG tablet Take 1,000 mg by mouth every 8 (eight) hours.   Yes [provider]  benazepril (LOTENSIN) 20 MG tablet Take 20 mg by mouth daily.   Yes [provider]  bisacodyl (DULCOLAX) 5 MG EC tablet Take 1 tablet (5 mg total) by mouth daily as needed for moderate constipation (if no bowel movement with miralax and stool softner). Patient taking differently: Take 5 mg by mouth daily as needed for moderate constipation. 03/14/21 03/14/22 Yes Pokhrel, Laxman, MD  cephALEXin (KEFLEX) 500 MG capsule Take 1 capsule (500 mg total) by mouth 3 (three) times daily. Patient taking differently: Take 500 mg by mouth 3 (three) times daily. 7 day course. Pt on day 5. 01/12/22  Yes Nanavati, Ankit, MD  Cholecalciferol (VITAMIN D-3) 125 MCG (5000 UT) TABS Take 5,000 Units by mouth daily.   Yes [provider]  diphenhydrAMINE (BENADRYL) 25 MG tablet Take 25 mg by mouth every 8 (eight) hours as needed for itching or allergies.   Yes [provider]  docusate sodium (COLACE) 100 MG capsule Take 1 capsule (100 mg total) by mouth every 12 (twelve) hours. Patient taking differently: Take 100 mg by mouth 2 (two) times daily. 07/29/20  Yes Little, Wenda Overland, MD  HYDROcodone-acetaminophen [facility]) 5-325 MG tablet Take 1-2 tablets by mouth every 6 (six) hours as needed. Patient taking  differently: Take 1-2 tablets by mouth every 6 (six) hours as needed (pain). 01/08/22  Yes Delo, Nathaneil Canary, MD  latanoprost (XALATAN) 0.005 % ophthalmic solution Place 1 drop into both eyes at bedtime.   Yes [provider]  levothyroxine (SYNTHROID) 88 MCG tablet Take 88 mcg by mouth daily. 12/08/21  Yes [provider]   lidocaine 4 % Place 2 patches onto the skin in the morning. To right ankle, lower back.   Yes [provider]  Multiple Vitamins-Minerals (PRESERVISION AREDS 2 PO) Take 1 capsule by mouth daily.   Yes [provider]  polyethylene glycol powder (GLYCOLAX/MIRALAX) 17 GM/SCOOP powder Mix 1 capful in a drink and take by mouth 1-3 times daily until daily soft stools  OTC Patient taking differently: Take 17 g by mouth in the morning and at bedtime. 07/29/20  Yes Little, Wenda Overland, MD  QUEtiapine (SEROQUEL) 25 MG tablet Take 25 mg by mouth at bedtime. 12/08/21  Yes [provider]  sertraline (ZOLOFT) 25 MG tablet Take 25 mg by mouth daily. 12/08/21  Yes [provider]  timolol (TIMOPTIC) 0.5 % ophthalmic solution Place 1 drop into both eyes 2 (two) times daily.   Yes [provider]  traMADol (ULTRAM) 50 MG tablet Take 25 mg by mouth daily. 12/11/21  Yes [provider]   Current Facility-Administered Medications  Medication Dose Route Frequency Provider Last Rate Last Admin   0.9 %  sodium chloride infusion   Intravenous Continuous Shela Leff, MD 75 mL/hr at 01/18/22 0654 New Bag at 01/18/22 0654   fentaNYL (SUBLIMAZE) injection 12.5 mcg  12.5 mcg Intravenous Q2H PRN Shela Leff, MD   12.5 mcg at 01/18/22 0701   naloxone [facility]) injection 0.4 mg  0.4 mg Intravenous PRN Shela Leff, MD       ondansetron (ZOFRAN) injection 4 mg  4 mg Intravenous Q6H PRN Shela Leff, MD       sodium chloride (PF) 0.9 % injection            Allergies as of 01/17/2022 - Review Complete 01/17/2022  Allergen Reaction Noted   Acetaminophen  08/24/2020   Aspirin  10/15/2016   Benzonatate Nausea Only 10/15/2016   Celexa [citalopram]  10/15/2016   Chlorzoxazone  10/15/2016   Citalopram hydrobromide  08/24/2020   Cortisone  08/24/2020   Diclofenac-misoprostol  10/15/2016   Escitalopram oxalate  08/24/2020   Guaifenesin  08/24/2020    Guaifenesin & derivatives Nausea Only 10/15/2016   Hydrocodone  10/15/2016   Ipratropium bromide  08/24/2020   [place] of [place] herb [convallariae majalis]  10/15/2016   Mirtazapine  10/15/2016   Naproxen  10/15/2016   Neosporin [bacitracin-polymyxin b]  08/24/2020   Prednisone  08/24/2020   Pseudoephedrine  08/24/2020   Ranitidine Nausea Only and Other (See Comments) 10/15/2016   Ranitidine hcl  08/24/2020   Sudafed [pseudoephedrine hcl]  10/15/2016   Sulfa antibiotics  10/15/2016   Epinephrine Palpitations 10/15/2016   Latex Rash 10/15/2016   Family History  Problem Relation Age of Onset   Stomach cancer Father    Breast cancer Sister    Prostate cancer Brother    Diabetes Son    Social History   Socioeconomic History   Marital status: Widowed    Spouse name: Not on file   Number of children: Not on file   Years of education: Not on file   Highest education level: Not on file  Occupational History   Not on file  Tobacco  Use   Smoking status: Former    Types: Cigarettes   Smokeless tobacco: Never  Substance and Sexual Activity   Alcohol use: Not on file   Drug use: Not on file   Sexual activity: Not on file  Other Topics Concern   Not on file  Social History Narrative   Not on file   Social Determinants of Health   Financial Resource Strain: Not on file  Food Insecurity: Not on file  Transportation Needs: Not on file  Physical Activity: Not on file  Stress: Not on file  Social Connections: Not on file  Intimate Partner Violence: Not on file   Review of Systems:  Review of Systems  Constitutional:  Positive for weight loss (Per family discussion).  Gastrointestinal:  Positive for abdominal pain and constipation (Per discussion with family).    OBJECTIVE:   Temp:  [97.6 F (36.4 C)-98.4 F (36.9 C)] 98.4 F (36.9 C) (10/12 0918) Pulse Rate:  [68-103] 68 (10/12 0918) Resp:  [13-22] 20 (10/12 0918) BP: (99-156)/(42-88) 140/70 (10/12 0918) SpO2:   [94 %-99 %] 98 % (10/12 0918) Weight:  [56.7 kg-64.1 kg] 64.1 kg (10/12 0702) Last BM Date : 01/18/22 Physical Exam Constitutional:      General: She is not in acute distress.    Appearance: She is not toxic-appearing.  Cardiovascular:     Rate and Rhythm: Normal rate and regular rhythm.  Pulmonary:     Effort: No respiratory distress.     Breath sounds: Normal breath sounds.  Abdominal:     General: Bowel sounds are normal. There is no distension.     Palpations: Abdomen is soft.     Tenderness: There is abdominal tenderness.  Neurological:     Mental Status: She is easily aroused. She is lethargic.     Labs: Recent Labs    01/17/22 0013 01/18/22 0650  WBC 10.1 7.2  HGB 11.8* 11.7*  HCT 34.9* 35.6*  PLT 272 259   BMET Recent Labs    01/17/22 0013 01/18/22 0650  NA 134* 137  K 4.0 4.4  CL 104 106  CO2 23 25  GLUCOSE 172* 121*  BUN 16 13  CREATININE 0.75 0.64  CALCIUM 8.7* 8.6*   LFT Recent Labs    01/18/22 0650  PROT 6.5  ALBUMIN 3.3*  AST 29  ALT 20  ALKPHOS 93  BILITOT 0.5   PT/INR No results for input(s): "LABPROT", "INR" in the last 72 hours.  Diagnostic imaging: MR ABDOMEN MRCP W WO CONTAST  Result Date: 01/18/2022 CLINICAL DATA:  Pancreatitis suspected, pancreatic duct stone with dilation of the proximal duct. EXAM: MRI ABDOMEN WITHOUT AND WITH CONTRAST (INCLUDING MRCP) TECHNIQUE: Multiplanar multisequence MR imaging of the abdomen was performed both before and after the administration of intravenous contrast. Heavily T2-weighted images of the biliary and pancreatic ducts were obtained, and three-dimensional MRCP images were rendered by post processing. CONTRAST:  40m GADAVIST GADOBUTROL 1 MMOL/ML IV SOLN COMPARISON:  CT abdomen pelvis January 18, 2022. FINDINGS: Despite efforts by the technologist and patient, motion artifact is present on today's exam and could not be eliminated. This reduces exam sensitivity and specificity. Lower chest: No acute  abnormality. Hepatobiliary: No significant hepatic steatosis. No suspicious hepatic lesion. Gallbladder is unremarkable. No biliary ductal dilation. Pancreas: Diffuse atrophy of the pancreatic gland. 4 mm stone in the proximal pancreatic duct in the genu of the pancreas with upstream ductal dilation measuring 4 mm in maximal diameter. There are few other scattered  pancreatic calcifications. No suspicious pancreatic lesion identified on this motion degraded examination. Spleen:  No splenomegaly or focal splenic lesion. Adrenals/Urinary Tract: Bilateral adrenal glands appear normal. No hydronephrosis. No solid enhancing renal mass. Well-circumscribed fluid signal exophytic left renal cyst measures 5.5 x 3.5 cm without suspicious postcontrast enhancement or wall thickening/nodularity consistent with a benign Bosniak classification 1 renal cyst requiring no independent imaging follow-up. Stomach/Bowel: Large periampullary duodenal diverticulum. No pathologic dilation or evidence of acute inflammation involving loops of large or small bowel in the abdomen. Vascular/Lymphatic: Normal caliber abdominal aorta. The portal, splenic and superior mesenteric veins are patent. No pathologically enlarged abdominal lymph nodes Other:  No significant abdominal free fluid Musculoskeletal: Similar appearance of the acute T12 compression deformity with a proximally 40% height loss. Few scattered vertebral body hemangiomata. Multilevel degenerative changes spine. IMPRESSION: 1. Sequela of chronic pancreatitis with 4 mm stone in the proximal pancreatic duct and upstream ductal dilation measuring 4 mm. No evidence of acute pancreatitis or suspicious pancreatic lesion identified on this motion degraded examination. 2. Large periampullary duodenal diverticulum. 3. Similar appearance of the acute T12 compression deformity with a proximally 40% height loss. Electronically Signed   By: Dahlia Bailiff M.D.   On: 01/18/2022 10:30   MR 3D Recon  At Scanner  Result Date: 01/18/2022 CLINICAL DATA:  Pancreatitis suspected, pancreatic duct stone with dilation of the proximal duct. EXAM: MRI ABDOMEN WITHOUT AND WITH CONTRAST (INCLUDING MRCP) TECHNIQUE: Multiplanar multisequence MR imaging of the abdomen was performed both before and after the administration of intravenous contrast. Heavily T2-weighted images of the biliary and pancreatic ducts were obtained, and three-dimensional MRCP images were rendered by post processing. CONTRAST:  72m GADAVIST GADOBUTROL 1 MMOL/ML IV SOLN COMPARISON:  CT abdomen pelvis January 18, 2022. FINDINGS: Despite efforts by the technologist and patient, motion artifact is present on today's exam and could not be eliminated. This reduces exam sensitivity and specificity. Lower chest: No acute abnormality. Hepatobiliary: No significant hepatic steatosis. No suspicious hepatic lesion. Gallbladder is unremarkable. No biliary ductal dilation. Pancreas: Diffuse atrophy of the pancreatic gland. 4 mm stone in the proximal pancreatic duct in the genu of the pancreas with upstream ductal dilation measuring 4 mm in maximal diameter. There are few other scattered pancreatic calcifications. No suspicious pancreatic lesion identified on this motion degraded examination. Spleen:  No splenomegaly or focal splenic lesion. Adrenals/Urinary Tract: Bilateral adrenal glands appear normal. No hydronephrosis. No solid enhancing renal mass. Well-circumscribed fluid signal exophytic left renal cyst measures 5.5 x 3.5 cm without suspicious postcontrast enhancement or wall thickening/nodularity consistent with a benign Bosniak classification 1 renal cyst requiring no independent imaging follow-up. Stomach/Bowel: Large periampullary duodenal diverticulum. No pathologic dilation or evidence of acute inflammation involving loops of large or small bowel in the abdomen. Vascular/Lymphatic: Normal caliber abdominal aorta. The portal, splenic and superior  mesenteric veins are patent. No pathologically enlarged abdominal lymph nodes Other:  No significant abdominal free fluid Musculoskeletal: Similar appearance of the acute T12 compression deformity with a proximally 40% height loss. Few scattered vertebral body hemangiomata. Multilevel degenerative changes spine. IMPRESSION: 1. Sequela of chronic pancreatitis with 4 mm stone in the proximal pancreatic duct and upstream ductal dilation measuring 4 mm. No evidence of acute pancreatitis or suspicious pancreatic lesion identified on this motion degraded examination. 2. Large periampullary duodenal diverticulum. 3. Similar appearance of the acute T12 compression deformity with a proximally 40% height loss. Electronically Signed   By: JDahlia BailiffM.D.   On: 01/18/2022 10:30  CT ABDOMEN PELVIS W CONTRAST  Result Date: 01/18/2022 CLINICAL DATA:  Nausea and vomiting. Lower abdominal pain. Bloating. EXAM: CT ABDOMEN AND PELVIS WITH CONTRAST TECHNIQUE: Multidetector CT imaging of the abdomen and pelvis was performed using the standard protocol following bolus administration of intravenous contrast. RADIATION DOSE REDUCTION: This exam was performed according to the departmental dose-optimization program which includes automated exposure control, adjustment of the mA and/or kV according to patient size and/or use of iterative reconstruction technique. CONTRAST:  26m OMNIPAQUE IOHEXOL 300 MG/ML  SOLN COMPARISON:  CT pelvis 01/07/2022 FINDINGS: Lower chest: Mild atelectasis in the lung bases, greater on the right. Hepatobiliary: No focal liver abnormality is seen. No gallstones, gallbladder wall thickening, or biliary dilatation. Pancreas: Scattered calcifications in the pancreatic head likely to represent evidence of chronic pancreatitis. There is a stone in the body of the pancreas likely in the duct with mild proximal pancreatic ductal dilatation. The stone measures 6 mm in diameter. The stone was present on prior CT  lumbar spine from 01/07/2022. No stranding around the pancreas. Spleen: Normal in size without focal abnormality. Adrenals/Urinary Tract: No adrenal gland nodules. Cyst on the right kidney measuring 4.6 cm diameter. No follow-up imaging is indicated. No hydronephrosis or hydroureter. Bladder is normal. Stomach/Bowel: Stomach, small bowel, and colon are not abnormally distended. Stool fills the colon. No wall thickening or inflammatory changes. Appendix is not identified. Vascular/Lymphatic: Calcification of the aorta. No aneurysm. No significant lymphadenopathy. Reproductive: Status post hysterectomy. No adnexal masses. Other: No free air or free fluid in the abdomen. Abdominal wall musculature appears intact. Musculoskeletal: Degenerative changes. No acute bony abnormalities. Compression of superior endplate of TQ03 No change since prior CT lumbar spine from 01/07/2022. IMPRESSION: 1. No evidence of bowel obstruction or inflammation. 2. 6 mm stone in the body of the pancreas likely in the pancreatic duct. Mild proximal pancreatic ductal dilatation. Although ductal dilatation is likely related to the stone, follow-up with elective MRI/MRCP is recommended to exclude an underlying obstructing mass lesion. 3. Calcifications in the head of the pancreas likely indicating evidence of prior chronic pancreatitis. No acute inflammatory changes. 4. Atelectasis in the lung bases. 5. Aortic atherosclerosis. Electronically Signed   By: WLucienne CapersM.D.   On: 01/18/2022 03:28   DG Abdomen Acute W/Chest  Result Date: 01/18/2022 CLINICAL DATA:  Abdominal pain, vomiting EXAM: DG ABDOMEN ACUTE WITH 1 VIEW CHEST COMPARISON:  None Available. FINDINGS: There is no evidence of dilated bowel loops or free intraperitoneal air. No radiopaque calculi or other significant radiographic abnormality is seen. Heart size and mediastinal contours are within normal limits. Both lungs are clear. IMPRESSION: Negative abdominal radiographs.   No acute cardiopulmonary disease. Electronically Signed   By: AFidela SalisburyM.D.   On: 01/18/2022 00:08    IMPRESSION: Intractable abdominal pain Constipation Proximal pancreatic duct stone (4 mm) with upstream ductal dilatation (4 mm) Findings suggestive of chronic pancreatitis Back pain, history T12 compression deformity Lactic acidosis, resolved Normocytic anemia  PLAN: -No findings of pancreatic mass on MRCP imaging -Sequela of chronic pancreatitis noted on MRCP imaging -Tramadol can be used for analgesia in chronic pancreatitis -Pancreatic enzyme therapy has mixed results with analgesia for chronic pancreatitis, will hold off for now -Above could be causing patient's abdominal pain, constipation is also a consideration -Patient will be high risk for any biliary/pancreatic endoscopic intervention given advanced age as well as duodenal diverticulum noted on imaging, discussed this with family -Will discuss usefulness of biliary/pancreatic endoscopic intervention this patient with  advanced endoscopy providers -Optimize bowel regimen -Diet as followed -GI will follow   LOS: 0 days   Danton Clap, DO [facility]  INTERVAL UPDATE: 12:09 PM Discussed patient's clinical status with advanced endoscopy provider, Dr. Ronnette Juniper.  Do not recommend biliary/pancreatic endoscopic intervention in this patient given age and high risk for pancreatic duct injury/leak.  If endoscopic therapy was pursued this would need to be completed at a tertiary care center.  Would recommend aggressive pain management with opioids (as able knowing advanced age and risk of respiratory depression), and consider tramadol in the setting.  As no signs of diarrhea or pancreatic insufficiency, will hold off on pancreatic enzyme therapy for now.  Have ordered bowel regimen.  I personally discussed this plan of care with patient's son, Mikki Santee over the telephone.  He had no further questions.  Danton Clap,  DO [facility]

## 2022-01-18 NOTE — Plan of Care (Signed)
  Problem: Education: Goal: Knowledge of General Education information will improve Description Including pain rating scale, medication(s)/side effects and non-pharmacologic comfort measures Outcome: Progressing   Problem: Health Behavior/Discharge Planning: Goal: Ability to manage health-related needs will improve Outcome: Progressing   

## 2022-01-19 DIAGNOSIS — Z87891 Personal history of nicotine dependence: Secondary | ICD-10-CM | POA: Diagnosis not present

## 2022-01-19 DIAGNOSIS — K861 Other chronic pancreatitis: Secondary | ICD-10-CM | POA: Diagnosis not present

## 2022-01-19 DIAGNOSIS — E7800 Pure hypercholesterolemia, unspecified: Secondary | ICD-10-CM | POA: Diagnosis present

## 2022-01-19 DIAGNOSIS — I951 Orthostatic hypotension: Secondary | ICD-10-CM | POA: Diagnosis present

## 2022-01-19 DIAGNOSIS — S22080D Wedge compression fracture of T11-T12 vertebra, subsequent encounter for fracture with routine healing: Secondary | ICD-10-CM | POA: Diagnosis not present

## 2022-01-19 DIAGNOSIS — Z66 Do not resuscitate: Secondary | ICD-10-CM | POA: Diagnosis present

## 2022-01-19 DIAGNOSIS — E039 Hypothyroidism, unspecified: Secondary | ICD-10-CM | POA: Diagnosis present

## 2022-01-19 DIAGNOSIS — K8689 Other specified diseases of pancreas: Secondary | ICD-10-CM | POA: Diagnosis not present

## 2022-01-19 DIAGNOSIS — Z79890 Hormone replacement therapy (postmenopausal): Secondary | ICD-10-CM | POA: Diagnosis not present

## 2022-01-19 DIAGNOSIS — D649 Anemia, unspecified: Secondary | ICD-10-CM | POA: Diagnosis present

## 2022-01-19 DIAGNOSIS — Z882 Allergy status to sulfonamides status: Secondary | ICD-10-CM | POA: Diagnosis not present

## 2022-01-19 DIAGNOSIS — Y92009 Unspecified place in unspecified non-institutional (private) residence as the place of occurrence of the external cause: Secondary | ICD-10-CM | POA: Diagnosis not present

## 2022-01-19 DIAGNOSIS — Z888 Allergy status to other drugs, medicaments and biological substances status: Secondary | ICD-10-CM | POA: Diagnosis not present

## 2022-01-19 DIAGNOSIS — K5900 Constipation, unspecified: Secondary | ICD-10-CM | POA: Diagnosis present

## 2022-01-19 DIAGNOSIS — R109 Unspecified abdominal pain: Secondary | ICD-10-CM | POA: Diagnosis not present

## 2022-01-19 DIAGNOSIS — Z853 Personal history of malignant neoplasm of breast: Secondary | ICD-10-CM | POA: Diagnosis not present

## 2022-01-19 DIAGNOSIS — Z9181 History of falling: Secondary | ICD-10-CM | POA: Diagnosis not present

## 2022-01-19 DIAGNOSIS — R933 Abnormal findings on diagnostic imaging of other parts of digestive tract: Secondary | ICD-10-CM | POA: Diagnosis not present

## 2022-01-19 DIAGNOSIS — Z885 Allergy status to narcotic agent status: Secondary | ICD-10-CM | POA: Diagnosis not present

## 2022-01-19 DIAGNOSIS — M4854XA Collapsed vertebra, not elsewhere classified, thoracic region, initial encounter for fracture: Secondary | ICD-10-CM | POA: Diagnosis present

## 2022-01-19 DIAGNOSIS — E872 Acidosis, unspecified: Secondary | ICD-10-CM | POA: Diagnosis present

## 2022-01-19 DIAGNOSIS — Z91040 Latex allergy status: Secondary | ICD-10-CM | POA: Diagnosis not present

## 2022-01-19 DIAGNOSIS — Y92099 Unspecified place in other non-institutional residence as the place of occurrence of the external cause: Secondary | ICD-10-CM | POA: Diagnosis not present

## 2022-01-19 DIAGNOSIS — I10 Essential (primary) hypertension: Secondary | ICD-10-CM | POA: Diagnosis present

## 2022-01-19 DIAGNOSIS — Z90710 Acquired absence of both cervix and uterus: Secondary | ICD-10-CM | POA: Diagnosis not present

## 2022-01-19 DIAGNOSIS — Z9049 Acquired absence of other specified parts of digestive tract: Secondary | ICD-10-CM | POA: Diagnosis not present

## 2022-01-19 DIAGNOSIS — N390 Urinary tract infection, site not specified: Secondary | ICD-10-CM | POA: Diagnosis present

## 2022-01-19 DIAGNOSIS — F39 Unspecified mood [affective] disorder: Secondary | ICD-10-CM | POA: Diagnosis present

## 2022-01-19 DIAGNOSIS — M810 Age-related osteoporosis without current pathological fracture: Secondary | ICD-10-CM | POA: Diagnosis present

## 2022-01-19 DIAGNOSIS — W19XXXA Unspecified fall, initial encounter: Secondary | ICD-10-CM | POA: Diagnosis not present

## 2022-01-19 DIAGNOSIS — Z803 Family history of malignant neoplasm of breast: Secondary | ICD-10-CM | POA: Diagnosis not present

## 2022-01-19 MED ORDER — PANCRELIPASE (LIP-PROT-AMYL) 12000-38000 UNITS PO CPEP
24000.0000 [IU] | ORAL_CAPSULE | Freq: Three times a day (TID) | ORAL | Status: DC
  Administered 2022-01-19 – 2022-01-20 (×2): 24000 [IU] via ORAL
  Filled 2022-01-19 (×2): qty 2

## 2022-01-19 MED ORDER — ACETAMINOPHEN 325 MG PO TABS
650.0000 mg | ORAL_TABLET | ORAL | Status: DC
  Administered 2022-01-19 – 2022-01-20 (×7): 650 mg via ORAL
  Filled 2022-01-19 (×7): qty 2

## 2022-01-19 NOTE — Progress Notes (Signed)
[facility] Progress Note  SUBJECTIVE:   Interval history: Kristin Bryant was seen and evaluated today at bedside.  He was seated in bed eating breakfast with no issue.  She denied any abdominal pain.  She noted having some low back pain.  She is eager to get up and move.  She denied any chest pain or shortness of breath.  She noted having a soft bowel movement yesterday.  She noted having some bilateral inguinal discomfort with movement.  Pain regimen reviewed including tramadol and fentanyl as needed.  Past Medical History:  Diagnosis Date   Breast cancer ([place]) 12/2018   right   Chronic pancreatitis ([place]) 01/18/2022   Elevated cholesterol    Hypertension    Hypothyroidism    Osteoporosis    Thoracic compression fracture ([place]) 01/18/2022   Past Surgical History:  Procedure Laterality Date   APPENDECTOMY     BACK SURGERY     CATARACT EXTRACTION     VAGINAL HYSTERECTOMY     Current Facility-Administered Medications  Medication Dose Route Frequency Provider Last Rate Last Admin   0.9 %  sodium chloride infusion   Intravenous Continuous Shela Leff, MD 75 mL/hr at 01/19/22 0412 New Bag at 01/19/22 0412   acetaminophen (TYLENOL) tablet 650 mg  650 mg Oral Q6H PRN Samuella Cota, MD   650 mg at 01/18/22 2342   cephALEXin (KEFLEX) capsule 500 mg  500 mg Oral TID Samuella Cota, MD   500 mg at 01/19/22 0925   fentaNYL (SUBLIMAZE) injection 12.5 mcg  12.5 mcg Intravenous Q2H PRN Shela Leff, MD   12.5 mcg at 01/19/22 0803   latanoprost (XALATAN) 0.005 % ophthalmic solution 1 drop  1 drop Both Eyes QHS Samuella Cota, MD   1 drop at 01/18/22 2300   levothyroxine (SYNTHROID) tablet 88 mcg  88 mcg Oral Daily Samuella Cota, MD   88 mcg at 01/19/22 0620   lidocaine (LIDODERM) 5 % 2 patch  2 patch Transdermal q AM Samuella Cota, MD   2 patch at 01/19/22 0620   naloxone (NARCAN) injection 0.4 mg  0.4 mg Intravenous PRN Shela Leff, MD        ondansetron (ZOFRAN) injection 4 mg  4 mg Intravenous Q6H PRN Shela Leff, MD       polyethylene glycol (MIRALAX / GLYCOLAX) packet 17 g  17 g Oral Daily Danton Clap H, DO   17 g at 01/19/22 4627   sertraline (ZOLOFT) tablet 25 mg  25 mg Oral Daily Samuella Cota, MD   25 mg at 01/19/22 0925   timolol (TIMOPTIC) 0.5 % ophthalmic solution 1 drop  1 drop Both Eyes BID Samuella Cota, MD   1 drop at 01/19/22 0350   traMADol (ULTRAM) tablet 25 mg  25 mg Oral Daily Samuella Cota, MD   25 mg at 01/19/22 0925   traMADol (ULTRAM) tablet 25 mg  25 mg Oral Q8H PRN Samuella Cota, MD   25 mg at 01/18/22 2342   Allergies as of 01/17/2022 - Review Complete 01/17/2022  Allergen Reaction Noted   Acetaminophen  08/24/2020   Aspirin  10/15/2016   Benzonatate Nausea Only 10/15/2016   Celexa [citalopram]  10/15/2016   Chlorzoxazone  10/15/2016   Citalopram hydrobromide  08/24/2020   Cortisone  08/24/2020   Diclofenac-misoprostol  10/15/2016   Escitalopram oxalate  08/24/2020   Guaifenesin  08/24/2020   Guaifenesin & derivatives Nausea Only 10/15/2016   Hydrocodone  10/15/2016  Ipratropium bromide  08/24/2020   Lily of the valley herb [convallariae majalis]  10/15/2016   Mirtazapine  10/15/2016   Naproxen  10/15/2016   Neosporin [bacitracin-polymyxin b]  08/24/2020   Prednisone  08/24/2020   Pseudoephedrine  08/24/2020   Ranitidine Nausea Only and Other (See Comments) 10/15/2016   Ranitidine hcl  08/24/2020   Sudafed [pseudoephedrine hcl]  10/15/2016   Sulfa antibiotics  10/15/2016   Epinephrine Palpitations 10/15/2016   Latex Rash 10/15/2016   Review of Systems:  Review of Systems  Respiratory:  Negative for shortness of breath.   Cardiovascular:  Negative for chest pain.  Gastrointestinal:  Negative for abdominal pain, constipation, diarrhea, nausea and vomiting.    OBJECTIVE:   Temp:  [98 F (36.7 C)-99.1 F (37.3 C)] 98 F (36.7 C) (10/13 1209) Pulse Rate:   [65-79] 69 (10/13 1209) Resp:  [18-20] 18 (10/13 1209) BP: (126-141)/(57-77) 138/66 (10/13 1209) SpO2:  [94 %-98 %] 97 % (10/13 1209) Last BM Date : 01/18/22 Physical Exam Constitutional:      General: She is not in acute distress.    Appearance: She is not ill-appearing, toxic-appearing or diaphoretic.  Cardiovascular:     Rate and Rhythm: Normal rate and regular rhythm.  Pulmonary:     Effort: No respiratory distress.     Breath sounds: Normal breath sounds.  Abdominal:     General: Bowel sounds are normal. There is no distension.     Palpations: Abdomen is soft.     Tenderness: There is no abdominal tenderness. There is no guarding.  Skin:    General: Skin is warm and dry.  Neurological:     Mental Status: She is alert.     Labs: Recent Labs    01/17/22 0013 01/18/22 0650  WBC 10.1 7.2  HGB 11.8* 11.7*  HCT 34.9* 35.6*  PLT 272 259   BMET Recent Labs    01/17/22 0013 01/18/22 0650  NA 134* 137  K 4.0 4.4  CL 104 106  CO2 23 25  GLUCOSE 172* 121*  BUN 16 13  CREATININE 0.75 0.64  CALCIUM 8.7* 8.6*   LFT Recent Labs    01/18/22 0650  PROT 6.5  ALBUMIN 3.3*  AST 29  ALT 20  ALKPHOS 93  BILITOT 0.5   PT/INR No results for input(s): "LABPROT", "INR" in the last 72 hours. Diagnostic imaging: MR ABDOMEN MRCP W WO CONTAST  Result Date: 01/18/2022 CLINICAL DATA:  Pancreatitis suspected, pancreatic duct stone with dilation of the proximal duct. EXAM: MRI ABDOMEN WITHOUT AND WITH CONTRAST (INCLUDING MRCP) TECHNIQUE: Multiplanar multisequence MR imaging of the abdomen was performed both before and after the administration of intravenous contrast. Heavily T2-weighted images of the biliary and pancreatic ducts were obtained, and three-dimensional MRCP images were rendered by post processing. CONTRAST:  30m GADAVIST GADOBUTROL 1 MMOL/ML IV SOLN COMPARISON:  CT abdomen pelvis January 18, 2022. FINDINGS: Despite efforts by the technologist and patient, motion  artifact is present on today's exam and could not be eliminated. This reduces exam sensitivity and specificity. Lower chest: No acute abnormality. Hepatobiliary: No significant hepatic steatosis. No suspicious hepatic lesion. Gallbladder is unremarkable. No biliary ductal dilation. Pancreas: Diffuse atrophy of the pancreatic gland. 4 mm stone in the proximal pancreatic duct in the genu of the pancreas with upstream ductal dilation measuring 4 mm in maximal diameter. There are few other scattered pancreatic calcifications. No suspicious pancreatic lesion identified on this motion degraded examination. Spleen:  No splenomegaly or focal splenic  lesion. Adrenals/Urinary Tract: Bilateral adrenal glands appear normal. No hydronephrosis. No solid enhancing renal mass. Well-circumscribed fluid signal exophytic left renal cyst measures 5.5 x 3.5 cm without suspicious postcontrast enhancement or wall thickening/nodularity consistent with a benign Bosniak classification 1 renal cyst requiring no independent imaging follow-up. Stomach/Bowel: Large periampullary duodenal diverticulum. No pathologic dilation or evidence of acute inflammation involving loops of large or small bowel in the abdomen. Vascular/Lymphatic: Normal caliber abdominal aorta. The portal, splenic and superior mesenteric veins are patent. No pathologically enlarged abdominal lymph nodes Other:  No significant abdominal free fluid Musculoskeletal: Similar appearance of the acute T12 compression deformity with a proximally 40% height loss. Few scattered vertebral body hemangiomata. Multilevel degenerative changes spine. IMPRESSION: 1. Sequela of chronic pancreatitis with 4 mm stone in the proximal pancreatic duct and upstream ductal dilation measuring 4 mm. No evidence of acute pancreatitis or suspicious pancreatic lesion identified on this motion degraded examination. 2. Large periampullary duodenal diverticulum. 3. Similar appearance of the acute T12  compression deformity with a proximally 40% height loss. Electronically Signed   By: Dahlia Bailiff M.D.   On: 01/18/2022 10:30   MR 3D Recon At Scanner  Result Date: 01/18/2022 CLINICAL DATA:  Pancreatitis suspected, pancreatic duct stone with dilation of the proximal duct. EXAM: MRI ABDOMEN WITHOUT AND WITH CONTRAST (INCLUDING MRCP) TECHNIQUE: Multiplanar multisequence MR imaging of the abdomen was performed both before and after the administration of intravenous contrast. Heavily T2-weighted images of the biliary and pancreatic ducts were obtained, and three-dimensional MRCP images were rendered by post processing. CONTRAST:  64m GADAVIST GADOBUTROL 1 MMOL/ML IV SOLN COMPARISON:  CT abdomen pelvis January 18, 2022. FINDINGS: Despite efforts by the technologist and patient, motion artifact is present on today's exam and could not be eliminated. This reduces exam sensitivity and specificity. Lower chest: No acute abnormality. Hepatobiliary: No significant hepatic steatosis. No suspicious hepatic lesion. Gallbladder is unremarkable. No biliary ductal dilation. Pancreas: Diffuse atrophy of the pancreatic gland. 4 mm stone in the proximal pancreatic duct in the genu of the pancreas with upstream ductal dilation measuring 4 mm in maximal diameter. There are few other scattered pancreatic calcifications. No suspicious pancreatic lesion identified on this motion degraded examination. Spleen:  No splenomegaly or focal splenic lesion. Adrenals/Urinary Tract: Bilateral adrenal glands appear normal. No hydronephrosis. No solid enhancing renal mass. Well-circumscribed fluid signal exophytic left renal cyst measures 5.5 x 3.5 cm without suspicious postcontrast enhancement or wall thickening/nodularity consistent with a benign Bosniak classification 1 renal cyst requiring no independent imaging follow-up. Stomach/Bowel: Large periampullary duodenal diverticulum. No pathologic dilation or evidence of acute inflammation  involving loops of large or small bowel in the abdomen. Vascular/Lymphatic: Normal caliber abdominal aorta. The portal, splenic and superior mesenteric veins are patent. No pathologically enlarged abdominal lymph nodes Other:  No significant abdominal free fluid Musculoskeletal: Similar appearance of the acute T12 compression deformity with a proximally 40% height loss. Few scattered vertebral body hemangiomata. Multilevel degenerative changes spine. IMPRESSION: 1. Sequela of chronic pancreatitis with 4 mm stone in the proximal pancreatic duct and upstream ductal dilation measuring 4 mm. No evidence of acute pancreatitis or suspicious pancreatic lesion identified on this motion degraded examination. 2. Large periampullary duodenal diverticulum. 3. Similar appearance of the acute T12 compression deformity with a proximally 40% height loss. Electronically Signed   By: JDahlia BailiffM.D.   On: 01/18/2022 10:30   CT ABDOMEN PELVIS W CONTRAST  Result Date: 01/18/2022 CLINICAL DATA:  Nausea and vomiting. Lower abdominal pain.  Bloating. EXAM: CT ABDOMEN AND PELVIS WITH CONTRAST TECHNIQUE: Multidetector CT imaging of the abdomen and pelvis was performed using the standard protocol following bolus administration of intravenous contrast. RADIATION DOSE REDUCTION: This exam was performed according to the departmental dose-optimization program which includes automated exposure control, adjustment of the mA and/or kV according to patient size and/or use of iterative reconstruction technique. CONTRAST:  3m OMNIPAQUE IOHEXOL 300 MG/ML  SOLN COMPARISON:  CT pelvis 01/07/2022 FINDINGS: Lower chest: Mild atelectasis in the lung bases, greater on the right. Hepatobiliary: No focal liver abnormality is seen. No gallstones, gallbladder wall thickening, or biliary dilatation. Pancreas: Scattered calcifications in the pancreatic head likely to represent evidence of chronic pancreatitis. There is a stone in the body of the pancreas  likely in the duct with mild proximal pancreatic ductal dilatation. The stone measures 6 mm in diameter. The stone was present on prior CT lumbar spine from 01/07/2022. No stranding around the pancreas. Spleen: Normal in size without focal abnormality. Adrenals/Urinary Tract: No adrenal gland nodules. Cyst on the right kidney measuring 4.6 cm diameter. No follow-up imaging is indicated. No hydronephrosis or hydroureter. Bladder is normal. Stomach/Bowel: Stomach, small bowel, and colon are not abnormally distended. Stool fills the colon. No wall thickening or inflammatory changes. Appendix is not identified. Vascular/Lymphatic: Calcification of the aorta. No aneurysm. No significant lymphadenopathy. Reproductive: Status post hysterectomy. No adnexal masses. Other: No free air or free fluid in the abdomen. Abdominal wall musculature appears intact. Musculoskeletal: Degenerative changes. No acute bony abnormalities. Compression of superior endplate of TN02 No change since prior CT lumbar spine from 01/07/2022. IMPRESSION: 1. No evidence of bowel obstruction or inflammation. 2. 6 mm stone in the body of the pancreas likely in the pancreatic duct. Mild proximal pancreatic ductal dilatation. Although ductal dilatation is likely related to the stone, follow-up with elective MRI/MRCP is recommended to exclude an underlying obstructing mass lesion. 3. Calcifications in the head of the pancreas likely indicating evidence of prior chronic pancreatitis. No acute inflammatory changes. 4. Atelectasis in the lung bases. 5. Aortic atherosclerosis. Electronically Signed   By: WLucienne CapersM.D.   On: 01/18/2022 03:28   DG Abdomen Acute W/Chest  Result Date: 01/18/2022 CLINICAL DATA:  Abdominal pain, vomiting EXAM: DG ABDOMEN ACUTE WITH 1 VIEW CHEST COMPARISON:  None Available. FINDINGS: There is no evidence of dilated bowel loops or free intraperitoneal air. No radiopaque calculi or other significant radiographic abnormality  is seen. Heart size and mediastinal contours are within normal limits. Both lungs are clear. IMPRESSION: Negative abdominal radiographs.  No acute cardiopulmonary disease. Electronically Signed   By: AFidela SalisburyM.D.   On: 01/18/2022 00:08    IMPRESSION: Intractable abdominal pain, controlled Constipation, improved Proximal pancreatic duct stone (4 mm) with upstream ductal dilatation (4 mm) Findings suggestive of chronic pancreatitis Back pain, history T12 compression deformity Lactic acidosis, resolved Normocytic anemia  PLAN: -Patient appears to be clinically improved today, she is awake and alert, eating in bed with no abdominal pain -Agree with current pain regimen -Can consider pancreatic enzyme therapy as very low risk therapy and may provide some pain relief -Do not recommend biliary/pancreatic endoscopic intervention at this time given improvement in pain -Continue bowel regimen -Diet as tolerated -Gastroenterology team will respectfully sign off and be available for any questions that arise   LOS: 0 days   CDanton Clap DFreeman [facility] WestG[facility]enterology

## 2022-01-19 NOTE — Progress Notes (Signed)
   01/19/22 1403  Orthostatic Lying   BP- Lying 139/72  Pulse- Lying 68  Orthostatic Sitting  BP- Sitting 152/79  Pulse- Sitting 73  Orthostatic Standing at 0 minutes  BP- Standing at 0 minutes 117/77  Pulse- Standing at 0 minutes 72  Orthostatic Standing at 3 minutes  BP- Standing at 3 minutes 148/72  Pulse- Standing at 3 minutes 77   Despite drop in BP, patient denied dizziness, lightheadedness, and was able to transfer steadily with walker from bed to chair without assistance.  Angie Fava, RN

## 2022-01-19 NOTE — Progress Notes (Signed)
Orthopedic Tech Progress Note Patient Details:  Kristin Bryant 17-Feb-1923 [id number] Ab binder delivered to patient's room  Ortho Devices Type of Ortho Device: Abdominal binder Ortho Device/Splint Interventions: Ordered      Linus Salmons [name] 01/19/2022, 5:18 PM

## 2022-01-19 NOTE — Evaluation (Signed)
Physical Therapy Evaluation Patient Details Name: Kristin Bryant MRN: [id number] DOB: 14-Mar-1923 Today's Date: 01/19/2022  History of Present Illness  88 year old woman presenting with abdominal pain and vomiting.  CT showed 6 mm stone in the body of the pancreas, a likely in the pancreatic duct.  Admitted for further evaluation.  Seen by gastroenterology with recommendation for conservative management. Had fall a couple weeks ago and presented to ED with T12 compression fracture and Right inferior pubic rami fracture as well.  Clinical Impression  Pt wanting and willing to get up for assessment. Pt's has a lot of low back and pelvic pain with very little movement in bed and with most all transition ( supine to sit, sitting and sit to stand) . She does go slow and requires some assistance, but once up on feet, walking activity is much better and less painful. She will need assistance with bathing and dressing with this pain, and supervision initially with any mobility. Family feels AL facility will help as long as the patient will call for help. They also have another caregiver coming to stay with her at night to help patient. Recommend HHPT and HHOT to help progress patient in her own environment.        Recommendations for follow up therapy are one component of a multi-disciplinary discharge planning process, led by the attending physician.  Recommendations may be updated based on patient status, additional functional criteria and insurance authorization.  Follow Up Recommendations Home health PT      Assistance Recommended at Discharge Set up Supervision/Assistance  Patient can return home with the following  A little help with walking and/or transfers;A little help with bathing/dressing/bathroom    Equipment Recommendations None recommended by PT  Recommendations for Other Services       Functional Status Assessment Patient has had a recent decline in their functional status and  demonstrates the ability to make significant improvements in function in a reasonable and predictable amount of time.     Precautions / Restrictions Precautions Precautions: Fall Precaution Comments: history of recent fall a few weeks ago      Mobility  Bed Mobility Overal bed mobility: Needs Assistance Bed Mobility: Supine to Sit, Sit to Supine     Supine to sit: Min assist Sit to supine: Min assist   General bed mobility comments: educated pt and pt's daughter with how to get in and out of bed with least amount of pain. Hooklying log roll technique    Transfers Overall transfer level: Needs assistance Equipment used: Rolling walker (2 wheels)               General transfer comment: cues for safety with locking rollator brakes and time due to pain in low back area    Ambulation/Gait Ambulation/Gait assistance: Supervision Gait Distance (Feet): 300 Feet Assistive device: Rolling walker (2 wheels) Gait Pattern/deviations: Step-through pattern       General Gait Details: once up and walkign pt had less pain ( still a little) but less. had to take 3 standign rest breaks during long walk. Has to walk to elevator and to dining hall several hundred feet .  Stairs            Wheelchair Mobility    Modified Rankin (Stroke Patients Only)       Balance Overall balance assessment: No apparent balance deficits (not formally assessed)  Pertinent Vitals/Pain Pain Assessment Pain Assessment: 0-10 Pain Score: 5  Pain Location: low back and L and hip posterior hip area Pain Descriptors / Indicators: Aching, Sharp, Shooting Pain Intervention(s): Limited activity within patient's tolerance, Monitored during session    Home Living Family/patient expects to be discharged to:: Assisted living [facility])                        Prior Function Prior Level of Function : Needs assist                ADLs Comments: since fall 2 weeks ago has needed assist with all mobility and ADLs     Hand Dominance        Extremity/Trunk Assessment        Lower Extremity Assessment Lower Extremity Assessment: Overall WFL for tasks assessed       Communication      Cognition Arousal/Alertness: Awake/alert Behavior During Therapy: WFL for tasks assessed/performed Overall Cognitive Status: Within Functional Limits for tasks assessed                                          General Comments      Exercises     Assessment/Plan    PT Assessment Patient needs continued PT services  PT Problem List Decreased strength;Decreased range of motion;Decreased activity tolerance;Decreased mobility       PT Treatment Interventions DME instruction;Functional mobility training;Therapeutic activities;Therapeutic exercise;Patient/family education    PT Goals (Current goals can be found in the Care Plan section)  Acute Rehab PT Goals Patient Stated Goal: I want to get up and move PT Goal Formulation: With patient Time For Goal Achievement: 02/02/22 Potential to Achieve Goals: Good    Frequency Min 3X/week     Co-evaluation               AM-PAC PT "6 Clicks" Mobility  Outcome Measure Help needed turning from your back to your side while in a flat bed without using bedrails?: A Little Help needed moving from lying on your back to sitting on the side of a flat bed without using bedrails?: A Little Help needed moving to and from a bed to a chair (including a wheelchair)?: A Little Help needed standing up from a chair using your arms (e.g., wheelchair or bedside chair)?: A Little Help needed to walk in hospital room?: A Little Help needed climbing 3-5 steps with a railing? : A Little 6 Click Score: 18    End of Session Equipment Utilized During Treatment: Gait belt Activity Tolerance: Patient tolerated treatment well Patient left: in bed;with  family/visitor present Nurse Communication: Mobility status PT Visit Diagnosis: Other abnormalities of gait and mobility (R26.89);History of falling (Z91.81)    Time: 1600-1630 PT Time Calculation (min) (ACUTE ONLY): 30 min   Charges:   PT Evaluation $PT Eval Low Complexity: 1 Low PT Treatments $Gait Training: 8-22 mins        Gatha Mayer, PT, MPT Acute Rehabilitation Services Office: [phone] If a weekend: [facility] w/e pager [phone] 01/19/2022   Clide Dales 01/19/2022, 4:55 PM

## 2022-01-19 NOTE — Progress Notes (Signed)
  Progress Note   Patient: Kristin Bryant YQM:[id number] DOB: [birth year] DOA: 01/17/2022     0 DOS: the patient was seen and examined on 01/19/2022   Brief hospital course: 88 year old woman presenting with abdominal pain and vomiting.  CT showed 6 mm stone in the body of the pancreas, a likely in the pancreatic duct.  Admitted for further evaluation.  Seen by gastroenterology with recommendation for conservative management, no oral for pancreatic enzyme supplementation at this time.  Main treatment is pain control.  If stone extraction were desired, she would have to be referred to a tertiary care center.  Not recommended by GI and family does not desire at this point.  Assessment and Plan: Abdominal pain, vomiting, chronic pancreatitis --MRCP showed sequela of chronic pancreatitis, 4 mm stone in proximal pancreatic duct with proximal dilatation.  No acute pancreatitis or suspicious pancreatic lesion. --Seen by GI, no endoscopic intervention recommended given age and high risk for injury of the duct or leak.  Recommended pain control and today recommended pancreatic enzymes   Recurrent falls prior to admission, possible syncope --Suspect orthostasis based on history.  Stopped Lotensin. --orthostatics noted, positive when sitting. PT consult recommended HH and abd binder.   UTI diagnosed prior to admission, being treated with Keflex as an outpatient --Complete treatment.   Essential hypertension --With history suggestive of orthostasis and given advanced age and multiple falls, stop Lotensin.   Hypothyroidism --Continue levothyroxine.   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 --known prior to admission. Pain control, responds well to Tylenol per son.  Scheduled Tylenol.       Subjective:  Has back pain that radiates around to flanks Did eat earlier  Physical Exam: Vitals:   01/18/22 2159 01/19/22 0419 01/19/22 1209 01/19/22  1655  BP: 139/70 138/62 138/66 139/72  Pulse: 77 65 69 68  Resp: '18 18 18   '$ Temp: 99.1 F (37.3 C) 98.2 F (36.8 C) 98 F (36.7 C)   TempSrc: Oral Oral    SpO2: 94% 94% 97%   Weight:      Height:       Physical Exam Vitals reviewed.  Constitutional:      General: She is not in acute distress.    Appearance: She is not ill-appearing or toxic-appearing.  Cardiovascular:     Rate and Rhythm: Normal rate and regular rhythm.     Heart sounds: No murmur heard.    Comments: Telemetry SR Pulmonary:     Effort: Pulmonary effort is normal. No respiratory distress.     Breath sounds: No wheezing or rhonchi.  Abdominal:     General: There is no distension.     Palpations: Abdomen is soft.     Tenderness: There is no abdominal tenderness.  Neurological:     Mental Status: She is alert.  Psychiatric:        Mood and Affect: Mood normal.        Behavior: Behavior normal.     Data Reviewed:  No new data  Family Communication: son and daughter at bedside  Disposition: Status is: Inpatient Remains inpatient appropriate because: abd pain  Planned Discharge Destination: Home with Home Health    Time spent: 25 minutes  Author: Murray Hodgkins, MD 01/19/2022 6:10 PM  For on call review [email or web address].

## 2022-01-20 MED ORDER — PANCRELIPASE (LIP-PROT-AMYL) 24000-76000 UNITS PO CPEP: 24000.0000 [IU] | ORAL_CAPSULE | Freq: Three times a day (TID) | ORAL | 2 refills | Status: AC

## 2022-01-20 MED ORDER — POLYETHYLENE GLYCOL 3350 17 GM/SCOOP PO POWD: 17.0000 g | Freq: Two times a day (BID) | ORAL | Status: AC

## 2022-01-20 NOTE — TOC Transition Note (Addendum)
Transition of Care [facility]) - CM/SW Discharge Note   Patient Details  Name: Kristin Bryant MRN: [id number] Date of Birth: 18-Jan-1923  [facility]) CM/SW Contact:  Henrietta Dine, RN Phone Number: 01/20/2022, 12:58 PM   Clinical Narrative:    D/C orders received; pt from [place] [facility] ; D/C summary and FL2 faxed to Cherlyn Cushing, Executive Director [phone]); confirmation received; HHPT and [place] accepted by [facility]; follow up provider added to d/c instructions; pt's son Tyerra L[place] notified and will transport pt to facility; no TOC needs.    Final next level of care: Assisted Living Barriers to Discharge: No Barriers Identified   Patient Goals and CMS Choice Patient states their goals for this hospitalization and ongoing recovery are:: [place] [facility]      Discharge Placement                Patient to be transferred to facility by: [name] (son) Name of family member notified: [name] Patient and family notified of of transfer: 01/20/22  Discharge Plan and Services                          HH Arranged: PT, OT [facility] Agency: [place] Date [place]: 01/20/22 Time HH Agency Contacted: [phone]    Social Determinants of Health (SDOH) Interventions     Readmission Risk Interventions     No data to display

## 2022-01-20 NOTE — Progress Notes (Signed)
Discharge instructions provided to patient, and patient's son and daughter-in-law.  All verbalized understanding.  Patient's jewelry and other belongings taken with patient via wheelchair to main entrance for transport to [place] at [place] by son.  PIV removed.  RN attempted to give report to staff at [facility] but voicemail unavailable.  Angie Fava, RN

## 2022-01-20 NOTE — Progress Notes (Signed)
Mobility Specialist - Progress Note   01/20/22 1200  Mobility  Activity Ambulated with assistance in hallway  Level of Assistance Contact guard assist, steadying assist  Assistive Device Other (Comment) (rollator)  Distance Ambulated (ft) 200 ft  Range of Motion/Exercises Active  Activity Response Tolerated well  Mobility Referral Yes  $Mobility charge 1 Mobility   Pt was found in bed and agreeable to ambulate. Became fatigued during session and once returning to room and was supine on bed experienced back pain. At EOS returned to bed with all necessities in reach and NT notified Pt was in bed.  Ferd Hibbs Mobility Specialist

## 2022-01-20 NOTE — TOC Progression Note (Addendum)
Transition of Care [facility]) - Progression Note    Patient Details  Name: Kristin Bryant MRN: [id number] Date of Birth: Apr 27, 1922  [facility]) CM/SW Contact  Henrietta Dine, RN Phone Number: 01/20/2022, 11:17 AM  Clinical Narrative:    Pt from [place] [facility] [place]; expected d/c today; orders for HHPT and [place] also written; spoke with Lynelle Doctor at [facility] and she says there is no Systems analyst available to accept pt; [address] says that she will back to discuss re-admitting pt; gave cal back # [phone]; will also contact agency for [place].  -[phone] - contacted by Lynelle Doctor from [facility]; she says the pt can be approved by Frederik Schmidt, Development worker, international aid; she requested the [facility] be faxed to [phone]; she also provided email [email or web address]  [place].  -[id number] and D/C summary faxed to  [phone]   -[phone]- [place] and [place] arranged with Alvis Lemmings; contacted [facility]    Expected Discharge Plan and Services                                                 Social Determinants of Health (SDOH) Interventions    Readmission Risk Interventions     No data to display

## 2022-01-20 NOTE — Discharge Summary (Signed)
Physician Discharge Summary   Patient: Kristin Bryant MRN: [id number] DOB: [birth year]  Admit date:     01/17/2022  Discharge date: 01/20/22  Discharge Physician: Murray Hodgkins   PCP: Earmon Phoenix, NP   Recommendations at discharge:   Abdominal pain, vomiting, chronic pancreatitis --MRCP showed sequela of chronic pancreatitis, 4 mm stone in proximal pancreatic duct with proximal dilatation.  No acute pancreatitis or suspicious pancreatic lesion. --Seen by GI, no endoscopic intervention recommended given age and high risk for injury of the duct or leak.  Recommended pain control and pancreatic enzymes -Tolerating diet.  No abdominal pain.-   Recurrent falls prior to admission, possible syncope PTA, most likely secondary to orthostatic hypotension --Suspect orthostasis based on history.  Stopped Lotensin. --orthostatics noted, positive when sitting. PT consult recommended HH and abd binder.  Use abdominal binder   Essential hypertension --With history suggestive of orthostasis and given advanced age and multiple falls, stop Lotensin.  Discharge Diagnoses: Principal Problem:   Abdominal pain Active Problems:   HTN (hypertension)   HLD (hyperlipidemia)   Hypothyroidism   Chronic pancreatitis (HCC)   Vomiting   Fall at home, initial encounter   Thoracic compression fracture [facility])  Resolved Problems:   * No resolved hospital problems. *  Hospital Course: 88 year old woman presenting with abdominal pain and vomiting.  CT showed 6 mm stone in the body of the pancreas, a likely in the pancreatic duct.  Admitted for further evaluation.  Seen by gastroenterology with recommendation for conservative management.  Main treatment is pain control. Pancreatic enzymes added.  If stone extraction were desired, she would have to be referred to a tertiary care center.  Not recommended by GI and family does not desire at this point.  Seen by PT with recommendation for home health.  Abdominal pain,  vomiting, chronic pancreatitis --MRCP showed sequela of chronic pancreatitis, 4 mm stone in proximal pancreatic duct with proximal dilatation.  No acute pancreatitis or suspicious pancreatic lesion. --Seen by GI, no endoscopic intervention recommended given age and high risk for injury of the duct or leak.  Recommended pain control and pancreatic enzymes -Tolerating diet.  No abdominal pain.-   Recurrent falls prior to admission, possible syncope PTA, most likely secondary to orthostatic hypotension --Suspect orthostasis based on history.  Stopped Lotensin. --orthostatics noted, positive when sitting. PT consult recommended HH and abd binder.   UTI diagnosed prior to admission, being treated with Keflex as an outpatient --Completed treatment.   Essential hypertension --With history suggestive of orthostasis and given advanced age and multiple falls, stop Lotensin.   Hypothyroidism --Continue levothyroxine.   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 --known prior to admission. Pain control, responds well to Tylenol per son.  Scheduled Tylenol.     Consultants:  GI  Procedures performed:  None   Disposition: Assisted living Diet recommendation:  Regular diet DISCHARGE MEDICATION: Allergies as of 01/20/2022       Reactions   Aspirin    Benzonatate Nausea Only   Other reaction(s): nausea   Celexa [citalopram]    Chlorzoxazone    Citalopram Hydrobromide    Cortisone    Other reaction(s): infection   Diclofenac-misoprostol    Other reaction(s): Unknown   Escitalopram Oxalate    Other reaction(s): Unknown   Guaifenesin    Other reaction(s): Unknown   Guaifenesin & Derivatives Nausea Only   Hydrocodone    Other reaction(s): Unknown   Ipratropium Bromide  Other reaction(s): nausea   L[employer] [place] Herb [convallariae Majalis]    Mirtazapine    Passing out Other reaction(s): passed out?   Naproxen    Other  reaction(s): Unknown   Neosporin [bacitracin-polymyxin B]    Other reaction(s): Unknown   Prednisone    Other reaction(s): Unknown   Pseudoephedrine    Other reaction(s): Unknown   Ranitidine Nausea Only, Other (See Comments)   Stomach upset   Ranitidine Hcl    Other reaction(s): stomach upset, nausea   Sudafed [pseudoephedrine Hcl]    Sulfa Antibiotics    Other reaction(s): Unknown   Epinephrine Palpitations   Rapid heart rate   Latex Rash        Medication List     STOP taking these medications    benazepril 20 MG tablet Commonly known as: LOTENSIN   cephALEXin 500 MG capsule Commonly known as: KEFLEX   diphenhydrAMINE 25 MG tablet Commonly known as: BENADRYL   HYDROcodone-acetaminophen 5-325 MG tablet Commonly known as: Norco   ondansetron 4 MG disintegrating tablet Commonly known as: ZOFRAN-ODT       TAKE these medications    acetaminophen 500 MG tablet Commonly known as: TYLENOL Take 1,000 mg by mouth every 8 (eight) hours.   docusate sodium 100 MG capsule Commonly known as: COLACE Take 1 capsule (100 mg total) by mouth every 12 (twelve) hours. What changed: when to take this   Dulcolax 5 MG EC tablet Generic drug: bisacodyl Take 1 tablet (5 mg total) by mouth daily as needed for moderate constipation (if no bowel movement with miralax and stool softner). What changed: reasons to take this   latanoprost 0.005 % ophthalmic solution Commonly known as: XALATAN Place 1 drop into both eyes at bedtime.   levothyroxine 88 MCG tablet Commonly known as: SYNTHROID Take 88 mcg by mouth daily.   lidocaine 4 % Place 2 patches onto the skin in the morning. To right ankle, lower back.   Pancrelipase (Lip-Prot-Amyl) 24000-76000 units Cpep Take 1 capsule (24,000 Units total) by mouth 3 (three) times daily before meals.   polyethylene glycol powder 17 GM/SCOOP powder Commonly known as: GLYCOLAX/MIRALAX Take 17 g by mouth in the morning and at bedtime.    PRESERVISION AREDS 2 PO Take 1 capsule by mouth daily.   QUEtiapine 25 MG tablet Commonly known as: SEROQUEL Take 25 mg by mouth at bedtime.   sertraline 25 MG tablet Commonly known as: ZOLOFT Take 25 mg by mouth daily.   timolol 0.5 % ophthalmic solution Commonly known as: TIMOPTIC Place 1 drop into both eyes 2 (two) times daily.   traMADol 50 MG tablet Commonly known as: ULTRAM Take 25 mg by mouth See admin instructions. 2 Entries on MAR :  25 mg every morning + 25 mg every 8 hours as needed for pain   Vitamin D-3 125 MCG (5000 UT) Tabs Take 5,000 Units by mouth daily.        Follow-up Information     Earmon Phoenix, NP Follow up.   Specialty: Gerontology Why: As needed Contact information: [place] [address] [phone]                Feels better Tolerated breakfast, no abdominal pain Moved better today  Discharge Exam: Filed Weights   01/17/22 2338 01/18/22 0702  Weight: 56.7 kg 64.1 kg   Physical Exam Vitals reviewed.  Constitutional:      General: She is not in acute distress.  Appearance: She is not ill-appearing or toxic-appearing.  Cardiovascular:     Rate and Rhythm: Normal rate and regular rhythm.     Heart sounds: No murmur heard. Pulmonary:     Effort: Pulmonary effort is normal. No respiratory distress.     Breath sounds: No wheezing, rhonchi or rales.  Neurological:     Mental Status: She is alert.  Psychiatric:        Mood and Affect: Mood normal.        Behavior: Behavior normal.      Condition at discharge: good  The results of significant diagnostics from this hospitalization (including imaging, microbiology, ancillary and laboratory) are listed below for reference.   Imaging Studies: MR ABDOMEN MRCP W WO CONTAST  Result Date: 01/18/2022 CLINICAL DATA:  Pancreatitis suspected, pancreatic duct stone with dilation of the proximal duct. EXAM: MRI ABDOMEN WITHOUT AND WITH CONTRAST (INCLUDING  MRCP) TECHNIQUE: Multiplanar multisequence MR imaging of the abdomen was performed both before and after the administration of intravenous contrast. Heavily T2-weighted images of the biliary and pancreatic ducts were obtained, and three-dimensional MRCP images were rendered by post processing. CONTRAST:  74m GADAVIST GADOBUTROL 1 MMOL/ML IV SOLN COMPARISON:  CT abdomen pelvis January 18, 2022. FINDINGS: Despite efforts by the technologist and patient, motion artifact is present on today's exam and could not be eliminated. This reduces exam sensitivity and specificity. Lower chest: No acute abnormality. Hepatobiliary: No significant hepatic steatosis. No suspicious hepatic lesion. Gallbladder is unremarkable. No biliary ductal dilation. Pancreas: Diffuse atrophy of the pancreatic gland. 4 mm stone in the proximal pancreatic duct in the genu of the pancreas with upstream ductal dilation measuring 4 mm in maximal diameter. There are few other scattered pancreatic calcifications. No suspicious pancreatic lesion identified on this motion degraded examination. Spleen:  No splenomegaly or focal splenic lesion. Adrenals/Urinary Tract: Bilateral adrenal glands appear normal. No hydronephrosis. No solid enhancing renal mass. Well-circumscribed fluid signal exophytic left renal cyst measures 5.5 x 3.5 cm without suspicious postcontrast enhancement or wall thickening/nodularity consistent with a benign Bosniak classification 1 renal cyst requiring no independent imaging follow-up. Stomach/Bowel: Large periampullary duodenal diverticulum. No pathologic dilation or evidence of acute inflammation involving loops of large or small bowel in the abdomen. Vascular/Lymphatic: Normal caliber abdominal aorta. The portal, splenic and superior mesenteric veins are patent. No pathologically enlarged abdominal lymph nodes Other:  No significant abdominal free fluid Musculoskeletal: Similar appearance of the acute T12 compression deformity  with a proximally 40% height loss. Few scattered vertebral body hemangiomata. Multilevel degenerative changes spine. IMPRESSION: 1. Sequela of chronic pancreatitis with 4 mm stone in the proximal pancreatic duct and upstream ductal dilation measuring 4 mm. No evidence of acute pancreatitis or suspicious pancreatic lesion identified on this motion degraded examination. 2. Large periampullary duodenal diverticulum. 3. Similar appearance of the acute T12 compression deformity with a proximally 40% height loss. Electronically Signed   By: JDahlia BailiffM.D.   On: 01/18/2022 10:30   MR 3D Recon At Scanner  Result Date: 01/18/2022 CLINICAL DATA:  Pancreatitis suspected, pancreatic duct stone with dilation of the proximal duct. EXAM: MRI ABDOMEN WITHOUT AND WITH CONTRAST (INCLUDING MRCP) TECHNIQUE: Multiplanar multisequence MR imaging of the abdomen was performed both before and after the administration of intravenous contrast. Heavily T2-weighted images of the biliary and pancreatic ducts were obtained, and three-dimensional MRCP images were rendered by post processing. CONTRAST:  615mGADAVIST GADOBUTROL 1 MMOL/ML IV SOLN COMPARISON:  CT abdomen pelvis January 18, 2022. FINDINGS: Despite  efforts by the technologist and patient, motion artifact is present on today's exam and could not be eliminated. This reduces exam sensitivity and specificity. Lower chest: No acute abnormality. Hepatobiliary: No significant hepatic steatosis. No suspicious hepatic lesion. Gallbladder is unremarkable. No biliary ductal dilation. Pancreas: Diffuse atrophy of the pancreatic gland. 4 mm stone in the proximal pancreatic duct in the genu of the pancreas with upstream ductal dilation measuring 4 mm in maximal diameter. There are few other scattered pancreatic calcifications. No suspicious pancreatic lesion identified on this motion degraded examination. Spleen:  No splenomegaly or focal splenic lesion. Adrenals/Urinary Tract: Bilateral  adrenal glands appear normal. No hydronephrosis. No solid enhancing renal mass. Well-circumscribed fluid signal exophytic left renal cyst measures 5.5 x 3.5 cm without suspicious postcontrast enhancement or wall thickening/nodularity consistent with a benign Bosniak classification 1 renal cyst requiring no independent imaging follow-up. Stomach/Bowel: Large periampullary duodenal diverticulum. No pathologic dilation or evidence of acute inflammation involving loops of large or small bowel in the abdomen. Vascular/Lymphatic: Normal caliber abdominal aorta. The portal, splenic and superior mesenteric veins are patent. No pathologically enlarged abdominal lymph nodes Other:  No significant abdominal free fluid Musculoskeletal: Similar appearance of the acute T12 compression deformity with a proximally 40% height loss. Few scattered vertebral body hemangiomata. Multilevel degenerative changes spine. IMPRESSION: 1. Sequela of chronic pancreatitis with 4 mm stone in the proximal pancreatic duct and upstream ductal dilation measuring 4 mm. No evidence of acute pancreatitis or suspicious pancreatic lesion identified on this motion degraded examination. 2. Large periampullary duodenal diverticulum. 3. Similar appearance of the acute T12 compression deformity with a proximally 40% height loss. Electronically Signed   By: Dahlia Bailiff M.D.   On: 01/18/2022 10:30   CT ABDOMEN PELVIS W CONTRAST  Result Date: 01/18/2022 CLINICAL DATA:  Nausea and vomiting. Lower abdominal pain. Bloating. EXAM: CT ABDOMEN AND PELVIS WITH CONTRAST TECHNIQUE: Multidetector CT imaging of the abdomen and pelvis was performed using the standard protocol following bolus administration of intravenous contrast. RADIATION DOSE REDUCTION: This exam was performed according to the departmental dose-optimization program which includes automated exposure control, adjustment of the mA and/or kV according to patient size and/or use of iterative  reconstruction technique. CONTRAST:  49m OMNIPAQUE IOHEXOL 300 MG/ML  SOLN COMPARISON:  CT pelvis 01/07/2022 FINDINGS: Lower chest: Mild atelectasis in the lung bases, greater on the right. Hepatobiliary: No focal liver abnormality is seen. No gallstones, gallbladder wall thickening, or biliary dilatation. Pancreas: Scattered calcifications in the pancreatic head likely to represent evidence of chronic pancreatitis. There is a stone in the body of the pancreas likely in the duct with mild proximal pancreatic ductal dilatation. The stone measures 6 mm in diameter. The stone was present on prior CT lumbar spine from 01/07/2022. No stranding around the pancreas. Spleen: Normal in size without focal abnormality. Adrenals/Urinary Tract: No adrenal gland nodules. Cyst on the right kidney measuring 4.6 cm diameter. No follow-up imaging is indicated. No hydronephrosis or hydroureter. Bladder is normal. Stomach/Bowel: Stomach, small bowel, and colon are not abnormally distended. Stool fills the colon. No wall thickening or inflammatory changes. Appendix is not identified. Vascular/Lymphatic: Calcification of the aorta. No aneurysm. No significant lymphadenopathy. Reproductive: Status post hysterectomy. No adnexal masses. Other: No free air or free fluid in the abdomen. Abdominal wall musculature appears intact. Musculoskeletal: Degenerative changes. No acute bony abnormalities. Compression of superior endplate of TW25 No change since prior CT lumbar spine from 01/07/2022. IMPRESSION: 1. No evidence of bowel obstruction or inflammation. 2. 6  mm stone in the body of the pancreas likely in the pancreatic duct. Mild proximal pancreatic ductal dilatation. Although ductal dilatation is likely related to the stone, follow-up with elective MRI/MRCP is recommended to exclude an underlying obstructing mass lesion. 3. Calcifications in the head of the pancreas likely indicating evidence of prior chronic pancreatitis. No acute  inflammatory changes. 4. Atelectasis in the lung bases. 5. Aortic atherosclerosis. Electronically Signed   By: Lucienne Capers M.D.   On: 01/18/2022 03:28   DG Abdomen Acute W/Chest  Result Date: 01/18/2022 CLINICAL DATA:  Abdominal pain, vomiting EXAM: DG ABDOMEN ACUTE WITH 1 VIEW CHEST COMPARISON:  None Available. FINDINGS: There is no evidence of dilated bowel loops or free intraperitoneal air. No radiopaque calculi or other significant radiographic abnormality is seen. Heart size and mediastinal contours are within normal limits. Both lungs are clear. IMPRESSION: Negative abdominal radiographs.  No acute cardiopulmonary disease. Electronically Signed   By: Fidela Salisbury M.D.   On: 01/18/2022 00:08   CT Head Wo Contrast  Result Date: 01/12/2022 CLINICAL DATA:  Trauma EXAM: CT HEAD WITHOUT CONTRAST CT CERVICAL SPINE WITHOUT CONTRAST TECHNIQUE: Multidetector CT imaging of the head and cervical spine was performed following the standard protocol without intravenous contrast. Multiplanar CT image reconstructions of the cervical spine were also generated. RADIATION DOSE REDUCTION: This exam was performed according to the departmental dose-optimization program which includes automated exposure control, adjustment of the mA and/or kV according to patient size and/or use of iterative reconstruction technique. COMPARISON:  None Available. FINDINGS: CT HEAD FINDINGS Brain: No evidence of acute infarction, hemorrhage, hydrocephalus, extra-axial collection or mass lesion/mass effect. There is advanced generalized volume loss Vascular: No hyperdense vessel or unexpected calcification. Skull: Normal. Negative for fracture or focal lesion. Sinuses/Orbits: Bilateral lens replacements. Mild thickening in the bilateral ethmoid, right sphenoid, bilateral maxillary sinuses. Other: None. CT CERVICAL SPINE FINDINGS Alignment: Straightening of the normal cervical lordosis. Skull base and vertebrae: There are reactive  degenerative endplate changes at C4, C5, and C6. no evidence of fracture. Soft tissues and spinal canal: No prevertebral fluid or swelling. No visible canal hematoma. Disc levels: There is a degenerative pannus at the C1-C2 level. There is severe spinal canal stenosis at C4-C5 unlikely C5-C6 secondary to a circumferential disc bulge. Upper chest: Negative. Other: Atherosclerotic vascular calcifications. IMPRESSION: 1.  No CT evidence of intracranial injury. 2.  No evidence of cervical spine fracture. 3. Severe spinal canal stenosis is noted at C4-C5 and C5-C6. If there is clinical concern for myelopathic symptoms, further evaluation with a cervical spine MRI could be considered. Electronically Signed   By: Marin Roberts M.D.   On: 01/12/2022 08:44   CT Cervical Spine Wo Contrast  Result Date: 01/12/2022 CLINICAL DATA:  Trauma EXAM: CT HEAD WITHOUT CONTRAST CT CERVICAL SPINE WITHOUT CONTRAST TECHNIQUE: Multidetector CT imaging of the head and cervical spine was performed following the standard protocol without intravenous contrast. Multiplanar CT image reconstructions of the cervical spine were also generated. RADIATION DOSE REDUCTION: This exam was performed according to the departmental dose-optimization program which includes automated exposure control, adjustment of the mA and/or kV according to patient size and/or use of iterative reconstruction technique. COMPARISON:  None Available. FINDINGS: CT HEAD FINDINGS Brain: No evidence of acute infarction, hemorrhage, hydrocephalus, extra-axial collection or mass lesion/mass effect. There is advanced generalized volume loss Vascular: No hyperdense vessel or unexpected calcification. Skull: Normal. Negative for fracture or focal lesion. Sinuses/Orbits: Bilateral lens replacements. Mild thickening in the bilateral ethmoid, right  sphenoid, bilateral maxillary sinuses. Other: None. CT CERVICAL SPINE FINDINGS Alignment: Straightening of the normal cervical lordosis.  Skull base and vertebrae: There are reactive degenerative endplate changes at C4, C5, and C6. no evidence of fracture. Soft tissues and spinal canal: No prevertebral fluid or swelling. No visible canal hematoma. Disc levels: There is a degenerative pannus at the C1-C2 level. There is severe spinal canal stenosis at C4-C5 unlikely C5-C6 secondary to a circumferential disc bulge. Upper chest: Negative. Other: Atherosclerotic vascular calcifications. IMPRESSION: 1.  No CT evidence of intracranial injury. 2.  No evidence of cervical spine fracture. 3. Severe spinal canal stenosis is noted at C4-C5 and C5-C6. If there is clinical concern for myelopathic symptoms, further evaluation with a cervical spine MRI could be considered. Electronically Signed   By: Marin Roberts M.D.   On: 01/12/2022 08:44   DG Hip Unilat W or Wo Pelvis 2-3 Views Right  Result Date: 01/12/2022 CLINICAL DATA:  Fall EXAM: DG HIP (WITH OR WITHOUT PELVIS) 2-3V RIGHT COMPARISON:  CT pelvis 01/07/2022, hip radiographs 03/10/2021 FINDINGS: There is no acute fracture or dislocation. Femoroacetabular alignment of the symphysis pubis and SI joints are intact. There is a remote fracture of the right inferior pubic ramus. The soft tissues are unremarkable. IMPRESSION: No acute fracture or dislocation. Electronically Signed   By: Valetta Mole M.D.   On: 01/12/2022 08:42   DG Thoracic Spine 2 View  Result Date: 01/12/2022 CLINICAL DATA:  Fall, back pain.  Known T12 compression fracture EXAM: THORACIC SPINE 2 VIEWS COMPARISON:  Lumbar CT 01/07/2022 FINDINGS: Mild superior endplate compression fracture at T12, not appreciably changed from recent CT. Remaining thoracic vertebral body heights are maintained. No additional fractures are seen. Alignment is normal. Multilevel mild degenerative disc disease. Aortic atherosclerosis. IMPRESSION: Mild superior endplate compression fracture at T12, not appreciably changed from recent CT. No additional fractures.  Electronically Signed   By: Davina Poke D.O.   On: 01/12/2022 08:36   CT Lumbar Spine Wo Contrast  Result Date: 01/07/2022 CLINICAL DATA:  Fall 2 days ago low back pain and right hip pain EXAM: CT LUMBAR SPINE WITHOUT CONTRAST TECHNIQUE: Multidetector CT imaging of the lumbar spine was performed without intravenous contrast administration. Multiplanar CT image reconstructions were also generated. RADIATION DOSE REDUCTION: This exam was performed according to the departmental dose-optimization program which includes automated exposure control, adjustment of the mA and/or kV according to patient size and/or use of iterative reconstruction technique. COMPARISON:  No prior CT. FINDINGS: Segmentation: 5 lumbar type vertebrae. Alignment: Levocurvature of the upper lumbar spine with compensatory dextrocurvature lower lumbar spine. Straightening of the normal lumbar lordosis. 3 mm anterolisthesis of L4 on L5. 5 mm left lateral listhesis of L3 on L4 and 5 mm right lateral listhesis of L4 on L5. Vertebrae: Compression deformity of T12, which is concerning for an acute fracture with approximately 40% vertebral body height loss anteriorly. 2 mm retropulsion of the posterosuperior cortex, somewhat eccentric to the left. No evidence of extension into the pedicles or posterior elements. Vertebral body heights are otherwise preserved. Endplate degenerative changes L2-L3, eccentric to the right, and at L4-L5 and L5-S1, eccentric to the left. Paraspinal and other soft tissues: Aortic atherosclerosis. Fluid density mass extending from the superior left kidney, favored to represent a renal cyst. No lymphadenopathy. Disc levels: T11-T12: No significant disc bulge. Mild facet arthropathy. No spinal canal stenosis or neural foraminal narrowing. T12-L1: No significant disc bulge. No spinal canal stenosis or neural foraminal narrowing. L1-L2:  No significant disc bulge. No spinal canal stenosis or neural foraminal narrowing. L2-L3:  Disc height loss and moderate broad-based disc bulge. Mild-to-moderate facet arthropathy. Ligamentum flavum hypertrophy. Moderate spinal canal stenosis. Moderate right and mild left neural foraminal narrowing. L3-L4: Mild disc bulge. Mild-to-moderate facet arthropathy. Ligamentum flavum hypertrophy. Mild-to-moderate spinal canal stenosis. Mild bilateral neural foraminal narrowing. L4-L5: Prior laminectomy. Disc height loss and mild disc bulge. No spinal canal stenosis. Mild bilateral neural foraminal narrowing. L5-S1: Disc height loss and mild disc bulge. Moderate facet arthropathy. No spinal canal stenosis. Moderate left and mild right neural foraminal narrowing. IMPRESSION: 1. Compression deformity of T12, favored to be an acute fracture, with approximately 40% vertebral body height loss anteriorly. 2 mm retropulsion of the posterosuperior cortex, somewhat eccentric to the left, but no significant spinal canal stenosis. This could be further evaluated with MRI if clinically indicated. 2. L2-L3 moderate spinal canal stenosis with moderate right and mild left neural foraminal narrowing. 3. L3-L4 mild-to-moderate spinal canal stenosis and mild bilateral neural foraminal narrowing. 4. L5-S1 moderate left and mild right neural foraminal narrowing. 5. L4-L5 mild bilateral neural foraminal narrowing. Electronically Signed   By: Merilyn Baba M.D.   On: 01/07/2022 23:41   CT PELVIS WO CONTRAST  Result Date: 01/07/2022 CLINICAL DATA:  Fall 2 days ago, right hip pain EXAM: CT PELVIS WITHOUT CONTRAST TECHNIQUE: Multidetector CT imaging of the pelvis was performed following the standard protocol without intravenous contrast. RADIATION DOSE REDUCTION: This exam was performed according to the departmental dose-optimization program which includes automated exposure control, adjustment of the mA and/or kV according to patient size and/or use of iterative reconstruction technique. COMPARISON:  Right hip radiographs dated  03/10/2021 FINDINGS: Urinary Tract:  Bladder is underdistended but grossly unremarkable. Bowel:  Visualized bowel is grossly unremarkable. Vascular/Lymphatic: No evidence of aneurysm. Atherosclerotic calcifications. No suspicious pelvic lymphadenopathy. Reproductive:  Status post hysterectomy. No adnexal masses. Other:  No pelvic ascites. Musculoskeletal: No fracture is seen. Specifically, the right hip is intact. Mild degenerative changes of the lower lumbar spine. Old right inferior pubic ramus fracture deformity. IMPRESSION: No fracture is seen. Specifically, the right hip is intact. Electronically Signed   By: Julian Hy M.D.   On: 01/07/2022 23:34    Microbiology: Results for orders placed or performed during the hospital encounter of 04/19/21  Resp Panel by RT-PCR (Flu A&B, Covid) Nasopharyngeal Swab     Status: None   Collection Time: 04/19/21  3:58 PM   Specimen: Nasopharyngeal Swab; Nasopharyngeal(NP) swabs in vial transport medium  Result Value Ref Range Status   SARS Coronavirus 2 by RT PCR NEGATIVE NEGATIVE Final    Comment: (NOTE) SARS-CoV-2 target nucleic acids are NOT DETECTED.  The SARS-CoV-2 RNA is generally detectable in upper respiratory specimens during the acute phase of infection. The lowest concentration of SARS-CoV-2 viral copies this assay can detect is 138 copies/mL. A negative result does not preclude SARS-Cov-2 infection and should not be used as the sole basis for treatment or other patient management decisions. A negative result may occur with  improper specimen collection/handling, submission of specimen other than nasopharyngeal swab, presence of viral mutation(s) within the areas targeted by this assay, and inadequate number of viral copies(<138 copies/mL). A negative result must be combined with clinical observations, patient history, and epidemiological information. The expected result is Negative.  Fact Sheet for Patients:   EntrepreneurPulse.com.au  Fact Sheet for Healthcare Providers:  IncredibleEmployment.be  This test is no t yet approved or cleared by the [place]  States FDA and  has been authorized for detection and/or diagnosis of SARS-CoV-2 by FDA under an Emergency Use Authorization (EUA). This EUA will remain  in effect (meaning this test can be used) for the duration of the COVID-19 declaration under Section 564(b)(1) of the Act, 21 U.S.C.section 360bbb-3(b)(1), unless the authorization is terminated  or revoked sooner.       Influenza A by PCR NEGATIVE NEGATIVE Final   Influenza B by PCR NEGATIVE NEGATIVE Final    Comment: (NOTE) The Xpert Xpress SARS-CoV-2/FLU/RSV plus assay is intended as an aid in the diagnosis of influenza from Nasopharyngeal swab specimens and should not be used as a sole basis for treatment. Nasal washings and aspirates are unacceptable for Xpert Xpress SARS-CoV-2/FLU/RSV testing.  Fact Sheet for Patients: EntrepreneurPulse.com.au  Fact Sheet for Healthcare Providers: IncredibleEmployment.be  This test is not yet approved or cleared by the [place] FDA and has been authorized for detection and/or diagnosis of SARS-CoV-2 by FDA under an Emergency Use Authorization (EUA). This EUA will remain in effect (meaning this test can be used) for the duration of the COVID-19 declaration under Section 564(b)(1) of the Act, 21 U.S.C. section 360bbb-3(b)(1), unless the authorization is terminated or revoked.  Performed at [facility], [place] [address], [place], [place] [address]     Labs: CBC: Recent Labs  Lab 01/17/22 0013 01/18/22 0650  WBC 10.1 7.2  NEUTROABS 7.6  --   HGB 11.8* 11.7*  HCT 34.9* 35.6*  MCV 96.4 96.2  PLT 272 277   Basic Metabolic Panel: Recent Labs  Lab 01/17/22 0013 01/18/22 0650  NA 134* 137  K 4.0 4.4  CL 104 106  CO2 23 25  GLUCOSE 172*  121*  BUN 16 13  CREATININE 0.75 0.64  CALCIUM 8.7* 8.6*   Liver Function Tests: Recent Labs  Lab 01/17/22 0013 01/18/22 0650  AST 31 29  ALT 22 20  ALKPHOS 95 93  BILITOT 0.5 0.5  PROT 6.4* 6.5  ALBUMIN 3.2* 3.3*   CBG: No results for input(s): "GLUCAP" in the last 168 hours.  Discharge time spent: less than 30 minutes.  Signed: Murray Hodgkins, MD [facility] 01/20/2022

## 2022-01-20 NOTE — NC FL2 (Signed)
[place] LEVEL OF CARE SCREENING TOOL     IDENTIFICATION  Patient Name: Kristin Bryant Birthdate: 1922-11-02 Sex: female Admission Date (Current Location): 01/17/2022  [facility] and [place] Number:  Herbalist and Address:  [facility],  [place], [place]      Provider Number: [id number]  Attending Physician Name and Address:  Samuella Cota, MD  Relative Name and Phone Number:  Debe Anfinson (son) [phone]    Current Level of Care: Hospital Recommended Level of Care: [place] Prior Approval Number:    Date Approved/Denied:   PASRR Number:    Discharge Plan: Domiciliary (Rest home)    Current Diagnoses: Patient Active Problem List   Diagnosis Date Noted   Abdominal pain 01/18/2022   Chronic pancreatitis ([place]) 01/18/2022   Vomiting 01/18/2022   Fall at home, initial encounter 01/18/2022   Thoracic compression fracture ([place]) 01/18/2022   Constipation 03/11/2021   HTN (hypertension) 03/11/2021   HLD (hyperlipidemia) 03/11/2021   Hypothyroidism 03/11/2021   Cognitive impairment 03/11/2021   Pubic ramus fracture ([place]) 03/10/2021   Malignant neoplasm of lower-outer quadrant of right breast of female, estrogen receptor positive ([place]) 01/22/2019   Syncope 10/15/2016   Fatigue 10/15/2016   Abnormal EKG 10/15/2016   Chest pain 10/15/2016    Orientation RESPIRATION BLADDER Height & Weight     Self, Situation  Normal Continent Weight: 64.1 kg Height:  '5\' 2"'$  (157.5 cm)  BEHAVIORAL SYMPTOMS/MOOD NEUROLOGICAL BOWEL NUTRITION STATUS      Continent Diet  AMBULATORY STATUS COMMUNICATION OF NEEDS Skin   Limited Assist Verbally Other (Comment)                       Personal Care Assistance Level of Assistance  Bathing, Feeding, Dressing Bathing Assistance: Limited assistance Feeding assistance: Independent Dressing Assistance: Limited assistance     Functional Limitations Info  Sight,  Hearing, Speech Sight Info: Impaired Hearing Info: Adequate Speech Info: Adequate    SPECIAL CARE FACTORS FREQUENCY  PT (By licensed PT), OT (By licensed OT)     PT Frequency: min 2x/wk with HH OT Frequency: min 2x/wk with HH            Contractures Contractures Info: Not present    Additional Factors Info  Psychotropic Code Status Info: DNR Allergies Info: Aspirin, Benzonatate, Celexa (Citalopram), Chlorzoxazone, Citalopram Hydrobromide, Cortisone, Diclofenac-misoprostol, Escitalopram Oxalate, Guaifenesin, Guaifenesin & Derivatives, Hydrocodone, Ipratropium Bromide, Lily Of [place] Valley Herb (Convallariae [place]), Mirtazapine, Naproxen, Neosporin (Bacitracin-polymyxin B), Prednisone, Pseudoephedrine, Ranitidine, Ranitidine Hcl, Sudafed (Pseudoephedrine Hcl), Sulfa Antibiotics, Epinephrine, Latex Psychotropic Info: sertraline (ZOLOFT) tablet 25 mg         Current Medications (01/20/2022):  This is the current hospital active medication list Current Facility-Administered Medications  Medication Dose Route Frequency Provider Last Rate Last Admin   acetaminophen (TYLENOL) tablet 650 mg  650 mg Oral Q4H Samuella Cota, MD   650 mg at 01/20/22 1130   cephALEXin (KEFLEX) capsule 500 mg  500 mg Oral TID Samuella Cota, MD   500 mg at 01/20/22 1131   latanoprost (XALATAN) 0.005 % ophthalmic solution 1 drop  1 drop Both Eyes QHS Samuella Cota, MD   1 drop at 01/19/22 2227   levothyroxine (SYNTHROID) tablet 88 mcg  88 mcg Oral Daily Samuella Cota, MD   88 mcg at 01/20/22 0606   lidocaine (LIDODERM) 5 % 2 patch  2 patch Transdermal q AM Sarajane Jews,  Melene Plan, MD   2 patch at 01/20/22 3500   lipase/protease/amylase (CREON) capsule 24,000 Units  24,000 Units Oral TID Francie Massing, MD   24,000 Units at 01/20/22 1129   naloxone [facility]) injection 0.4 mg  0.4 mg Intravenous PRN Shela Leff, MD       ondansetron [facility]) injection 4 mg  4 mg Intravenous Q6H PRN  Shela Leff, MD       polyethylene glycol (MIRALAX / GLYCOLAX) packet 17 g  17 g Oral Daily Danton Clap H, DO   17 g at 01/20/22 1129   sertraline (ZOLOFT) tablet 25 mg  25 mg Oral Daily Samuella Cota, MD   25 mg at 01/20/22 1130   timolol (TIMOPTIC) 0.5 % ophthalmic solution 1 drop  1 drop Both Eyes BID Samuella Cota, MD   1 drop at 01/20/22 1131   traMADol (ULTRAM) tablet 25 mg  25 mg Oral Daily Samuella Cota, MD   25 mg at 01/20/22 1131   traMADol (ULTRAM) tablet 25 mg  25 mg Oral Q8H PRN Samuella Cota, MD   25 mg at 01/18/22 2342     Discharge Medications: STOP taking these medications     benazepril 20 MG tablet Commonly known as: LOTENSIN    cephALEXin 500 MG capsule Commonly known as: KEFLEX    diphenhydrAMINE 25 MG tablet Commonly known as: BENADRYL    HYDROcodone-acetaminophen 5-325 MG tablet Commonly known as: Norco    ondansetron 4 MG disintegrating tablet Commonly known as: ZOFRAN-ODT           TAKE these medications     acetaminophen 500 MG tablet Commonly known as: TYLENOL Take 1,000 mg by mouth every 8 (eight) hours.    docusate sodium 100 MG capsule Commonly known as: COLACE Take 1 capsule (100 mg total) by mouth every 12 (twelve) hours. What changed: when to take this    Dulcolax 5 MG EC tablet Generic drug: bisacodyl Take 1 tablet (5 mg total) by mouth daily as needed for moderate constipation (if no bowel movement with miralax and stool softner). What changed: reasons to take this    latanoprost 0.005 % ophthalmic solution Commonly known as: XALATAN Place 1 drop into both eyes at bedtime.    levothyroxine 88 MCG tablet Commonly known as: SYNTHROID Take 88 mcg by mouth daily.    lidocaine 4 % Place 2 patches onto the skin in the morning. To right ankle, lower back.    Pancrelipase (Lip-Prot-Amyl) 24000-76000 units Cpep Take 1 capsule (24,000 Units total) by mouth 3 (three) times daily before meals.     polyethylene glycol powder 17 GM/SCOOP powder Commonly known as: GLYCOLAX/MIRALAX Take 17 g by mouth in the morning and at bedtime.    PRESERVISION AREDS 2 PO Take 1 capsule by mouth daily.    QUEtiapine 25 MG tablet Commonly known as: SEROQUEL Take 25 mg by mouth at bedtime.    sertraline 25 MG tablet Commonly known as: ZOLOFT Take 25 mg by mouth daily.    timolol 0.5 % ophthalmic solution Commonly known as: TIMOPTIC Place 1 drop into both eyes 2 (two) times daily.    traMADol 50 MG tablet Commonly known as: ULTRAM Take 25 mg by mouth See admin instructions. 2 Entries on MAR :  25 mg every morning + 25 mg every 8 hours as needed for pain    Vitamin D-3 125 MCG (5000 UT) Tabs Take 5,000 Units by mouth daily.  Relevant Imaging Results:  Relevant Lab Results:   Additional Information SSN [id number]  Henrietta Dine, RN

## 2022-01-22 ENCOUNTER — Telehealth: Payer: Self-pay

## 2022-01-22 DIAGNOSIS — M6281 Muscle weakness (generalized): Secondary | ICD-10-CM | POA: Diagnosis not present

## 2022-01-22 NOTE — Telephone Encounter (Signed)
     Patient  visit on 01/08/2022  at [facility] was for Wedge compression fracture of T11-T12 vertebra, initial encounter for closed fracture.  Have you been able to follow up with your primary care physician? Yes  The patient was or was not able to obtain any needed medicine or equipment. Patient was able to obtain medications.  Are there diet recommendations that you are having difficulty following? No  Patient expresses understanding of discharge instructions and education provided has no other needs at this time.    [place] Resource Care Guide   [email or web address]  ?? [phone]   Website: [email or web address]  [email or web address]

## 2022-01-23 DIAGNOSIS — R1310 Dysphagia, unspecified: Secondary | ICD-10-CM | POA: Diagnosis not present

## 2022-01-23 DIAGNOSIS — R4701 Aphasia: Secondary | ICD-10-CM | POA: Diagnosis not present

## 2022-01-24 DIAGNOSIS — I951 Orthostatic hypotension: Secondary | ICD-10-CM | POA: Diagnosis not present

## 2022-01-24 DIAGNOSIS — E7800 Pure hypercholesterolemia, unspecified: Secondary | ICD-10-CM | POA: Diagnosis not present

## 2022-01-24 DIAGNOSIS — K861 Other chronic pancreatitis: Secondary | ICD-10-CM | POA: Diagnosis not present

## 2022-01-24 DIAGNOSIS — I10 Essential (primary) hypertension: Secondary | ICD-10-CM | POA: Diagnosis not present

## 2022-01-24 DIAGNOSIS — E039 Hypothyroidism, unspecified: Secondary | ICD-10-CM | POA: Diagnosis not present

## 2022-01-25 DIAGNOSIS — I10 Essential (primary) hypertension: Secondary | ICD-10-CM | POA: Diagnosis not present

## 2022-01-25 DIAGNOSIS — E039 Hypothyroidism, unspecified: Secondary | ICD-10-CM | POA: Diagnosis not present

## 2022-01-25 DIAGNOSIS — E7800 Pure hypercholesterolemia, unspecified: Secondary | ICD-10-CM | POA: Diagnosis not present

## 2022-01-25 DIAGNOSIS — K861 Other chronic pancreatitis: Secondary | ICD-10-CM | POA: Diagnosis not present

## 2022-01-25 DIAGNOSIS — I951 Orthostatic hypotension: Secondary | ICD-10-CM | POA: Diagnosis not present

## 2022-01-26 DIAGNOSIS — K861 Other chronic pancreatitis: Secondary | ICD-10-CM | POA: Diagnosis not present

## 2022-01-26 DIAGNOSIS — E7800 Pure hypercholesterolemia, unspecified: Secondary | ICD-10-CM | POA: Diagnosis not present

## 2022-01-26 DIAGNOSIS — I10 Essential (primary) hypertension: Secondary | ICD-10-CM | POA: Diagnosis not present

## 2022-01-26 DIAGNOSIS — E039 Hypothyroidism, unspecified: Secondary | ICD-10-CM | POA: Diagnosis not present

## 2022-01-26 DIAGNOSIS — I951 Orthostatic hypotension: Secondary | ICD-10-CM | POA: Diagnosis not present

## 2022-01-29 DIAGNOSIS — H353221 Exudative age-related macular degeneration, left eye, with active choroidal neovascularization: Secondary | ICD-10-CM | POA: Diagnosis not present

## 2022-01-30 DIAGNOSIS — I951 Orthostatic hypotension: Secondary | ICD-10-CM | POA: Diagnosis not present

## 2022-01-30 DIAGNOSIS — K861 Other chronic pancreatitis: Secondary | ICD-10-CM | POA: Diagnosis not present

## 2022-01-30 DIAGNOSIS — E039 Hypothyroidism, unspecified: Secondary | ICD-10-CM | POA: Diagnosis not present

## 2022-01-30 DIAGNOSIS — I10 Essential (primary) hypertension: Secondary | ICD-10-CM | POA: Diagnosis not present

## 2022-01-30 DIAGNOSIS — E7800 Pure hypercholesterolemia, unspecified: Secondary | ICD-10-CM | POA: Diagnosis not present

## 2022-02-01 DIAGNOSIS — I951 Orthostatic hypotension: Secondary | ICD-10-CM | POA: Diagnosis not present

## 2022-02-01 DIAGNOSIS — E039 Hypothyroidism, unspecified: Secondary | ICD-10-CM | POA: Diagnosis not present

## 2022-02-01 DIAGNOSIS — K861 Other chronic pancreatitis: Secondary | ICD-10-CM | POA: Diagnosis not present

## 2022-02-01 DIAGNOSIS — I10 Essential (primary) hypertension: Secondary | ICD-10-CM | POA: Diagnosis not present

## 2022-02-01 DIAGNOSIS — E7800 Pure hypercholesterolemia, unspecified: Secondary | ICD-10-CM | POA: Diagnosis not present

## 2022-02-06 DIAGNOSIS — I951 Orthostatic hypotension: Secondary | ICD-10-CM | POA: Diagnosis not present

## 2022-02-06 DIAGNOSIS — E7800 Pure hypercholesterolemia, unspecified: Secondary | ICD-10-CM | POA: Diagnosis not present

## 2022-02-06 DIAGNOSIS — E039 Hypothyroidism, unspecified: Secondary | ICD-10-CM | POA: Diagnosis not present

## 2022-02-06 DIAGNOSIS — H353211 Exudative age-related macular degeneration, right eye, with active choroidal neovascularization: Secondary | ICD-10-CM | POA: Diagnosis not present

## 2022-02-06 DIAGNOSIS — I10 Essential (primary) hypertension: Secondary | ICD-10-CM | POA: Diagnosis not present

## 2022-02-06 DIAGNOSIS — K861 Other chronic pancreatitis: Secondary | ICD-10-CM | POA: Diagnosis not present

## 2022-03-07 ENCOUNTER — Emergency Department (HOSPITAL_COMMUNITY)
Admission: EM | Admit: 2022-03-07 | Discharge: 2022-03-07 | Disposition: A | Discharge status: Home / Self Care | Payer: Medicare Other | Attending: Emergency Medicine | Admitting: Emergency Medicine

## 2022-03-07 ENCOUNTER — Other Ambulatory Visit: Payer: Self-pay

## 2022-03-07 ENCOUNTER — Encounter (HOSPITAL_COMMUNITY): Payer: Self-pay | Admitting: Emergency Medicine

## 2022-03-07 DIAGNOSIS — Z91040 Latex allergy status: Secondary | ICD-10-CM | POA: Diagnosis not present

## 2022-03-07 DIAGNOSIS — H5789 Other specified disorders of eye and adnexa: Secondary | ICD-10-CM | POA: Diagnosis present

## 2022-03-07 DIAGNOSIS — H04321 Acute dacryocystitis of right lacrimal passage: Secondary | ICD-10-CM | POA: Diagnosis not present

## 2022-03-07 DIAGNOSIS — H04301 Unspecified dacryocystitis of right lacrimal passage: Secondary | ICD-10-CM

## 2022-03-07 MED ORDER — AMOXICILLIN-POT CLAVULANATE 875-125 MG PO TABS: 1.0000 | ORAL_TABLET | Freq: Two times a day (BID) | ORAL | 0 refills | Status: DC

## 2022-03-07 NOTE — ED Provider Triage Note (Signed)
Emergency Medicine Provider Triage Evaluation Note  RICKIA FREEBURG , a 88 y.o. female  was evaluated in triage.  Pt complains of swelling at the corner of the medial portion of the right eye.  Family states that the patient had this spot swell up approximately 2 weeks ago but that is subsided.  States that over the past few days it has come back larger than before.  She endorses trying warm compresses "once or twice" at home.  She was told by someone at her nursing facility this may be a clogged tear duct  Review of Systems  Positive: As above Negative: As above  Physical Exam  BP 133/67 (BP Location: Left Arm)   Pulse 94   Temp 98.2 F (36.8 C) (Oral)   Resp 14   Ht '5\' 2"'$  (1.575 m)   Wt 64 kg   SpO2 97%   BMI 25.79 kg/m  Gen:   Awake, no distress   Resp:  Normal effort  MSK:   Moves extremities without difficulty  Other:    Medical Decision Making  Medically screening exam initiated at 1:45 PM.  Appropriate orders placed.  AALIYAH GAVEL was informed that the remainder of the evaluation will be completed by another provider, this initial triage assessment does not replace that evaluation, and the importance of remaining in the ED until their evaluation is complete.     Dorothyann Peng, PA-C 03/07/22 1354

## 2022-03-07 NOTE — ED Triage Notes (Signed)
Patient arrives by POV with son c/o right eye cyst with swelling. Son states there was a small cyst there a couple weeks ago that improved however the past couple days has grown and become redder. Denies any drainage.

## 2022-03-07 NOTE — Discharge Instructions (Addendum)
Please follow-up in clinic with ophthalmology for repeat assessment with Dr. Nancy Fetter.  Continue warm compresses to the right eye.  Your symptoms are consistent with dacryocystitis which is infection in your tear duct on the right.  In addition to warm compresses, recommend initiation of oral antibiotics which have been prescribed to your pharmacy on file.  Return for worsening redness, swelling, pain, difficulty/pain with eye movements despite outpatient antibiotics.  Take a probiotic with Augmentin and take on a full stomach as it can cause nausea when taken on an empty stomach.

## 2022-03-07 NOTE — ED Provider Notes (Signed)
[facility] DEPT Provider Note   CSN: [id number] Arrival date & time: 03/07/22  1319     History  Chief Complaint  Patient presents with   Eye Problem    Kristin Bryant is a 88 y.o. female.   Eye Problem    88 year old female presenting to the emergency department with swelling about the right medial thigh.  The patient states that she has had some swelling of the right medial eye for the past 30 days.  She has been trying warm compresses at home which had initially been helping but over the past 2 days she has noted significantly larger swelling along the tear duct medially along her nose on the right.  She denies any pain with extraocular movements.  She denies any fevers or chills.  She denies any drainage or discharge.  Home Medications Prior to Admission medications   Medication Sig Start Date End Date Taking? Authorizing Provider  acetaminophen (TYLENOL) 500 MG tablet Take 1,000 mg by mouth every 8 (eight) hours.    [provider]  amoxicillin-clavulanate (AUGMENTIN) 875-125 MG tablet Take 1 tablet by mouth every 12 (twelve) hours. 03/07/22  Yes Regan Lemming, MD  bisacodyl (DULCOLAX) 5 MG EC tablet Take 1 tablet (5 mg total) by mouth daily as needed for moderate constipation (if no bowel movement with miralax and stool softner). Patient taking differently: Take 5 mg by mouth daily as needed for moderate constipation. 03/14/21 03/14/22  Pokhrel, Corrie Mckusick, MD  Cholecalciferol (VITAMIN D-3) 125 MCG (5000 UT) TABS Take 5,000 Units by mouth daily.    [provider]  docusate sodium (COLACE) 100 MG capsule Take 1 capsule (100 mg total) by mouth every 12 (twelve) hours. Patient taking differently: Take 100 mg by mouth 2 (two) times daily. 07/29/20   Little, Wenda Overland, MD  latanoprost (XALATAN) 0.005 % ophthalmic solution Place 1 drop into both eyes at bedtime.    [provider]  levothyroxine (SYNTHROID) 88 MCG tablet Take 88  mcg by mouth daily. 12/08/21   [provider]  lidocaine 4 % Place 2 patches onto the skin in the morning. To right ankle, lower back.    [provider]  lipase/protease/amylase 24000-76000 units CPEP Take 1 capsule (24,000 Units total) by mouth 3 (three) times daily before meals. 01/20/22   Samuella Cota, MD  Multiple Vitamins-Minerals (PRESERVISION AREDS 2 PO) Take 1 capsule by mouth daily.    [provider]  polyethylene glycol powder (GLYCOLAX/MIRALAX) 17 GM/SCOOP powder Take 17 g by mouth in the morning and at bedtime. 01/20/22   Samuella Cota, MD  QUEtiapine (SEROQUEL) 25 MG tablet Take 25 mg by mouth at bedtime. 12/08/21   [provider]  sertraline (ZOLOFT) 25 MG tablet Take 25 mg by mouth daily. 12/08/21   [provider]  timolol (TIMOPTIC) 0.5 % ophthalmic solution Place 1 drop into both eyes 2 (two) times daily.    [provider]  traMADol (ULTRAM) 50 MG tablet Take 25 mg by mouth See admin instructions. 2 Entries on MAR :  25 mg every morning + 25 mg every 8 hours as needed for pain 12/11/21   [provider]      Allergies    Aspirin, Benzonatate, Celexa [citalopram], Chlorzoxazone, Citalopram hydrobromide, Cortisone, Diclofenac-misoprostol, Escitalopram oxalate, Guaifenesin, Guaifenesin & derivatives, Hydrocodone, Ipratropium bromide, Lily of the valley herb [convallariae majalis], Mirtazapine, Naproxen, Neosporin [bacitracin-polymyxin b], Prednisone, Pseudoephedrine, Ranitidine, Ranitidine hcl, Sudafed [pseudoephedrine hcl], Sulfa antibiotics, Epinephrine, and  Latex    Review of Systems   Review of Systems  All other systems reviewed and are negative.   Physical Exam Updated Vital Signs BP 133/67 (BP Location: Left Arm)   Pulse 94   Temp 98.2 F (36.8 C) (Oral)   Resp 14   Ht '5\' 2"'$  (1.575 m)   Wt 64 kg   SpO2 97%   BMI 25.79 kg/m  Physical Exam Vitals and nursing note reviewed.  Constitutional:       General: She is not in acute distress. HENT:     Head: Normocephalic and atraumatic.     Comments: Area of swelling, induration and fluctuance with surrounding erythema along the medial lacrimal duct, no pain with extraocular movements, no significant conjunctivitis, no purulence appreciated emanating from the eye, no proptosis Eyes:     Conjunctiva/sclera: Conjunctivae normal.     Pupils: Pupils are equal, round, and reactive to light.  Cardiovascular:     Rate and Rhythm: Normal rate and regular rhythm.  Pulmonary:     Effort: Pulmonary effort is normal. No respiratory distress.  Abdominal:     General: There is no distension.     Tenderness: There is no guarding.  Musculoskeletal:        General: No deformity or signs of injury.     Cervical back: Neck supple.  Skin:    Findings: No lesion or rash.  Neurological:     General: No focal deficit present.     Mental Status: She is alert. Mental status is at baseline.     ED Results / Procedures / Treatments   Labs (all labs ordered are listed, but only abnormal results are displayed) Labs Reviewed - No data to display  EKG None  Radiology No results found.  Procedures Procedures    Medications Ordered in ED Medications - No data to display  ED Course/ Medical Decision Making/ A&P                           Medical Decision Making Risk Prescription drug management.    88 year old female presenting to the emergency department with swelling about the right medial thigh.  The patient states that she has had some swelling of the right medial eye for the past 30 days.  She has been trying warm compresses at home which had initially been helping but over the past 2 days she has noted significantly larger swelling along the tear duct medially along her nose on the right.  She denies any pain with extraocular movements.  She denies any fevers or chills.  She denies any drainage or discharge.  On arrival, the patient was  vitally stable.  Physical exam significant for an Area of swelling, induration and fluctuance with surrounding erythema along the medial lacrimal duct, no pain with extraocular movements, no significant conjunctivitis, no purulence appreciated emanating from the eye, no proptosis.   Symptoms are consistent with likely dacryocystitis.  The patient has been managing at home with warm compresses but symptoms have worsened over the last 2 days.  No pain with extraocular movement.  Some erythematous changes around the affected area but no significant concern for periorbital cellulitis, no evidence for orbital cellulitis.  Patient is well-appearing and otherwise vitally stable.  Vision is intact.  While the patient to perform warm compresses, place the patient on oral antibiotics and have her follow-up outpatient for repeat assessment with ophthalmology in clinic.  With Dr. Nancy Fetter of  on-call ophthalmology who will send a message to her clinic scheduler to arrange for outpatient follow-up.  We will place the patient on a course of Augmentin.  Advised continued warm compresses, return precautions provided.  Stable for discharge.   Final Clinical Impression(s) / ED Diagnoses Final diagnoses:  Dacrocystitis, right    Rx / DC Orders ED Discharge Orders          Ordered    Ambulatory referral to Ophthalmology        03/07/22 1529    amoxicillin-clavulanate (AUGMENTIN) 875-125 MG tablet  Every 12 hours        03/07/22 1532              Regan Lemming, MD 03/07/22 1532

## 2022-03-27 ENCOUNTER — Telehealth: Payer: Self-pay

## 2022-03-27 NOTE — Telephone Encounter (Signed)
     Patient  visit on 03/07/2022  at [facility] was for Dacrocystitis, right.  Have you been able to follow up with your primary care physician? Patient has seen Dr. Armanda Heritage.  The patient was or was not able to obtain any needed medicine or equipment. Patient obtained medication.  Are there diet recommendations that you are having difficulty following? No diet recommendations made.  Patient expresses understanding of discharge instructions and education provided has no other needs at this time.    [place] Resource Care Guide   [email or web address]  ?? [phone]   Website: [email or web address]  [email or web address]

## 2022-04-19 DIAGNOSIS — H353221 Exudative age-related macular degeneration, left eye, with active choroidal neovascularization: Secondary | ICD-10-CM | POA: Diagnosis not present

## 2022-04-23 DIAGNOSIS — H0279 Other degenerative disorders of eyelid and periocular area: Secondary | ICD-10-CM | POA: Diagnosis not present

## 2022-04-23 DIAGNOSIS — H04551 Acquired stenosis of right nasolacrimal duct: Secondary | ICD-10-CM | POA: Diagnosis not present

## 2022-04-23 DIAGNOSIS — H04411 Chronic dacryocystitis of right lacrimal passage: Secondary | ICD-10-CM | POA: Diagnosis not present

## 2022-04-24 DIAGNOSIS — H353211 Exudative age-related macular degeneration, right eye, with active choroidal neovascularization: Secondary | ICD-10-CM | POA: Diagnosis not present

## 2022-05-03 DIAGNOSIS — E039 Hypothyroidism, unspecified: Secondary | ICD-10-CM | POA: Diagnosis not present

## 2022-05-03 DIAGNOSIS — E559 Vitamin D deficiency, unspecified: Secondary | ICD-10-CM | POA: Diagnosis not present

## 2022-05-10 DIAGNOSIS — E039 Hypothyroidism, unspecified: Secondary | ICD-10-CM | POA: Diagnosis not present

## 2022-05-14 DIAGNOSIS — H0419 Other specified disorders of lacrimal gland: Secondary | ICD-10-CM | POA: Diagnosis not present

## 2022-05-14 DIAGNOSIS — H04221 Epiphora due to insufficient drainage, right lacrimal gland: Secondary | ICD-10-CM | POA: Diagnosis not present

## 2022-05-14 DIAGNOSIS — H04551 Acquired stenosis of right nasolacrimal duct: Secondary | ICD-10-CM | POA: Diagnosis not present

## 2022-05-14 DIAGNOSIS — H04561 Stenosis of right lacrimal punctum: Secondary | ICD-10-CM | POA: Diagnosis not present

## 2022-05-14 DIAGNOSIS — H04411 Chronic dacryocystitis of right lacrimal passage: Secondary | ICD-10-CM | POA: Diagnosis not present

## 2022-05-17 DIAGNOSIS — I10 Essential (primary) hypertension: Secondary | ICD-10-CM | POA: Diagnosis not present

## 2022-05-22 DIAGNOSIS — H353221 Exudative age-related macular degeneration, left eye, with active choroidal neovascularization: Secondary | ICD-10-CM | POA: Diagnosis not present

## 2022-05-29 DIAGNOSIS — I10 Essential (primary) hypertension: Secondary | ICD-10-CM | POA: Diagnosis not present

## 2022-05-29 DIAGNOSIS — H04411 Chronic dacryocystitis of right lacrimal passage: Secondary | ICD-10-CM | POA: Diagnosis not present

## 2022-05-29 DIAGNOSIS — H04301 Unspecified dacryocystitis of right lacrimal passage: Secondary | ICD-10-CM | POA: Diagnosis not present

## 2022-05-29 DIAGNOSIS — H57813 Brow ptosis, bilateral: Secondary | ICD-10-CM | POA: Diagnosis not present

## 2022-05-29 DIAGNOSIS — H02834 Dermatochalasis of left upper eyelid: Secondary | ICD-10-CM | POA: Diagnosis not present

## 2022-05-29 DIAGNOSIS — K5909 Other constipation: Secondary | ICD-10-CM | POA: Diagnosis not present

## 2022-05-29 DIAGNOSIS — H04221 Epiphora due to insufficient drainage, right lacrimal gland: Secondary | ICD-10-CM | POA: Diagnosis not present

## 2022-05-29 DIAGNOSIS — H02831 Dermatochalasis of right upper eyelid: Secondary | ICD-10-CM | POA: Diagnosis not present

## 2022-05-29 DIAGNOSIS — H04551 Acquired stenosis of right nasolacrimal duct: Secondary | ICD-10-CM | POA: Diagnosis not present

## 2022-05-29 DIAGNOSIS — F32A Depression, unspecified: Secondary | ICD-10-CM | POA: Diagnosis not present

## 2022-05-29 DIAGNOSIS — H02413 Mechanical ptosis of bilateral eyelids: Secondary | ICD-10-CM | POA: Diagnosis not present

## 2022-05-31 DIAGNOSIS — H353211 Exudative age-related macular degeneration, right eye, with active choroidal neovascularization: Secondary | ICD-10-CM | POA: Diagnosis not present

## 2022-06-07 DIAGNOSIS — F32A Depression, unspecified: Secondary | ICD-10-CM | POA: Diagnosis not present

## 2022-06-07 DIAGNOSIS — F0393 Unspecified dementia, unspecified severity, with mood disturbance: Secondary | ICD-10-CM | POA: Diagnosis not present

## 2022-06-07 DIAGNOSIS — F0394 Unspecified dementia, unspecified severity, with anxiety: Secondary | ICD-10-CM | POA: Diagnosis not present

## 2022-06-07 DIAGNOSIS — F03918 Unspecified dementia, unspecified severity, with other behavioral disturbance: Secondary | ICD-10-CM | POA: Diagnosis not present

## 2022-06-13 DIAGNOSIS — F0394 Unspecified dementia, unspecified severity, with anxiety: Secondary | ICD-10-CM | POA: Diagnosis not present

## 2022-06-13 DIAGNOSIS — F0393 Unspecified dementia, unspecified severity, with mood disturbance: Secondary | ICD-10-CM | POA: Diagnosis not present

## 2022-06-13 DIAGNOSIS — F03918 Unspecified dementia, unspecified severity, with other behavioral disturbance: Secondary | ICD-10-CM | POA: Diagnosis not present

## 2022-06-13 DIAGNOSIS — F32A Depression, unspecified: Secondary | ICD-10-CM | POA: Diagnosis not present

## 2022-06-15 DIAGNOSIS — F03918 Unspecified dementia, unspecified severity, with other behavioral disturbance: Secondary | ICD-10-CM | POA: Diagnosis not present

## 2022-06-15 DIAGNOSIS — F0393 Unspecified dementia, unspecified severity, with mood disturbance: Secondary | ICD-10-CM | POA: Diagnosis not present

## 2022-06-15 DIAGNOSIS — F32A Depression, unspecified: Secondary | ICD-10-CM | POA: Diagnosis not present

## 2022-06-15 DIAGNOSIS — F0394 Unspecified dementia, unspecified severity, with anxiety: Secondary | ICD-10-CM | POA: Diagnosis not present

## 2022-06-20 DIAGNOSIS — F0394 Unspecified dementia, unspecified severity, with anxiety: Secondary | ICD-10-CM | POA: Diagnosis not present

## 2022-06-20 DIAGNOSIS — F0393 Unspecified dementia, unspecified severity, with mood disturbance: Secondary | ICD-10-CM | POA: Diagnosis not present

## 2022-06-20 DIAGNOSIS — F03918 Unspecified dementia, unspecified severity, with other behavioral disturbance: Secondary | ICD-10-CM | POA: Diagnosis not present

## 2022-06-20 DIAGNOSIS — F32A Depression, unspecified: Secondary | ICD-10-CM | POA: Diagnosis not present

## 2022-06-22 DIAGNOSIS — F0394 Unspecified dementia, unspecified severity, with anxiety: Secondary | ICD-10-CM | POA: Diagnosis not present

## 2022-06-22 DIAGNOSIS — F32A Depression, unspecified: Secondary | ICD-10-CM | POA: Diagnosis not present

## 2022-06-22 DIAGNOSIS — H353221 Exudative age-related macular degeneration, left eye, with active choroidal neovascularization: Secondary | ICD-10-CM | POA: Diagnosis not present

## 2022-06-22 DIAGNOSIS — F0393 Unspecified dementia, unspecified severity, with mood disturbance: Secondary | ICD-10-CM | POA: Diagnosis not present

## 2022-06-22 DIAGNOSIS — F03918 Unspecified dementia, unspecified severity, with other behavioral disturbance: Secondary | ICD-10-CM | POA: Diagnosis not present

## 2022-06-25 DIAGNOSIS — H04221 Epiphora due to insufficient drainage, right lacrimal gland: Secondary | ICD-10-CM | POA: Diagnosis not present

## 2022-06-25 DIAGNOSIS — H02831 Dermatochalasis of right upper eyelid: Secondary | ICD-10-CM | POA: Diagnosis not present

## 2022-06-25 DIAGNOSIS — H02834 Dermatochalasis of left upper eyelid: Secondary | ICD-10-CM | POA: Diagnosis not present

## 2022-06-25 DIAGNOSIS — H04551 Acquired stenosis of right nasolacrimal duct: Secondary | ICD-10-CM | POA: Diagnosis not present

## 2022-06-25 DIAGNOSIS — H04411 Chronic dacryocystitis of right lacrimal passage: Secondary | ICD-10-CM | POA: Diagnosis not present

## 2022-06-25 DIAGNOSIS — H57813 Brow ptosis, bilateral: Secondary | ICD-10-CM | POA: Diagnosis not present

## 2022-06-26 DIAGNOSIS — F0394 Unspecified dementia, unspecified severity, with anxiety: Secondary | ICD-10-CM | POA: Diagnosis not present

## 2022-06-26 DIAGNOSIS — F03918 Unspecified dementia, unspecified severity, with other behavioral disturbance: Secondary | ICD-10-CM | POA: Diagnosis not present

## 2022-06-26 DIAGNOSIS — F0393 Unspecified dementia, unspecified severity, with mood disturbance: Secondary | ICD-10-CM | POA: Diagnosis not present

## 2022-06-26 DIAGNOSIS — E039 Hypothyroidism, unspecified: Secondary | ICD-10-CM | POA: Diagnosis not present

## 2022-06-26 DIAGNOSIS — I10 Essential (primary) hypertension: Secondary | ICD-10-CM | POA: Diagnosis not present

## 2022-06-26 DIAGNOSIS — F32A Depression, unspecified: Secondary | ICD-10-CM | POA: Diagnosis not present

## 2022-06-29 DIAGNOSIS — F32A Depression, unspecified: Secondary | ICD-10-CM | POA: Diagnosis not present

## 2022-06-29 DIAGNOSIS — F0393 Unspecified dementia, unspecified severity, with mood disturbance: Secondary | ICD-10-CM | POA: Diagnosis not present

## 2022-06-29 DIAGNOSIS — F0394 Unspecified dementia, unspecified severity, with anxiety: Secondary | ICD-10-CM | POA: Diagnosis not present

## 2022-06-29 DIAGNOSIS — F03918 Unspecified dementia, unspecified severity, with other behavioral disturbance: Secondary | ICD-10-CM | POA: Diagnosis not present

## 2022-07-04 DIAGNOSIS — F03918 Unspecified dementia, unspecified severity, with other behavioral disturbance: Secondary | ICD-10-CM | POA: Diagnosis not present

## 2022-07-04 DIAGNOSIS — F32A Depression, unspecified: Secondary | ICD-10-CM | POA: Diagnosis not present

## 2022-07-04 DIAGNOSIS — F0394 Unspecified dementia, unspecified severity, with anxiety: Secondary | ICD-10-CM | POA: Diagnosis not present

## 2022-07-04 DIAGNOSIS — F0393 Unspecified dementia, unspecified severity, with mood disturbance: Secondary | ICD-10-CM | POA: Diagnosis not present

## 2022-07-05 DIAGNOSIS — H353211 Exudative age-related macular degeneration, right eye, with active choroidal neovascularization: Secondary | ICD-10-CM | POA: Diagnosis not present

## 2022-07-11 DIAGNOSIS — F32A Depression, unspecified: Secondary | ICD-10-CM | POA: Diagnosis not present

## 2022-07-11 DIAGNOSIS — F0394 Unspecified dementia, unspecified severity, with anxiety: Secondary | ICD-10-CM | POA: Diagnosis not present

## 2022-07-11 DIAGNOSIS — F0393 Unspecified dementia, unspecified severity, with mood disturbance: Secondary | ICD-10-CM | POA: Diagnosis not present

## 2022-07-11 DIAGNOSIS — F03918 Unspecified dementia, unspecified severity, with other behavioral disturbance: Secondary | ICD-10-CM | POA: Diagnosis not present

## 2022-07-19 DIAGNOSIS — F03918 Unspecified dementia, unspecified severity, with other behavioral disturbance: Secondary | ICD-10-CM | POA: Diagnosis not present

## 2022-07-19 DIAGNOSIS — F32A Depression, unspecified: Secondary | ICD-10-CM | POA: Diagnosis not present

## 2022-07-19 DIAGNOSIS — F0394 Unspecified dementia, unspecified severity, with anxiety: Secondary | ICD-10-CM | POA: Diagnosis not present

## 2022-07-19 DIAGNOSIS — F0393 Unspecified dementia, unspecified severity, with mood disturbance: Secondary | ICD-10-CM | POA: Diagnosis not present

## 2022-07-23 DIAGNOSIS — H353221 Exudative age-related macular degeneration, left eye, with active choroidal neovascularization: Secondary | ICD-10-CM | POA: Diagnosis not present

## 2022-07-25 DIAGNOSIS — F0393 Unspecified dementia, unspecified severity, with mood disturbance: Secondary | ICD-10-CM | POA: Diagnosis not present

## 2022-07-25 DIAGNOSIS — F32A Depression, unspecified: Secondary | ICD-10-CM | POA: Diagnosis not present

## 2022-07-25 DIAGNOSIS — F0394 Unspecified dementia, unspecified severity, with anxiety: Secondary | ICD-10-CM | POA: Diagnosis not present

## 2022-07-25 DIAGNOSIS — F03918 Unspecified dementia, unspecified severity, with other behavioral disturbance: Secondary | ICD-10-CM | POA: Diagnosis not present

## 2022-08-03 DIAGNOSIS — F32A Depression, unspecified: Secondary | ICD-10-CM | POA: Diagnosis not present

## 2022-08-03 DIAGNOSIS — F0393 Unspecified dementia, unspecified severity, with mood disturbance: Secondary | ICD-10-CM | POA: Diagnosis not present

## 2022-08-03 DIAGNOSIS — F03918 Unspecified dementia, unspecified severity, with other behavioral disturbance: Secondary | ICD-10-CM | POA: Diagnosis not present

## 2022-08-03 DIAGNOSIS — F0394 Unspecified dementia, unspecified severity, with anxiety: Secondary | ICD-10-CM | POA: Diagnosis not present

## 2022-08-06 DIAGNOSIS — H353211 Exudative age-related macular degeneration, right eye, with active choroidal neovascularization: Secondary | ICD-10-CM | POA: Diagnosis not present

## 2022-08-09 DIAGNOSIS — F03918 Unspecified dementia, unspecified severity, with other behavioral disturbance: Secondary | ICD-10-CM | POA: Diagnosis not present

## 2022-08-09 DIAGNOSIS — F32A Depression, unspecified: Secondary | ICD-10-CM | POA: Diagnosis not present

## 2022-08-09 DIAGNOSIS — F0394 Unspecified dementia, unspecified severity, with anxiety: Secondary | ICD-10-CM | POA: Diagnosis not present

## 2022-08-09 DIAGNOSIS — F0393 Unspecified dementia, unspecified severity, with mood disturbance: Secondary | ICD-10-CM | POA: Diagnosis not present

## 2022-08-10 DIAGNOSIS — F0394 Unspecified dementia, unspecified severity, with anxiety: Secondary | ICD-10-CM | POA: Diagnosis not present

## 2022-08-10 DIAGNOSIS — F32A Depression, unspecified: Secondary | ICD-10-CM | POA: Diagnosis not present

## 2022-08-10 DIAGNOSIS — F03918 Unspecified dementia, unspecified severity, with other behavioral disturbance: Secondary | ICD-10-CM | POA: Diagnosis not present

## 2022-08-10 DIAGNOSIS — F0393 Unspecified dementia, unspecified severity, with mood disturbance: Secondary | ICD-10-CM | POA: Diagnosis not present

## 2022-08-13 DIAGNOSIS — F0393 Unspecified dementia, unspecified severity, with mood disturbance: Secondary | ICD-10-CM | POA: Diagnosis not present

## 2022-08-13 DIAGNOSIS — F03918 Unspecified dementia, unspecified severity, with other behavioral disturbance: Secondary | ICD-10-CM | POA: Diagnosis not present

## 2022-08-13 DIAGNOSIS — F32A Depression, unspecified: Secondary | ICD-10-CM | POA: Diagnosis not present

## 2022-08-13 DIAGNOSIS — F0394 Unspecified dementia, unspecified severity, with anxiety: Secondary | ICD-10-CM | POA: Diagnosis not present

## 2022-08-14 DIAGNOSIS — E039 Hypothyroidism, unspecified: Secondary | ICD-10-CM | POA: Diagnosis not present

## 2022-08-16 DIAGNOSIS — F32A Depression, unspecified: Secondary | ICD-10-CM | POA: Diagnosis not present

## 2022-08-16 DIAGNOSIS — F0394 Unspecified dementia, unspecified severity, with anxiety: Secondary | ICD-10-CM | POA: Diagnosis not present

## 2022-08-16 DIAGNOSIS — F03918 Unspecified dementia, unspecified severity, with other behavioral disturbance: Secondary | ICD-10-CM | POA: Diagnosis not present

## 2022-08-16 DIAGNOSIS — F0393 Unspecified dementia, unspecified severity, with mood disturbance: Secondary | ICD-10-CM | POA: Diagnosis not present

## 2022-08-21 DIAGNOSIS — H353221 Exudative age-related macular degeneration, left eye, with active choroidal neovascularization: Secondary | ICD-10-CM | POA: Diagnosis not present

## 2022-08-22 DIAGNOSIS — F03918 Unspecified dementia, unspecified severity, with other behavioral disturbance: Secondary | ICD-10-CM | POA: Diagnosis not present

## 2022-08-22 DIAGNOSIS — F32A Depression, unspecified: Secondary | ICD-10-CM | POA: Diagnosis not present

## 2022-08-22 DIAGNOSIS — F0394 Unspecified dementia, unspecified severity, with anxiety: Secondary | ICD-10-CM | POA: Diagnosis not present

## 2022-08-22 DIAGNOSIS — I10 Essential (primary) hypertension: Secondary | ICD-10-CM | POA: Diagnosis not present

## 2022-08-22 DIAGNOSIS — F0393 Unspecified dementia, unspecified severity, with mood disturbance: Secondary | ICD-10-CM | POA: Diagnosis not present

## 2022-08-22 DIAGNOSIS — E039 Hypothyroidism, unspecified: Secondary | ICD-10-CM | POA: Diagnosis not present

## 2022-08-27 DIAGNOSIS — F32A Depression, unspecified: Secondary | ICD-10-CM | POA: Diagnosis not present

## 2022-08-27 DIAGNOSIS — F0393 Unspecified dementia, unspecified severity, with mood disturbance: Secondary | ICD-10-CM | POA: Diagnosis not present

## 2022-08-27 DIAGNOSIS — F03918 Unspecified dementia, unspecified severity, with other behavioral disturbance: Secondary | ICD-10-CM | POA: Diagnosis not present

## 2022-08-27 DIAGNOSIS — F0394 Unspecified dementia, unspecified severity, with anxiety: Secondary | ICD-10-CM | POA: Diagnosis not present

## 2022-08-31 DIAGNOSIS — F0394 Unspecified dementia, unspecified severity, with anxiety: Secondary | ICD-10-CM | POA: Diagnosis not present

## 2022-08-31 DIAGNOSIS — F32A Depression, unspecified: Secondary | ICD-10-CM | POA: Diagnosis not present

## 2022-08-31 DIAGNOSIS — F03918 Unspecified dementia, unspecified severity, with other behavioral disturbance: Secondary | ICD-10-CM | POA: Diagnosis not present

## 2022-08-31 DIAGNOSIS — F0393 Unspecified dementia, unspecified severity, with mood disturbance: Secondary | ICD-10-CM | POA: Diagnosis not present

## 2022-09-04 DIAGNOSIS — H353211 Exudative age-related macular degeneration, right eye, with active choroidal neovascularization: Secondary | ICD-10-CM | POA: Diagnosis not present

## 2022-09-05 DIAGNOSIS — F0394 Unspecified dementia, unspecified severity, with anxiety: Secondary | ICD-10-CM | POA: Diagnosis not present

## 2022-09-05 DIAGNOSIS — F32A Depression, unspecified: Secondary | ICD-10-CM | POA: Diagnosis not present

## 2022-09-05 DIAGNOSIS — F03918 Unspecified dementia, unspecified severity, with other behavioral disturbance: Secondary | ICD-10-CM | POA: Diagnosis not present

## 2022-09-05 DIAGNOSIS — F0393 Unspecified dementia, unspecified severity, with mood disturbance: Secondary | ICD-10-CM | POA: Diagnosis not present

## 2022-09-12 DIAGNOSIS — F32A Depression, unspecified: Secondary | ICD-10-CM | POA: Diagnosis not present

## 2022-09-12 DIAGNOSIS — I10 Essential (primary) hypertension: Secondary | ICD-10-CM | POA: Diagnosis not present

## 2022-09-12 DIAGNOSIS — Z7189 Other specified counseling: Secondary | ICD-10-CM | POA: Diagnosis not present

## 2022-09-12 DIAGNOSIS — E039 Hypothyroidism, unspecified: Secondary | ICD-10-CM | POA: Diagnosis not present

## 2022-09-24 DIAGNOSIS — H353221 Exudative age-related macular degeneration, left eye, with active choroidal neovascularization: Secondary | ICD-10-CM | POA: Diagnosis not present

## 2022-10-04 DIAGNOSIS — E119 Type 2 diabetes mellitus without complications: Secondary | ICD-10-CM | POA: Diagnosis not present

## 2022-10-09 DIAGNOSIS — K8689 Other specified diseases of pancreas: Secondary | ICD-10-CM | POA: Diagnosis not present

## 2022-10-09 DIAGNOSIS — H409 Unspecified glaucoma: Secondary | ICD-10-CM | POA: Diagnosis not present

## 2022-10-09 DIAGNOSIS — I129 Hypertensive chronic kidney disease with stage 1 through stage 4 chronic kidney disease, or unspecified chronic kidney disease: Secondary | ICD-10-CM | POA: Diagnosis not present

## 2022-10-09 DIAGNOSIS — L309 Dermatitis, unspecified: Secondary | ICD-10-CM | POA: Diagnosis not present

## 2022-10-09 DIAGNOSIS — Z853 Personal history of malignant neoplasm of breast: Secondary | ICD-10-CM | POA: Diagnosis not present

## 2022-10-09 DIAGNOSIS — Z483 Aftercare following surgery for neoplasm: Secondary | ICD-10-CM | POA: Diagnosis not present

## 2022-10-09 DIAGNOSIS — E785 Hyperlipidemia, unspecified: Secondary | ICD-10-CM | POA: Diagnosis not present

## 2022-10-09 DIAGNOSIS — F32A Depression, unspecified: Secondary | ICD-10-CM | POA: Diagnosis not present

## 2022-10-09 DIAGNOSIS — H353211 Exudative age-related macular degeneration, right eye, with active choroidal neovascularization: Secondary | ICD-10-CM | POA: Diagnosis not present

## 2022-10-09 DIAGNOSIS — E559 Vitamin D deficiency, unspecified: Secondary | ICD-10-CM | POA: Diagnosis not present

## 2022-10-09 DIAGNOSIS — N182 Chronic kidney disease, stage 2 (mild): Secondary | ICD-10-CM | POA: Diagnosis not present

## 2022-10-09 DIAGNOSIS — M1990 Unspecified osteoarthritis, unspecified site: Secondary | ICD-10-CM | POA: Diagnosis not present

## 2022-10-09 DIAGNOSIS — F02B3 Dementia in other diseases classified elsewhere, moderate, with mood disturbance: Secondary | ICD-10-CM | POA: Diagnosis not present

## 2022-10-09 DIAGNOSIS — E039 Hypothyroidism, unspecified: Secondary | ICD-10-CM | POA: Diagnosis not present

## 2022-10-16 IMAGING — CT CT HEAD W/O CM
3 series · 15 of 45 positions shown, 18 images · non-contrast
Comparison: None

CLINICAL DATA: Patient passed out and fell in the kitchen.
Generalized weakness for several weeks.



[Series 2: head wo · axial · 0.42mm/px · z∈[-111,+4]mm · 9 of 28 slices shown, 12 images]
[im 3/28  brain]
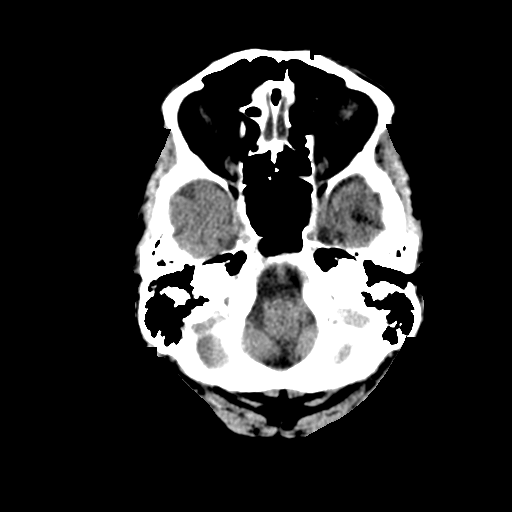
[im 3/28  bone]
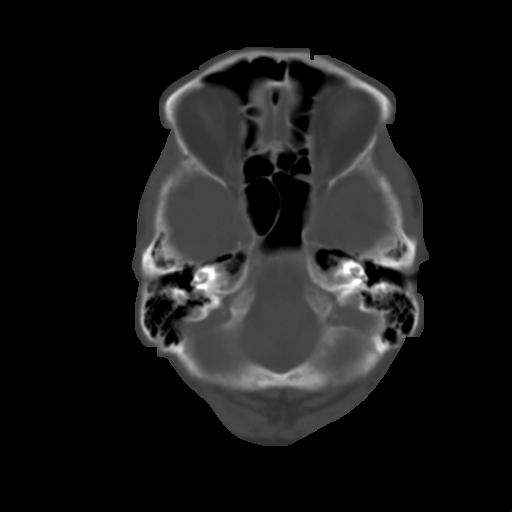
[im 6/28  brain]
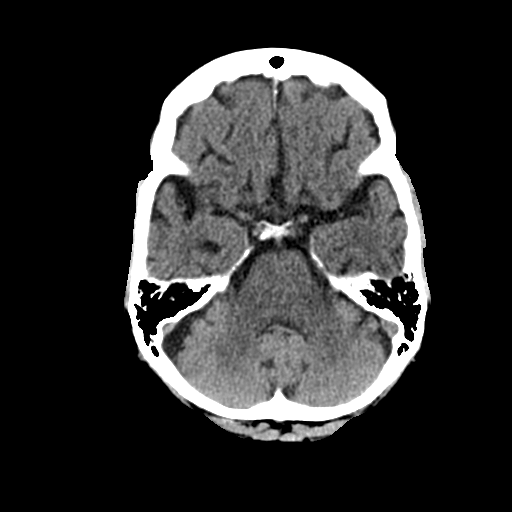
[im 9/28  brain]
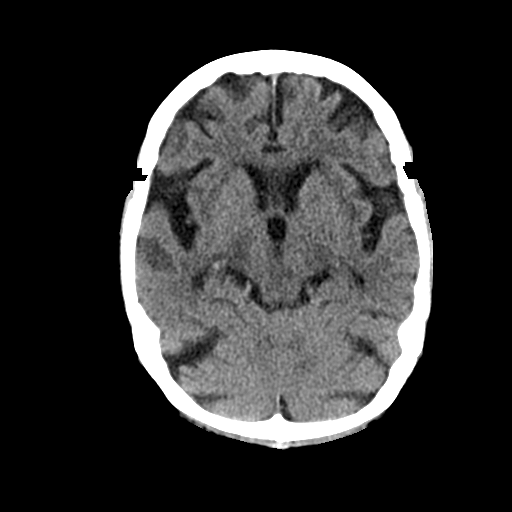
[im 12/28  brain]
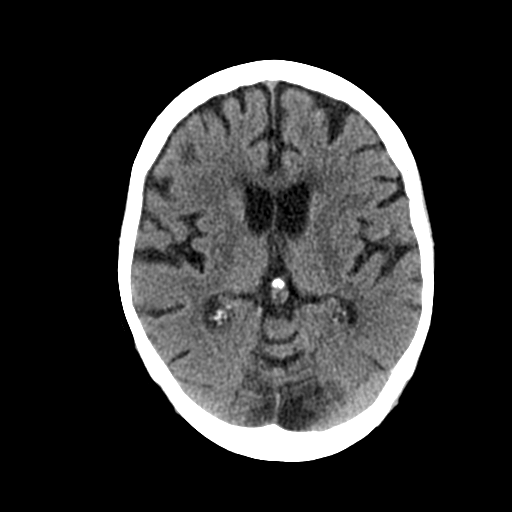
[im 15/28  brain]
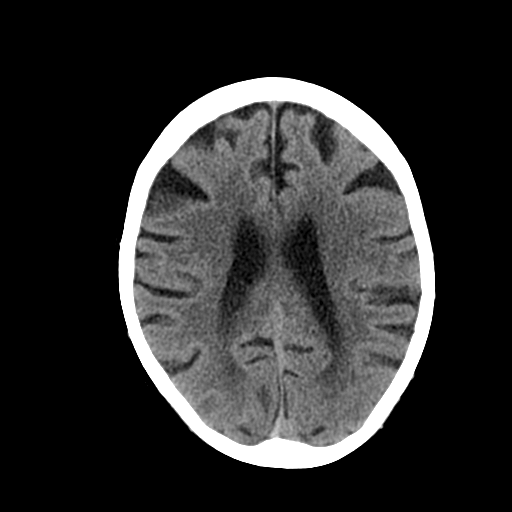
[im 15/28  bone]
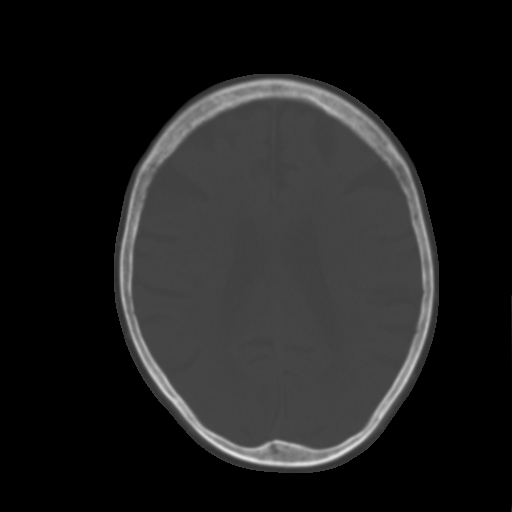
[im 17/28  brain]
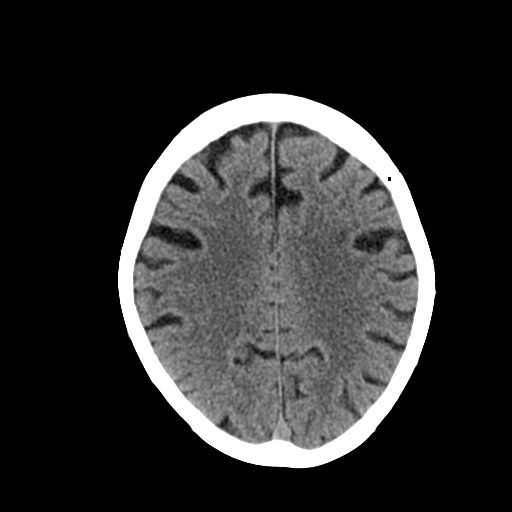
[im 20/28  brain]
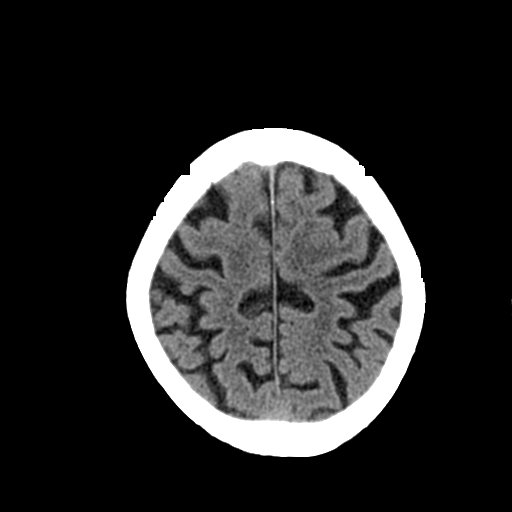
[im 23/28  brain]
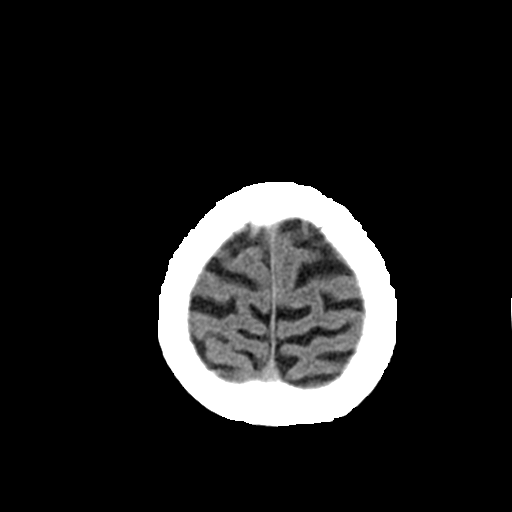
[im 26/28  brain]
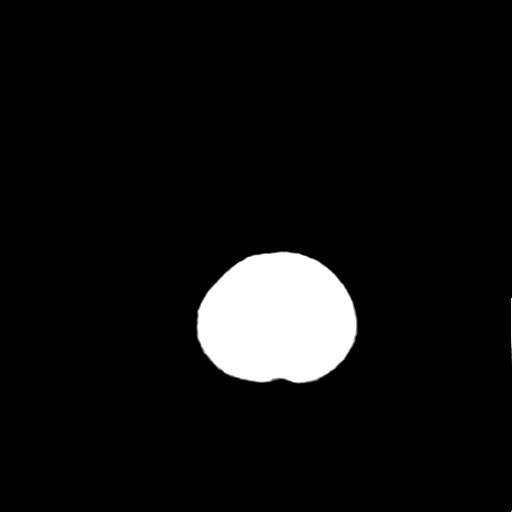
[im 26/28  bone]
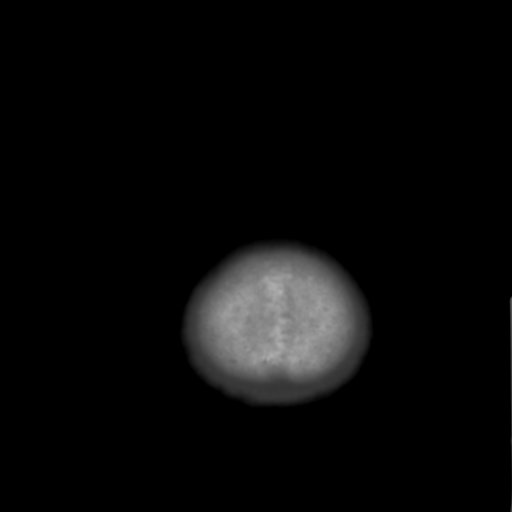

[Series 5: sagittal soft tissue · sagittal · 0.29mm/px · 3 of 54 slices shown]
[im 18/54  brain]
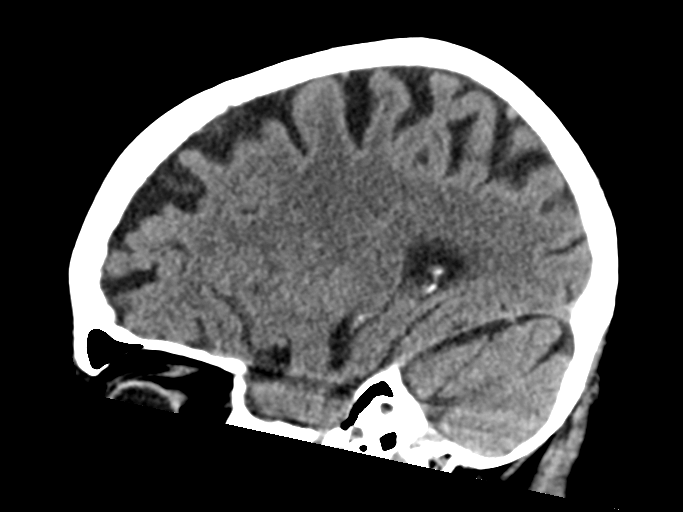
[im 27/54  brain]
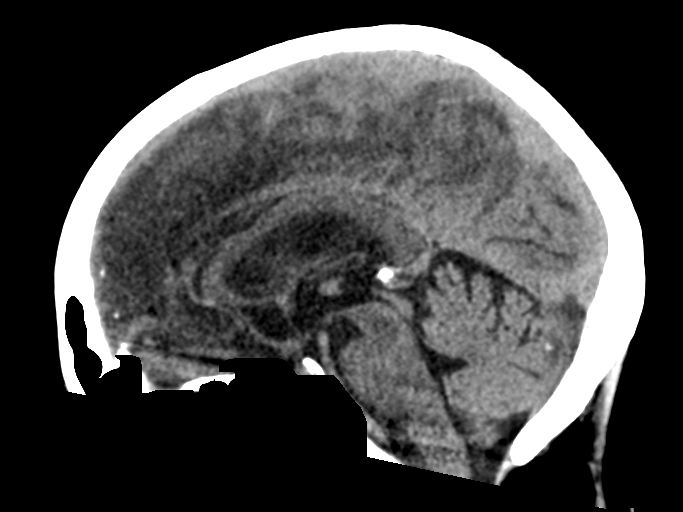
[im 36/54  brain]
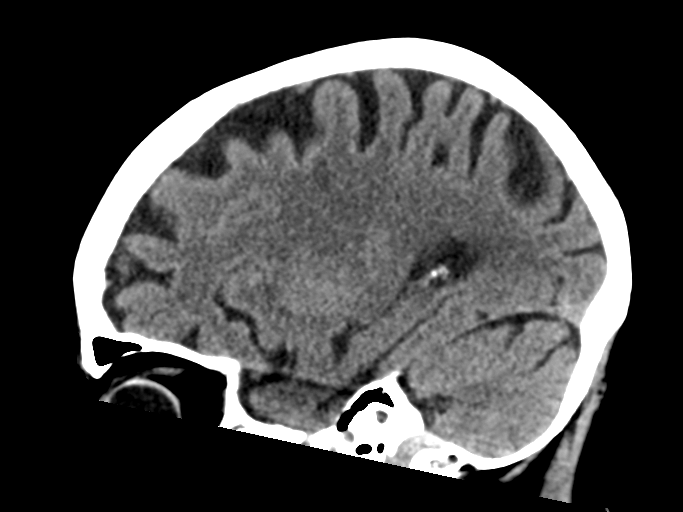

[Series 6: coronal soft tissue · coronal · 0.28mm/px · 3 of 63 slices shown]
[im 21/63  brain]
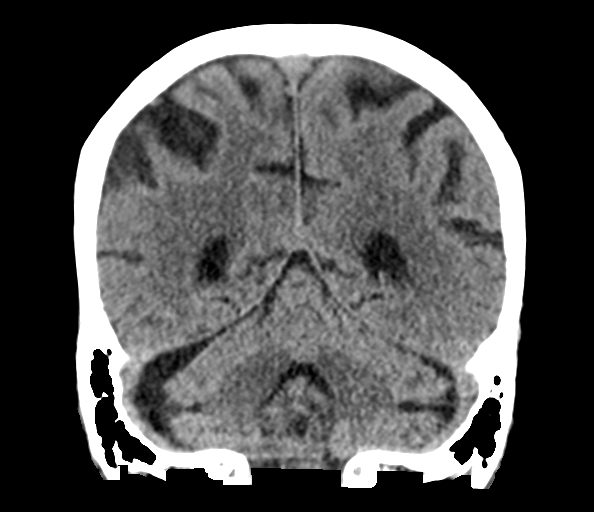
[im 28/63  brain]
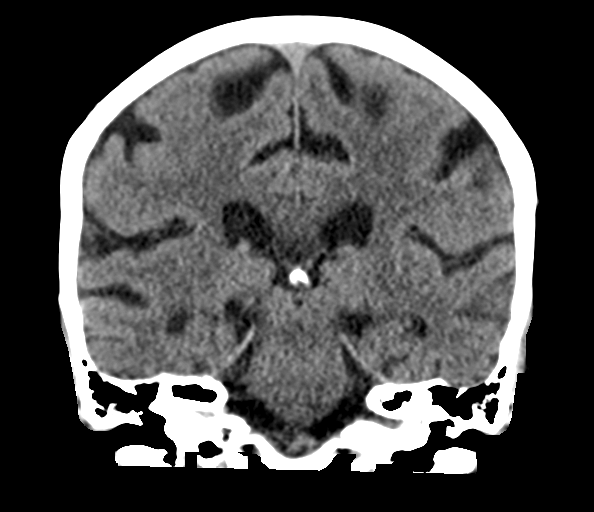
[im 35/63  brain]
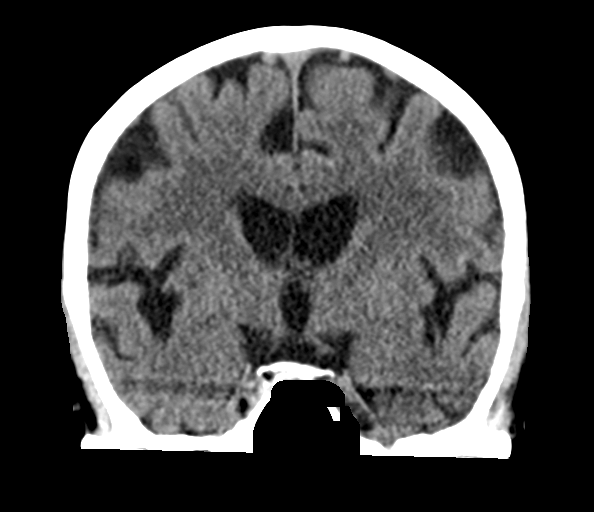

[15 of 45 positions shown; findings below may reference images not displayed]

FINDINGS: Brain: No evidence of acute infarction, hemorrhage, hydrocephalus,
extra-axial collection or mass lesion/mass effect. Cerebral atrophy
and chronic microvascular ischemic changes of the white matter.

Vascular: No hyperdense vessel or unexpected calcification.

Skull: Normal. Negative for fracture or focal lesion.

Sinuses/Orbits: No acute finding.

Other: None.
IMPRESSION: 1.  No acute intracranial abnormality.

2. Cerebral atrophy and chronic microvascular ischemic changes of
the white matter.

## 2022-10-30 DIAGNOSIS — H353221 Exudative age-related macular degeneration, left eye, with active choroidal neovascularization: Secondary | ICD-10-CM | POA: Diagnosis not present

## 2022-11-13 DIAGNOSIS — H353211 Exudative age-related macular degeneration, right eye, with active choroidal neovascularization: Secondary | ICD-10-CM | POA: Diagnosis not present

## 2022-11-16 DIAGNOSIS — Z8719 Personal history of other diseases of the digestive system: Secondary | ICD-10-CM | POA: Diagnosis not present

## 2022-11-16 DIAGNOSIS — B09 Unspecified viral infection characterized by skin and mucous membrane lesions: Secondary | ICD-10-CM | POA: Diagnosis not present

## 2022-12-11 DIAGNOSIS — H353221 Exudative age-related macular degeneration, left eye, with active choroidal neovascularization: Secondary | ICD-10-CM | POA: Diagnosis not present

## 2022-12-18 DIAGNOSIS — H353211 Exudative age-related macular degeneration, right eye, with active choroidal neovascularization: Secondary | ICD-10-CM | POA: Diagnosis not present

## 2022-12-27 DIAGNOSIS — E039 Hypothyroidism, unspecified: Secondary | ICD-10-CM | POA: Diagnosis not present

## 2022-12-27 DIAGNOSIS — C50911 Malignant neoplasm of unspecified site of right female breast: Secondary | ICD-10-CM | POA: Diagnosis not present

## 2022-12-27 DIAGNOSIS — N189 Chronic kidney disease, unspecified: Secondary | ICD-10-CM | POA: Diagnosis not present

## 2022-12-27 DIAGNOSIS — Z789 Other specified health status: Secondary | ICD-10-CM | POA: Diagnosis not present

## 2022-12-27 DIAGNOSIS — E559 Vitamin D deficiency, unspecified: Secondary | ICD-10-CM | POA: Diagnosis not present

## 2022-12-27 DIAGNOSIS — Z9189 Other specified personal risk factors, not elsewhere classified: Secondary | ICD-10-CM | POA: Diagnosis not present

## 2022-12-27 DIAGNOSIS — I129 Hypertensive chronic kidney disease with stage 1 through stage 4 chronic kidney disease, or unspecified chronic kidney disease: Secondary | ICD-10-CM | POA: Diagnosis not present

## 2023-01-08 DIAGNOSIS — E559 Vitamin D deficiency, unspecified: Secondary | ICD-10-CM | POA: Diagnosis not present

## 2023-01-08 DIAGNOSIS — R946 Abnormal results of thyroid function studies: Secondary | ICD-10-CM | POA: Diagnosis not present

## 2023-01-15 DIAGNOSIS — E559 Vitamin D deficiency, unspecified: Secondary | ICD-10-CM | POA: Diagnosis not present

## 2023-01-15 DIAGNOSIS — Z7189 Other specified counseling: Secondary | ICD-10-CM | POA: Diagnosis not present

## 2023-01-15 DIAGNOSIS — E039 Hypothyroidism, unspecified: Secondary | ICD-10-CM | POA: Diagnosis not present

## 2023-01-15 DIAGNOSIS — H353221 Exudative age-related macular degeneration, left eye, with active choroidal neovascularization: Secondary | ICD-10-CM | POA: Diagnosis not present

## 2023-01-22 DIAGNOSIS — H353211 Exudative age-related macular degeneration, right eye, with active choroidal neovascularization: Secondary | ICD-10-CM | POA: Diagnosis not present

## 2023-02-18 ENCOUNTER — Emergency Department (HOSPITAL_COMMUNITY): Payer: Medicare Other

## 2023-02-18 ENCOUNTER — Emergency Department (HOSPITAL_COMMUNITY)
Admission: EM | Admit: 2023-02-18 | Discharge: 2023-02-18 | Disposition: A | Discharge status: Skilled Nursing Facility | Payer: Medicare Other | Attending: Emergency Medicine | Admitting: Emergency Medicine

## 2023-02-18 ENCOUNTER — Other Ambulatory Visit: Payer: Self-pay

## 2023-02-18 ENCOUNTER — Encounter (HOSPITAL_COMMUNITY): Payer: Self-pay | Admitting: Emergency Medicine

## 2023-02-18 DIAGNOSIS — F0390 Unspecified dementia without behavioral disturbance: Secondary | ICD-10-CM | POA: Insufficient documentation

## 2023-02-18 DIAGNOSIS — Z853 Personal history of malignant neoplasm of breast: Secondary | ICD-10-CM | POA: Diagnosis not present

## 2023-02-18 DIAGNOSIS — S51012A Laceration without foreign body of left elbow, initial encounter: Secondary | ICD-10-CM | POA: Diagnosis not present

## 2023-02-18 DIAGNOSIS — N3000 Acute cystitis without hematuria: Secondary | ICD-10-CM | POA: Diagnosis not present

## 2023-02-18 DIAGNOSIS — S59902A Unspecified injury of left elbow, initial encounter: Secondary | ICD-10-CM | POA: Diagnosis present

## 2023-02-18 DIAGNOSIS — S51812A Laceration without foreign body of left forearm, initial encounter: Secondary | ICD-10-CM | POA: Insufficient documentation

## 2023-02-18 DIAGNOSIS — M47812 Spondylosis without myelopathy or radiculopathy, cervical region: Secondary | ICD-10-CM | POA: Diagnosis not present

## 2023-02-18 DIAGNOSIS — Z743 Need for continuous supervision: Secondary | ICD-10-CM | POA: Diagnosis not present

## 2023-02-18 DIAGNOSIS — S199XXA Unspecified injury of neck, initial encounter: Secondary | ICD-10-CM | POA: Diagnosis not present

## 2023-02-18 DIAGNOSIS — K8689 Other specified diseases of pancreas: Secondary | ICD-10-CM | POA: Diagnosis not present

## 2023-02-18 DIAGNOSIS — M47811 Spondylosis without myelopathy or radiculopathy, occipito-atlanto-axial region: Secondary | ICD-10-CM | POA: Diagnosis not present

## 2023-02-18 DIAGNOSIS — R58 Hemorrhage, not elsewhere classified: Secondary | ICD-10-CM | POA: Diagnosis not present

## 2023-02-18 DIAGNOSIS — R7309 Other abnormal glucose: Secondary | ICD-10-CM | POA: Insufficient documentation

## 2023-02-18 DIAGNOSIS — N281 Cyst of kidney, acquired: Secondary | ICD-10-CM | POA: Diagnosis not present

## 2023-02-18 DIAGNOSIS — W19XXXA Unspecified fall, initial encounter: Secondary | ICD-10-CM | POA: Diagnosis not present

## 2023-02-18 DIAGNOSIS — S0990XA Unspecified injury of head, initial encounter: Secondary | ICD-10-CM | POA: Diagnosis not present

## 2023-02-18 DIAGNOSIS — M4802 Spinal stenosis, cervical region: Secondary | ICD-10-CM | POA: Diagnosis not present

## 2023-02-18 DIAGNOSIS — I10 Essential (primary) hypertension: Secondary | ICD-10-CM | POA: Diagnosis not present

## 2023-02-18 DIAGNOSIS — K5750 Diverticulosis of both small and large intestine without perforation or abscess without bleeding: Secondary | ICD-10-CM | POA: Diagnosis not present

## 2023-02-18 DIAGNOSIS — E039 Hypothyroidism, unspecified: Secondary | ICD-10-CM | POA: Diagnosis not present

## 2023-02-18 DIAGNOSIS — Z91040 Latex allergy status: Secondary | ICD-10-CM | POA: Diagnosis not present

## 2023-02-18 DIAGNOSIS — Y92009 Unspecified place in unspecified non-institutional (private) residence as the place of occurrence of the external cause: Secondary | ICD-10-CM | POA: Diagnosis not present

## 2023-02-18 DIAGNOSIS — R9431 Abnormal electrocardiogram [ECG] [EKG]: Secondary | ICD-10-CM | POA: Diagnosis not present

## 2023-02-18 DIAGNOSIS — K654 Sclerosing mesenteritis: Secondary | ICD-10-CM | POA: Diagnosis not present

## 2023-02-18 DIAGNOSIS — I672 Cerebral atherosclerosis: Secondary | ICD-10-CM | POA: Diagnosis not present

## 2023-02-18 LAB — URINALYSIS, ROUTINE W REFLEX MICROSCOPIC
Bilirubin Urine: NEGATIVE
Glucose, UA: NEGATIVE mg/dL
Ketones, ur: NEGATIVE mg/dL
Nitrite: POSITIVE — AB
Protein, ur: 30 mg/dL — AB
Specific Gravity, Urine: 1.023 (ref 1.005–1.030)
Trans Epithel, UA: <1
pH: 6 (ref 5.0–8.0)

## 2023-02-18 LAB — CBC WITH DIFFERENTIAL/PLATELET
Abs Immature Granulocytes: 0.01 10*3/uL (ref 0.00–0.07)
Basophils Absolute: 0 10*3/uL (ref 0.0–0.1)
Basophils Relative: 0 %
Eosinophils Absolute: 0.1 10*3/uL (ref 0.0–0.5)
Eosinophils Relative: 2 %
HCT: 37.2 % (ref 36.0–46.0)
Hemoglobin: 12.9 g/dL (ref 12.0–15.0)
Immature Granulocytes: 0 %
Lymphocytes Relative: 30 %
Lymphs Abs: 1.7 10*3/uL (ref 0.7–4.0)
MCH: 33 pg (ref 26.0–34.0)
MCHC: 34.7 g/dL (ref 30.0–36.0)
MCV: 95.1 fL (ref 80.0–100.0)
Monocytes Absolute: 0.5 10*3/uL (ref 0.1–1.0)
Monocytes Relative: 8 %
Neutro Abs: 3.5 10*3/uL (ref 1.7–7.7)
Neutrophils Relative %: 60 %
Platelets: 194 10*3/uL (ref 150–400)
RBC: 3.91 MIL/uL (ref 3.87–5.11)
RDW: 12.7 % (ref 11.5–15.5)
WBC: 5.8 10*3/uL (ref 4.0–10.5)
nRBC: 0 % (ref 0.0–0.2)

## 2023-02-18 LAB — COMPREHENSIVE METABOLIC PANEL
ALT: 18 U/L (ref 0–44)
AST: 24 U/L (ref 15–41)
Albumin: 3.5 g/dL (ref 3.5–5.0)
Alkaline Phosphatase: 63 U/L (ref 38–126)
Anion gap: 8 (ref 5–15)
BUN: 12 mg/dL (ref 8–23)
CO2: 27 mmol/L (ref 22–32)
Calcium: 8.7 mg/dL — ABNORMAL LOW (ref 8.9–10.3)
Chloride: 102 mmol/L (ref 98–111)
Creatinine, Ser: 0.63 mg/dL (ref 0.44–1.00)
GFR, Estimated: >60 mL/min (ref >60)
Glucose, Bld: 137 mg/dL — ABNORMAL HIGH (ref 70–99)
Potassium: 3.7 mmol/L (ref 3.5–5.1)
Sodium: 137 mmol/L (ref 135–145)
Total Bilirubin: 0.8 mg/dL (ref <1.2)
Total Protein: 6.6 g/dL (ref 6.5–8.1)

## 2023-02-18 LAB — LIPASE, BLOOD: Lipase: 22 U/L (ref 11–51)

## 2023-02-18 LAB — CBG MONITORING, ED: Glucose-Capillary: 145 mg/dL — ABNORMAL HIGH (ref 70–99)

## 2023-02-18 MED ORDER — ONDANSETRON 4 MG PO TBDP: 4.0000 mg | ORAL_TABLET | Freq: Three times a day (TID) | ORAL | 0 refills | Status: DC | PRN

## 2023-02-18 MED ORDER — CEPHALEXIN 250 MG PO CAPS: 250.0000 mg | ORAL_CAPSULE | Freq: Four times a day (QID) | ORAL | 0 refills | Status: DC

## 2023-02-18 MED ORDER — SODIUM CHLORIDE 0.9 % IV SOLN
1.0000 g | Freq: Once | INTRAVENOUS | Status: AC
Start: 2023-02-18 — End: 2023-02-18
  Administered 2023-02-18: 1 g via INTRAVENOUS
  Filled 2023-02-18: qty 10

## 2023-02-18 MED ORDER — ONDANSETRON 4 MG PO TBDP: 4.0000 mg | ORAL_TABLET | Freq: Three times a day (TID) | ORAL | 0 refills | Status: AC | PRN

## 2023-02-18 MED ORDER — IOHEXOL 300 MG/ML  SOLN
100.0000 mL | Freq: Once | INTRAMUSCULAR | Status: AC | PRN
  Administered 2023-02-18: 100 mL via INTRAVENOUS

## 2023-02-18 MED ORDER — CEPHALEXIN 250 MG PO CAPS: 250.0000 mg | ORAL_CAPSULE | Freq: Four times a day (QID) | ORAL | 0 refills | Status: AC

## 2023-02-18 NOTE — ED Notes (Signed)
Son and daughter in law taking pt to [place] [facility] [place]

## 2023-02-18 NOTE — ED Notes (Signed)
Patient states she vomited twice this morning before getting out of bed. She fell shortly after getting out of bed.

## 2023-02-18 NOTE — ED Notes (Signed)
Lipase lab being added to previous samples.

## 2023-02-18 NOTE — ED Provider Notes (Signed)
[place] EMERGENCY DEPARTMENT AT [facility] Provider Note   CSN: [id number] Arrival date & time: 02/18/23  0700     History  Chief Complaint  Patient presents with   Kristin Bryant is a 89 y.o. female with PMH as listed below who presents BIBA from [place] [facility] [place] for unwitnessed fall, SNF staff heard thump from other room and found patient on the ground. Patient states she was trying to get up from her bed to use the restroom. She is unsure if she hit her head or lost consciousness. She endorses mild headache, denies neck pain. She has skin tear to left forearm. She previously endorsed pain to bilateral knees but now denies them. Pt A&Ox2, hx of dementia. Son at bedside states that patient has had a couple episodes of nausea/vomiting as well as NB diarrhea for a couple of days. She denies nausea, abdominal pain, f/c, CP, urinary sxs. Per chart review patient has history of recurrent falls most likely due to orthostatic hypotension documented from last year. Daughter in law also reports that since they've been in the ED, she's noticed patient urinating frequently.  Past Medical History:  Diagnosis Date   Breast cancer (HCC) 12/2018   right   Chronic pancreatitis (HCC) 01/18/2022   Elevated cholesterol    Hypertension    Hypothyroidism    Osteoporosis    Thoracic compression fracture (HCC) 01/18/2022       Home Medications Prior to Admission medications   Medication Sig Start Date End Date Taking? Authorizing Provider  acetaminophen (TYLENOL) 500 MG tablet Take 1,000 mg by mouth every 8 (eight) hours.   Yes [provider]  bisacodyl (DULCOLAX) 5 MG EC tablet Take 5 mg by mouth daily as needed for moderate constipation.   Yes [provider]  busPIRone (BUSPAR) 5 MG tablet Take 5 mg by mouth 3 (three) times daily. 01/31/23  Yes [provider]  Cholecalciferol (VITAMIN D-3) 125 MCG (5000 UT) TABS Take 5,000 Units by mouth daily.    Yes [provider]  latanoprost (XALATAN) 0.005 % ophthalmic solution Place 1 drop into both eyes at bedtime.   Yes [provider]  levothyroxine (SYNTHROID) 112 MCG tablet Take 112 mcg by mouth daily. 12/08/21  Yes [provider]  lidocaine 4 % Place 2 patches onto the skin in the morning. To right ankle, lower back.   Yes [provider]  lipase/protease/amylase 24000-76000 units CPEP Take 1 capsule (24,000 Units total) by mouth 3 (three) times daily before meals. 01/20/22  Yes Standley Brooking, MD  Multiple Vitamins-Minerals (PRESERVISION AREDS 2 PO) Take 1 capsule by mouth daily.   Yes [provider]  polyethylene glycol powder (GLYCOLAX/MIRALAX) 17 GM/SCOOP powder Take 17 g by mouth in the morning and at bedtime. 01/20/22  Yes Standley Brooking, MD  QUEtiapine (SEROQUEL) 25 MG tablet Take 25 mg by mouth at bedtime. 12/08/21  Yes [provider]  senna-docusate (SENOKOT-S) 8.6-50 MG tablet Take 1 tablet by mouth daily.   Yes [provider]  sertraline (ZOLOFT) 25 MG tablet Take 25 mg by mouth daily. 12/08/21  Yes [provider]  timolol (TIMOPTIC) 0.5 % ophthalmic solution Place 1 drop into both eyes 2 (two) times daily.   Yes [provider]  cephALEXin (KEFLEX) 250 MG capsule Take 1 capsule (250 mg total) by mouth 4 (four) times daily for 7 days. 02/18/23 02/25/23  Loetta Rough, MD  ondansetron (ZOFRAN-ODT) 4  MG disintegrating tablet Take 1 tablet (4 mg total) by mouth every 8 (eight) hours as needed. 02/18/23   Loetta Rough, MD      Allergies    Aspirin, Benzonatate, Celexa [citalopram], Chlorzoxazone, Citalopram hydrobromide, Cortisone, Diclofenac-misoprostol, Escitalopram oxalate, Guaifenesin, Guaifenesin & derivatives, Hydrocodone, Ipratropium bromide, [place] valley herb [convallariae majalis], Mirtazapine, Naproxen, Neosporin [bacitracin-polymyxin b], Prednisone, Pseudoephedrine, Ranitidine,  Ranitidine hcl, Sudafed [pseudoephedrine hcl], Sulfa antibiotics, Epinephrine, and Latex    Review of Systems   Review of Systems A 10 point review of systems was performed and is negative unless otherwise reported in HPI.  Physical Exam Updated Vital Signs BP 138/64 (BP Location: Right Arm)   Pulse 75   Temp 98 F (36.7 C)   Resp 18   Ht 5\' 2"  (1.575 m)   Wt 64 kg   SpO2 97%   BMI 25.81 kg/m  Physical Exam General: Normal appearing female, lying in bed.  HEENT: PERRLA, Sclera anicteric, MMM, trachea midline. NCAT. No skull depressions/deformities. No facial trauma noted. Cardiology: RRR, no murmurs/rubs/gallops. No chest wall TTP, deformities, bruises, crepitus.  Resp: Normal respiratory rate and effort. CTAB, no wheezes, rhonchi, crackles.  Abd: Soft, non-tender. Mild diffuse abdominal distension. No rebound tenderness or guarding.  GU: Deferred. MSK: Two Skin tears, hemostatic, to L forearm. No peripheral edema or signs of trauma. Extremities without deformity or TTP. No cyanosis or clubbing. Skin: warm, dry. No rashes or lesions. Back: No CVA tenderness. No midline C, T, or L spine TTP, deformities/stepoffs.  Neuro: A&Ox2, acting at baseline per son at bedside, CNs II-XII grossly intact. MAEs. Sensation grossly intact.  Psych: Normal mood and affect.   ED Results / Procedures / Treatments   Labs (all labs ordered are listed, but only abnormal results are displayed) Labs Reviewed  COMPREHENSIVE METABOLIC PANEL - Abnormal; Notable for the following components:      Result Value   Glucose, Bld 137 (*)    Calcium 8.7 (*)    All other components within normal limits  URINALYSIS, ROUTINE W REFLEX MICROSCOPIC - Abnormal; Notable for the following components:   APPearance CLOUDY (*)    Hgb urine dipstick MODERATE (*)    Protein, ur 30 (*)    Nitrite POSITIVE (*)    Leukocytes,Ua LARGE (*)    Bacteria, UA MANY (*)    Non Squamous Epithelial 0-5 (*)    All other components  within normal limits  CBG MONITORING, ED - Abnormal; Notable for the following components:   Glucose-Capillary 145 (*)    All other components within normal limits  CBC WITH DIFFERENTIAL/PLATELET  LIPASE, BLOOD    EKG EKG Interpretation Date/Time:  Monday February 18 2023 07:37:05 EST Ventricular Rate:  75 PR Interval:  129 QRS Duration:  74 QT Interval:  392 QTC Calculation: 438 R Axis:   -45  Text Interpretation: Sinus  rhythm Atrial premature complex Left anterior fascicular block Low voltage, precordial leads Confirmed by Vivi Barrack [phone]) on 02/18/2023 8:40:55 AM  Radiology CT Cervical Spine Wo Contrast  Result Date: 02/18/2023 CLINICAL DATA:  Neck trauma. EXAM: CT CERVICAL SPINE WITHOUT CONTRAST TECHNIQUE: Multidetector CT imaging of the cervical spine was performed without intravenous contrast. Multiplanar CT image reconstructions were also generated. RADIATION DOSE REDUCTION: This exam was performed according to the departmental dose-optimization program which includes automated exposure control, adjustment of the mA and/or kV according to patient size and/or use of iterative reconstruction technique. COMPARISON:  Cervical spine CT 01/12/2022. FINDINGS: Alignment: Unchanged mild  degenerative reversal of the normal cervical lordosis. No traumatic malalignment. Skull base and vertebrae: No acute fracture. Normal craniocervical junction. No suspicious bone lesions. Moderate degenerative changes of the C1-2 articulation unchanged degenerative pannus resulting in mild spinal canal stenosis. Soft tissues and spinal canal: No prevertebral fluid or swelling. No visible canal hematoma. Disc levels: Multilevel cervical spondylosis, worst at C4-5, where there is at least moderate spinal canal stenosis. Upper chest: No acute findings. Other: Atherosclerotic calcifications of the carotid bulbs. IMPRESSION: 1. No acute fracture or traumatic malalignment of the cervical spine. 2. Multilevel  cervical spondylosis, worst at C4-5, where there is at least moderate spinal canal stenosis. Electronically Signed   By: Orvan Falconer M.D.   On: 02/18/2023 13:00   CT Head Wo Contrast  Result Date: 02/18/2023 CLINICAL DATA:  Head trauma, minor (Age >= 65y).  Unwitnessed fall. EXAM: CT HEAD WITHOUT CONTRAST TECHNIQUE: Contiguous axial images were obtained from the base of the skull through the vertex without intravenous contrast. RADIATION DOSE REDUCTION: This exam was performed according to the departmental dose-optimization program which includes automated exposure control, adjustment of the mA and/or kV according to patient size and/or use of iterative reconstruction technique. COMPARISON:  01/12/2022. FINDINGS: Brain: No evidence of acute infarction, hemorrhage, hydrocephalus, extra-axial collection or mass lesion/mass effect. There is bilateral periventricular hypodensity, which is non-specific but most likely seen in the settings of microvascular ischemic changes. Mild in extent. Otherwise normal appearance of brain parenchyma. Ventricles are normal. Cerebral volume is age appropriate. Vascular: No hyperdense vessel or unexpected calcification. Intracranial arteriosclerosis. Skull: Normal. Negative for fracture or focal lesion. Sinuses/Orbits: No acute finding. Mild mucoperiosteal thickening noted in the bilateral ethmoidal air cells. Other: Visualized mastoid air cells are unremarkable. No mastoid effusion. IMPRESSION: *No acute intracranial abnormality. Electronically Signed   By: Jules Schick M.D.   On: 02/18/2023 10:28   CT ABDOMEN PELVIS W CONTRAST  Result Date: 02/18/2023 CLINICAL DATA:  Bowel obstruction suspected. EXAM: CT ABDOMEN AND PELVIS WITH CONTRAST TECHNIQUE: Multidetector CT imaging of the abdomen and pelvis was performed using the standard protocol following bolus administration of intravenous contrast. RADIATION DOSE REDUCTION: This exam was performed according to the  departmental dose-optimization program which includes automated exposure control, adjustment of the mA and/or kV according to patient size and/or use of iterative reconstruction technique. CONTRAST:  OMNIPAQUE IOHEXOL 300 MG/ML  SOLN COMPARISON:  MRI abdomen and CT scan abdomen and pelvis from 01/18/2022. FINDINGS: Lower chest: There are patchy atelectatic changes in the visualized lung bases. No overt consolidation. No pleural effusion. The heart is normal in size. No pericardial effusion. Hepatobiliary: The liver is normal in size. Non-cirrhotic configuration. No suspicious mass. These is mild diffuse hepatic steatosis. No intrahepatic or extrahepatic bile duct dilation. No calcified gallstones. Normal gallbladder wall thickness. No pericholecystic inflammatory changes. Pancreas: Redemonstration of small/atrophic pancreas, essentially similar to the prior study. There is enlarging calculus, currently 7 x 13 mm in the pancreatic head/neck region with resultant upstream pancreatic atrophy and dilation of main pancreatic duct, progressed since the prior study. There also additional dystrophic calcifications in the pancreatic head/uncinate process, suggesting sequela of chronic pancreatitis. No suspicious pancreatic mass. No peripancreatic fat stranding. Spleen: Within normal limits. No focal lesion. Adrenals/Urinary Tract: Adrenal glands are unremarkable. No suspicious renal mass. There is a stable predominantly exophytic 3.3 x 5.9 cm cyst arising from the left kidney upper pole. No hydronephrosis. Bilateral extrarenal pelvis noted. No renal or ureteric calculi. Urinary bladder is partially distended. There  is mild irregular anterior bladder wall thickening measuring up to 7 mm. No perivesical fat stranding. No focal mass. Findings are nonspecific but may represent sequela of chronic cystitis. Correlate clinically and with urinalysis. No bladder calculus. Stomach/Bowel: No disproportionate dilation of the small  or large bowel loops. No evidence of abnormal bowel wall thickening or inflammatory changes. The appendix was not visualized; however there is no acute inflammatory process in the right lower quadrant. There are multiple diverticula throughout the colon, without imaging signs of diverticulitis. There is also an approximately 3.2 x 5.1 cm diverticulum arising from the proximal duodenum. Vascular/Lymphatic: No ascites or pneumoperitoneum. There is new mistiness in the mesentery in the mid abdomen, which is nonspecific but in appropriate clinical setting can be seen with sclerosing mesenteritis / mesenteric panniculitis. No abdominal or pelvic lymphadenopathy, by size criteria. No aneurysmal dilation of the major abdominal arteries. There are moderate peripheral atherosclerotic vascular calcifications of the aorta and its major branches. Reproductive: The uterus is surgically absent. No large adnexal mass. Other: There is a tiny fat containing umbilical hernia. The soft tissues and abdominal wall are otherwise unremarkable. Musculoskeletal: No suspicious osseous lesions. There are moderate multilevel degenerative changes in the visualized spine. There is moderate compression deformity of T12 vertebrae, slightly increased since the prior study. There is mild retropulsion of the posterosuperior vertebral body, similar to prior study, causing mild spinal canal narrowing. No new vertebral compression deformity seen. IMPRESSION: 1. No acute inflammatory process identified within the abdomen or pelvis. No bowel obstruction. 2. Mild interval progression of moderate compression deformity of T12 vertebral body. 3. Multiple other nonemergent observations, as described above. Electronically Signed   By: Jules Schick M.D.   On: 02/18/2023 10:25    Procedures .Marland KitchenLaceration Repair  Date/Time: 02/18/2023 3:55 PM  Performed by: Loetta Rough, MD Authorized by: Loetta Rough, MD   Consent:    Consent obtained:  Verbal    Consent given by:  Patient (son at bedside)   Risks discussed:  Infection, pain, poor wound healing and need for additional repair   Alternatives discussed:  No treatment Universal protocol:    Patient identity confirmed:  Verbally with patient Anesthesia:    Anesthesia method:  None Laceration details:    Location:  Shoulder/arm   Shoulder/arm location:  L elbow   Length (cm):  6   Depth (mm):  1 Exploration:    Hemostasis achieved with:  Direct pressure   Imaging outcome: foreign body not noted     Wound exploration: wound explored through full range of motion and entire depth of wound visualized     Wound extent: areolar tissue not violated, fascia not violated, no foreign body, no signs of injury, no nerve damage, no tendon damage, no underlying fracture and no vascular damage     Contaminated: no   Treatment:    Area cleansed with:  Saline   Amount of cleaning:  Standard   Irrigation solution:  Sterile saline   Irrigation volume:  1L Skin repair:    Repair method:  Steri-Strips   Number of Steri-Strips:  6 Approximation:    Approximation:  Close Repair type:    Repair type:  Simple Post-procedure details:    Dressing:  Sterile dressing   Procedure completion:  Tolerated     Medications Ordered in ED Medications  iohexol (OMNIPAQUE) 300 MG/ML solution 100 mL (100 mLs Intravenous Contrast Given 02/18/23 0913)  cefTRIAXone (ROCEPHIN) 1 g in sodium chloride 0.9 % 100 mL  IVPB (0 g Intravenous Stopped 02/18/23 1311)    ED Course/ Medical Decision Making/ A&P                          Medical Decision Making Amount and/or Complexity of Data Reviewed Labs: ordered. Decision-making details documented in ED Course. Radiology: ordered. Decision-making details documented in ED Course.  Risk Prescription drug management.    This patient presents to the ED for concern of fall at home, urinary frequency, this involves an extensive number of treatment options, and is a  complaint that carries with it a high risk of complications and morbidity.  I considered the following differential and admission for this acute, potentially life threatening condition.   MDM:    DDX for trauma includes but is not limited to:  -Head Injury such as skull fx or ICH -NCAT on exam but unknown if she hit her head or loss consciousness.  Staff did see her immediately after the fall and she was conscious but will obtain CT head given patient's age and report of trauma -Given report of recent nausea vomiting as well as diarrhea and abdominal distention on exam will obtain CT head and pelvis to evaluate for intra-abdominal infection such as diverticulitis or appendicitis or small bowel obstruction.  Also consider electrolyte derangements, hypo-/hyperglycemia, or infection such as UTI esp with urinary frequency reported that could be causing generalized weakness or her fall today.  She complains of no chest pain to indicate ACS and EKG does not demonstrate any arrhythmia or ischemic changes. -Vertebral injury -no midline C-spine tenderness ovation or neck pain reported, no pain with movement of the head or neck; patient is not oriented and she is not consistent with her story, will obtain C-spine CT to rule out injury -Fractures - no chest wall tenderness to indicate rib fracture, no extremity deformities dedicate extremity fracture.  She does have skin tear on the left forearm which will be cleaned with normal saline and repaired with Steri-Strips. Discussed possibility of flaps of tissue dying and falling off, and dicussed warning signs of infection.    Clinical Course as of 02/18/23 1558  Mon Feb 18, 2023  4166 Urinalysis, Routine w reflex microscopic -Urine, Clean Catch(!) UA with many squamous cells but does have positive nitrites in addition to leuks/bacteria so will treat for UTI.  [HN]  0943 WBC: 5.8 No leukocytosis  [HN]  0943 Comprehensive metabolic panel(!) Unremarkable in the  context of this patient's presentation  [HN]  1319 CT Cervical Spine Wo Contrast 1. No acute fracture or traumatic malalignment of the cervical spine. 2. Multilevel cervical spondylosis, worst at C4-5, where there is at least moderate spinal canal stenosis.   [HN]    Clinical Course User Index [HN] Loetta Rough, MD    Labs: I Ordered, and personally interpreted labs.  The pertinent results include:  those listed above  Imaging Studies ordered: I ordered imaging studies including CTH, CT abd pelvis I independently visualized and interpreted imaging. I agree with the radiologist interpretation  Additional history obtained from chart review, family at bedside, EMS.   Cardiac Monitoring: The patient was maintained on a cardiac monitor.  I personally viewed and interpreted the cardiac monitored which showed an underlying rhythm of: NSR  Reevaluation: After the interventions noted above, I reevaluated the patient and found that they have :improved  Social Determinants of Health: Lives at SNF  Disposition:  Pt given ceftriaxone here in ED and will  prescribe keflex. Will also prescribe zofran ODT for a few episodes of nausea/vomiting reported at facility, but no vomiting observed here in ED. Skin tears on L forearm repaired with 6 steri strips. Given wound instructions to paitent and family. Advised f/u with PCP within 1 week. DC w/ discharge instructions/return precautions. All questions answered to patient's satisfaction.    Co morbidities that complicate the patient evaluation  Past Medical History:  Diagnosis Date   Breast cancer (HCC) 12/2018   right   Chronic pancreatitis (HCC) 01/18/2022   Elevated cholesterol    Hypertension    Hypothyroidism    Osteoporosis    Thoracic compression fracture (HCC) 01/18/2022     Medicines Meds ordered this encounter  Medications   iohexol (OMNIPAQUE) 300 MG/ML solution 100 mL   cefTRIAXone (ROCEPHIN) 1 g in sodium chloride 0.9 %  100 mL IVPB    Order Specific Question:   Antibiotic Indication:    Answer:   UTI   DISCONTD: cephALEXin (KEFLEX) 250 MG capsule    Sig: Take 1 capsule (250 mg total) by mouth 4 (four) times daily for 7 days.    Dispense:  28 capsule    Refill:  0   DISCONTD: ondansetron (ZOFRAN-ODT) 4 MG disintegrating tablet    Sig: Take 1 tablet (4 mg total) by mouth every 8 (eight) hours as needed.    Dispense:  20 tablet    Refill:  0   cephALEXin (KEFLEX) 250 MG capsule    Sig: Take 1 capsule (250 mg total) by mouth 4 (four) times daily for 7 days.    Dispense:  28 capsule    Refill:  0   ondansetron (ZOFRAN-ODT) 4 MG disintegrating tablet    Sig: Take 1 tablet (4 mg total) by mouth every 8 (eight) hours as needed.    Dispense:  20 tablet    Refill:  0    I have reviewed the patients home medicines and have made adjustments as needed  Problem List / ED Course: Problem List Items Addressed This Visit   None Visit Diagnoses     Fall in home, initial encounter    -  Primary   Acute cystitis without hematuria       Skin tear of elbow without complication, left, initial encounter                       This note was created using dictation software, which may contain spelling or grammatical errors.    Loetta Rough, MD 02/18/23 [phone]

## 2023-02-18 NOTE — ED Triage Notes (Signed)
Pt BIBA from [place] [facility] [place] for unwitnessed fall, pt has large skin tear to left forearm, pt endorses pain to bilateral knees. Unknown if pt hit her head or LOC. Pt A&Ox2, hx of dementia.

## 2023-02-18 NOTE — ED Notes (Signed)
Patient transported to CT 

## 2023-02-18 NOTE — Discharge Instructions (Addendum)
Thank you for coming to [facility] Emergency Department. You were seen for fall at home. We did an exam, labs, and imaging, and these showed likely a urinary tract infection. You were given antibiotics in the ED and prescribed keflex to take four times per day for 7 days. We also prescribed zofran to use under the tongue once every 6-8 hours as needed for nausea/vomiting.   For the skin tear, the steri-strips will fall off on their own after 7 days. You can shower normally. Please change the dressing daily.  Please watch for signs of infection including increased pain, redness, swelling, pus drainage, or fever or chills.  Please follow up with your primary care provider within 1 week.   Do not hesitate to return to the ED or call 911 if you experience: -Worsening symptoms -Lightheadedness, passing out - Nausea vomiting so severe you cannot eat or drink anything -Abdominal or back pain -Worsening urinary symptoms -Fevers/chills -Anything else that concerns you

## 2023-02-19 DIAGNOSIS — N182 Chronic kidney disease, stage 2 (mild): Secondary | ICD-10-CM | POA: Diagnosis not present

## 2023-02-19 DIAGNOSIS — S41112A Laceration without foreign body of left upper arm, initial encounter: Secondary | ICD-10-CM | POA: Diagnosis not present

## 2023-02-19 DIAGNOSIS — I129 Hypertensive chronic kidney disease with stage 1 through stage 4 chronic kidney disease, or unspecified chronic kidney disease: Secondary | ICD-10-CM | POA: Diagnosis not present

## 2023-02-19 DIAGNOSIS — E039 Hypothyroidism, unspecified: Secondary | ICD-10-CM | POA: Diagnosis not present

## 2023-02-20 DIAGNOSIS — E039 Hypothyroidism, unspecified: Secondary | ICD-10-CM | POA: Diagnosis not present

## 2023-02-20 DIAGNOSIS — S41102A Unspecified open wound of left upper arm, initial encounter: Secondary | ICD-10-CM | POA: Diagnosis not present

## 2023-02-20 DIAGNOSIS — L03114 Cellulitis of left upper limb: Secondary | ICD-10-CM | POA: Diagnosis not present

## 2023-02-20 DIAGNOSIS — E785 Hyperlipidemia, unspecified: Secondary | ICD-10-CM | POA: Diagnosis not present

## 2023-02-21 DIAGNOSIS — E785 Hyperlipidemia, unspecified: Secondary | ICD-10-CM | POA: Diagnosis not present

## 2023-02-21 DIAGNOSIS — E039 Hypothyroidism, unspecified: Secondary | ICD-10-CM | POA: Diagnosis not present

## 2023-02-21 DIAGNOSIS — S41102A Unspecified open wound of left upper arm, initial encounter: Secondary | ICD-10-CM | POA: Diagnosis not present

## 2023-02-21 DIAGNOSIS — L03114 Cellulitis of left upper limb: Secondary | ICD-10-CM | POA: Diagnosis not present

## 2023-02-26 DIAGNOSIS — L03114 Cellulitis of left upper limb: Secondary | ICD-10-CM | POA: Diagnosis not present

## 2023-02-26 DIAGNOSIS — S41102A Unspecified open wound of left upper arm, initial encounter: Secondary | ICD-10-CM | POA: Diagnosis not present

## 2023-02-26 DIAGNOSIS — E785 Hyperlipidemia, unspecified: Secondary | ICD-10-CM | POA: Diagnosis not present

## 2023-02-26 DIAGNOSIS — E039 Hypothyroidism, unspecified: Secondary | ICD-10-CM | POA: Diagnosis not present

## 2023-02-27 DIAGNOSIS — L03114 Cellulitis of left upper limb: Secondary | ICD-10-CM | POA: Diagnosis not present

## 2023-02-27 DIAGNOSIS — E785 Hyperlipidemia, unspecified: Secondary | ICD-10-CM | POA: Diagnosis not present

## 2023-02-27 DIAGNOSIS — S41102A Unspecified open wound of left upper arm, initial encounter: Secondary | ICD-10-CM | POA: Diagnosis not present

## 2023-02-27 DIAGNOSIS — E039 Hypothyroidism, unspecified: Secondary | ICD-10-CM | POA: Diagnosis not present

## 2023-03-01 DIAGNOSIS — L03114 Cellulitis of left upper limb: Secondary | ICD-10-CM | POA: Diagnosis not present

## 2023-03-01 DIAGNOSIS — E785 Hyperlipidemia, unspecified: Secondary | ICD-10-CM | POA: Diagnosis not present

## 2023-03-01 DIAGNOSIS — S41102A Unspecified open wound of left upper arm, initial encounter: Secondary | ICD-10-CM | POA: Diagnosis not present

## 2023-03-01 DIAGNOSIS — E039 Hypothyroidism, unspecified: Secondary | ICD-10-CM | POA: Diagnosis not present

## 2023-03-04 DIAGNOSIS — S41102A Unspecified open wound of left upper arm, initial encounter: Secondary | ICD-10-CM | POA: Diagnosis not present

## 2023-03-04 DIAGNOSIS — L03114 Cellulitis of left upper limb: Secondary | ICD-10-CM | POA: Diagnosis not present

## 2023-03-04 DIAGNOSIS — E039 Hypothyroidism, unspecified: Secondary | ICD-10-CM | POA: Diagnosis not present

## 2023-03-04 DIAGNOSIS — E785 Hyperlipidemia, unspecified: Secondary | ICD-10-CM | POA: Diagnosis not present

## 2023-03-05 DIAGNOSIS — E039 Hypothyroidism, unspecified: Secondary | ICD-10-CM | POA: Diagnosis not present

## 2023-03-05 DIAGNOSIS — L03114 Cellulitis of left upper limb: Secondary | ICD-10-CM | POA: Diagnosis not present

## 2023-03-05 DIAGNOSIS — S41102A Unspecified open wound of left upper arm, initial encounter: Secondary | ICD-10-CM | POA: Diagnosis not present

## 2023-03-05 DIAGNOSIS — E785 Hyperlipidemia, unspecified: Secondary | ICD-10-CM | POA: Diagnosis not present

## 2023-03-06 DIAGNOSIS — E039 Hypothyroidism, unspecified: Secondary | ICD-10-CM | POA: Diagnosis not present

## 2023-03-06 DIAGNOSIS — S41102A Unspecified open wound of left upper arm, initial encounter: Secondary | ICD-10-CM | POA: Diagnosis not present

## 2023-03-06 DIAGNOSIS — L03114 Cellulitis of left upper limb: Secondary | ICD-10-CM | POA: Diagnosis not present

## 2023-03-06 DIAGNOSIS — E785 Hyperlipidemia, unspecified: Secondary | ICD-10-CM | POA: Diagnosis not present

## 2023-03-11 DIAGNOSIS — S41102A Unspecified open wound of left upper arm, initial encounter: Secondary | ICD-10-CM | POA: Diagnosis not present

## 2023-03-11 DIAGNOSIS — E785 Hyperlipidemia, unspecified: Secondary | ICD-10-CM | POA: Diagnosis not present

## 2023-03-11 DIAGNOSIS — L03114 Cellulitis of left upper limb: Secondary | ICD-10-CM | POA: Diagnosis not present

## 2023-03-11 DIAGNOSIS — E039 Hypothyroidism, unspecified: Secondary | ICD-10-CM | POA: Diagnosis not present

## 2023-03-12 DIAGNOSIS — L03114 Cellulitis of left upper limb: Secondary | ICD-10-CM | POA: Diagnosis not present

## 2023-03-12 DIAGNOSIS — E785 Hyperlipidemia, unspecified: Secondary | ICD-10-CM | POA: Diagnosis not present

## 2023-03-12 DIAGNOSIS — E039 Hypothyroidism, unspecified: Secondary | ICD-10-CM | POA: Diagnosis not present

## 2023-03-12 DIAGNOSIS — S41102A Unspecified open wound of left upper arm, initial encounter: Secondary | ICD-10-CM | POA: Diagnosis not present

## 2023-03-14 DIAGNOSIS — H353221 Exudative age-related macular degeneration, left eye, with active choroidal neovascularization: Secondary | ICD-10-CM | POA: Diagnosis not present

## 2023-03-18 DIAGNOSIS — S41102A Unspecified open wound of left upper arm, initial encounter: Secondary | ICD-10-CM | POA: Diagnosis not present

## 2023-03-18 DIAGNOSIS — E785 Hyperlipidemia, unspecified: Secondary | ICD-10-CM | POA: Diagnosis not present

## 2023-03-18 DIAGNOSIS — E039 Hypothyroidism, unspecified: Secondary | ICD-10-CM | POA: Diagnosis not present

## 2023-03-18 DIAGNOSIS — L03114 Cellulitis of left upper limb: Secondary | ICD-10-CM | POA: Diagnosis not present

## 2023-03-19 DIAGNOSIS — E039 Hypothyroidism, unspecified: Secondary | ICD-10-CM | POA: Diagnosis not present

## 2023-03-19 DIAGNOSIS — E785 Hyperlipidemia, unspecified: Secondary | ICD-10-CM | POA: Diagnosis not present

## 2023-03-19 DIAGNOSIS — L03114 Cellulitis of left upper limb: Secondary | ICD-10-CM | POA: Diagnosis not present

## 2023-03-19 DIAGNOSIS — H353211 Exudative age-related macular degeneration, right eye, with active choroidal neovascularization: Secondary | ICD-10-CM | POA: Diagnosis not present

## 2023-03-19 DIAGNOSIS — S41102A Unspecified open wound of left upper arm, initial encounter: Secondary | ICD-10-CM | POA: Diagnosis not present

## 2023-03-21 DIAGNOSIS — E039 Hypothyroidism, unspecified: Secondary | ICD-10-CM | POA: Diagnosis not present

## 2023-03-21 DIAGNOSIS — S41102A Unspecified open wound of left upper arm, initial encounter: Secondary | ICD-10-CM | POA: Diagnosis not present

## 2023-03-21 DIAGNOSIS — E785 Hyperlipidemia, unspecified: Secondary | ICD-10-CM | POA: Diagnosis not present

## 2023-03-21 DIAGNOSIS — L03114 Cellulitis of left upper limb: Secondary | ICD-10-CM | POA: Diagnosis not present

## 2023-03-26 DIAGNOSIS — E039 Hypothyroidism, unspecified: Secondary | ICD-10-CM | POA: Diagnosis not present

## 2023-03-26 DIAGNOSIS — L03114 Cellulitis of left upper limb: Secondary | ICD-10-CM | POA: Diagnosis not present

## 2023-03-26 DIAGNOSIS — E785 Hyperlipidemia, unspecified: Secondary | ICD-10-CM | POA: Diagnosis not present

## 2023-03-26 DIAGNOSIS — S41102A Unspecified open wound of left upper arm, initial encounter: Secondary | ICD-10-CM | POA: Diagnosis not present

## 2023-04-05 DIAGNOSIS — L03114 Cellulitis of left upper limb: Secondary | ICD-10-CM | POA: Diagnosis not present

## 2023-04-05 DIAGNOSIS — S41102A Unspecified open wound of left upper arm, initial encounter: Secondary | ICD-10-CM | POA: Diagnosis not present

## 2023-04-05 DIAGNOSIS — E785 Hyperlipidemia, unspecified: Secondary | ICD-10-CM | POA: Diagnosis not present

## 2023-04-05 DIAGNOSIS — E039 Hypothyroidism, unspecified: Secondary | ICD-10-CM | POA: Diagnosis not present

## 2023-04-17 DIAGNOSIS — N182 Chronic kidney disease, stage 2 (mild): Secondary | ICD-10-CM | POA: Diagnosis not present

## 2023-04-17 DIAGNOSIS — E785 Hyperlipidemia, unspecified: Secondary | ICD-10-CM | POA: Diagnosis not present

## 2023-04-17 DIAGNOSIS — E039 Hypothyroidism, unspecified: Secondary | ICD-10-CM | POA: Diagnosis not present

## 2023-04-17 DIAGNOSIS — I129 Hypertensive chronic kidney disease with stage 1 through stage 4 chronic kidney disease, or unspecified chronic kidney disease: Secondary | ICD-10-CM | POA: Diagnosis not present

## 2023-04-21 DIAGNOSIS — S41102A Unspecified open wound of left upper arm, initial encounter: Secondary | ICD-10-CM | POA: Diagnosis not present

## 2023-04-22 DIAGNOSIS — N182 Chronic kidney disease, stage 2 (mild): Secondary | ICD-10-CM | POA: Diagnosis not present

## 2023-04-22 DIAGNOSIS — I129 Hypertensive chronic kidney disease with stage 1 through stage 4 chronic kidney disease, or unspecified chronic kidney disease: Secondary | ICD-10-CM | POA: Diagnosis not present

## 2023-04-22 DIAGNOSIS — E785 Hyperlipidemia, unspecified: Secondary | ICD-10-CM | POA: Diagnosis not present

## 2023-04-22 DIAGNOSIS — E039 Hypothyroidism, unspecified: Secondary | ICD-10-CM | POA: Diagnosis not present

## 2023-04-24 DIAGNOSIS — E785 Hyperlipidemia, unspecified: Secondary | ICD-10-CM | POA: Diagnosis not present

## 2023-04-24 DIAGNOSIS — N182 Chronic kidney disease, stage 2 (mild): Secondary | ICD-10-CM | POA: Diagnosis not present

## 2023-04-24 DIAGNOSIS — E039 Hypothyroidism, unspecified: Secondary | ICD-10-CM | POA: Diagnosis not present

## 2023-04-24 DIAGNOSIS — I129 Hypertensive chronic kidney disease with stage 1 through stage 4 chronic kidney disease, or unspecified chronic kidney disease: Secondary | ICD-10-CM | POA: Diagnosis not present

## 2023-05-01 DIAGNOSIS — I129 Hypertensive chronic kidney disease with stage 1 through stage 4 chronic kidney disease, or unspecified chronic kidney disease: Secondary | ICD-10-CM | POA: Diagnosis not present

## 2023-05-01 DIAGNOSIS — E039 Hypothyroidism, unspecified: Secondary | ICD-10-CM | POA: Diagnosis not present

## 2023-05-01 DIAGNOSIS — E785 Hyperlipidemia, unspecified: Secondary | ICD-10-CM | POA: Diagnosis not present

## 2023-05-01 DIAGNOSIS — N182 Chronic kidney disease, stage 2 (mild): Secondary | ICD-10-CM | POA: Diagnosis not present

## 2023-05-14 DIAGNOSIS — H353221 Exudative age-related macular degeneration, left eye, with active choroidal neovascularization: Secondary | ICD-10-CM | POA: Diagnosis not present

## 2023-05-21 DIAGNOSIS — H353221 Exudative age-related macular degeneration, left eye, with active choroidal neovascularization: Secondary | ICD-10-CM | POA: Diagnosis not present

## 2023-05-21 DIAGNOSIS — H35362 Drusen (degenerative) of macula, left eye: Secondary | ICD-10-CM | POA: Diagnosis not present

## 2023-05-21 DIAGNOSIS — H353211 Exudative age-related macular degeneration, right eye, with active choroidal neovascularization: Secondary | ICD-10-CM | POA: Diagnosis not present

## 2023-05-23 DIAGNOSIS — N189 Chronic kidney disease, unspecified: Secondary | ICD-10-CM | POA: Diagnosis not present

## 2023-05-23 DIAGNOSIS — I129 Hypertensive chronic kidney disease with stage 1 through stage 4 chronic kidney disease, or unspecified chronic kidney disease: Secondary | ICD-10-CM | POA: Diagnosis not present

## 2023-07-02 DIAGNOSIS — E559 Vitamin D deficiency, unspecified: Secondary | ICD-10-CM | POA: Diagnosis not present

## 2023-07-02 DIAGNOSIS — Z7189 Other specified counseling: Secondary | ICD-10-CM | POA: Diagnosis not present

## 2023-07-02 DIAGNOSIS — N182 Chronic kidney disease, stage 2 (mild): Secondary | ICD-10-CM | POA: Diagnosis not present

## 2023-07-02 DIAGNOSIS — Z79899 Other long term (current) drug therapy: Secondary | ICD-10-CM | POA: Diagnosis not present

## 2023-07-02 DIAGNOSIS — R059 Cough, unspecified: Secondary | ICD-10-CM | POA: Diagnosis not present

## 2023-07-02 DIAGNOSIS — E039 Hypothyroidism, unspecified: Secondary | ICD-10-CM | POA: Diagnosis not present

## 2023-07-02 DIAGNOSIS — K8689 Other specified diseases of pancreas: Secondary | ICD-10-CM | POA: Diagnosis not present

## 2023-07-09 DIAGNOSIS — H353221 Exudative age-related macular degeneration, left eye, with active choroidal neovascularization: Secondary | ICD-10-CM | POA: Diagnosis not present

## 2023-07-11 DIAGNOSIS — E569 Vitamin deficiency, unspecified: Secondary | ICD-10-CM | POA: Diagnosis not present

## 2023-07-11 DIAGNOSIS — E559 Vitamin D deficiency, unspecified: Secondary | ICD-10-CM | POA: Diagnosis not present

## 2023-07-11 DIAGNOSIS — R7303 Prediabetes: Secondary | ICD-10-CM | POA: Diagnosis not present

## 2023-07-11 DIAGNOSIS — E039 Hypothyroidism, unspecified: Secondary | ICD-10-CM | POA: Diagnosis not present

## 2023-07-16 DIAGNOSIS — K8689 Other specified diseases of pancreas: Secondary | ICD-10-CM | POA: Diagnosis not present

## 2023-07-16 DIAGNOSIS — M1990 Unspecified osteoarthritis, unspecified site: Secondary | ICD-10-CM | POA: Diagnosis not present

## 2023-07-16 DIAGNOSIS — E559 Vitamin D deficiency, unspecified: Secondary | ICD-10-CM | POA: Diagnosis not present

## 2023-07-16 DIAGNOSIS — H353211 Exudative age-related macular degeneration, right eye, with active choroidal neovascularization: Secondary | ICD-10-CM | POA: Diagnosis not present

## 2023-07-16 DIAGNOSIS — E039 Hypothyroidism, unspecified: Secondary | ICD-10-CM | POA: Diagnosis not present

## 2023-07-16 DIAGNOSIS — N182 Chronic kidney disease, stage 2 (mild): Secondary | ICD-10-CM | POA: Diagnosis not present

## 2023-08-08 ENCOUNTER — Emergency Department (HOSPITAL_COMMUNITY)

## 2023-08-08 ENCOUNTER — Emergency Department (HOSPITAL_COMMUNITY)
Admission: EM | Admit: 2023-08-08 | Discharge: 2023-08-08 | Disposition: A | Discharge status: Home / Self Care | Attending: Emergency Medicine | Admitting: Emergency Medicine

## 2023-08-08 ENCOUNTER — Other Ambulatory Visit: Payer: Self-pay

## 2023-08-08 DIAGNOSIS — I10 Essential (primary) hypertension: Secondary | ICD-10-CM | POA: Diagnosis not present

## 2023-08-08 DIAGNOSIS — Z91040 Latex allergy status: Secondary | ICD-10-CM | POA: Diagnosis not present

## 2023-08-08 DIAGNOSIS — F0390 Unspecified dementia without behavioral disturbance: Secondary | ICD-10-CM | POA: Diagnosis not present

## 2023-08-08 DIAGNOSIS — R739 Hyperglycemia, unspecified: Secondary | ICD-10-CM | POA: Insufficient documentation

## 2023-08-08 DIAGNOSIS — S199XXA Unspecified injury of neck, initial encounter: Secondary | ICD-10-CM | POA: Diagnosis not present

## 2023-08-08 DIAGNOSIS — M25511 Pain in right shoulder: Secondary | ICD-10-CM | POA: Diagnosis not present

## 2023-08-08 DIAGNOSIS — M47816 Spondylosis without myelopathy or radiculopathy, lumbar region: Secondary | ICD-10-CM | POA: Diagnosis not present

## 2023-08-08 DIAGNOSIS — W19XXXA Unspecified fall, initial encounter: Secondary | ICD-10-CM | POA: Diagnosis not present

## 2023-08-08 DIAGNOSIS — M79603 Pain in arm, unspecified: Secondary | ICD-10-CM | POA: Diagnosis not present

## 2023-08-08 DIAGNOSIS — M19012 Primary osteoarthritis, left shoulder: Secondary | ICD-10-CM | POA: Diagnosis not present

## 2023-08-08 DIAGNOSIS — M25512 Pain in left shoulder: Secondary | ICD-10-CM | POA: Insufficient documentation

## 2023-08-08 DIAGNOSIS — S0990XA Unspecified injury of head, initial encounter: Secondary | ICD-10-CM | POA: Diagnosis not present

## 2023-08-08 LAB — COMPREHENSIVE METABOLIC PANEL WITH GFR
ALT: 18 U/L (ref 0–44)
AST: 32 U/L (ref 15–41)
Albumin: 3.7 g/dL (ref 3.5–5.0)
Alkaline Phosphatase: 65 U/L (ref 38–126)
Anion gap: 12 (ref 5–15)
BUN: 15 mg/dL (ref 8–23)
CO2: 25 mmol/L (ref 22–32)
Calcium: 9.4 mg/dL (ref 8.9–10.3)
Chloride: 101 mmol/L (ref 98–111)
Creatinine, Ser: 0.64 mg/dL (ref 0.44–1.00)
GFR, Estimated: >60 mL/min (ref >60)
Glucose, Bld: 171 mg/dL — ABNORMAL HIGH (ref 70–99)
Potassium: 4.2 mmol/L (ref 3.5–5.1)
Sodium: 138 mmol/L (ref 135–145)
Total Bilirubin: 1.1 mg/dL (ref 0.0–1.2)
Total Protein: 7.3 g/dL (ref 6.5–8.1)

## 2023-08-08 LAB — CBC WITH DIFFERENTIAL/PLATELET
Abs Immature Granulocytes: 0.06 10*3/uL (ref 0.00–0.07)
Basophils Absolute: 0 10*3/uL (ref 0.0–0.1)
Basophils Relative: 0 %
Eosinophils Absolute: 0 10*3/uL (ref 0.0–0.5)
Eosinophils Relative: 0 %
HCT: 42.3 % (ref 36.0–46.0)
Hemoglobin: 14.2 g/dL (ref 12.0–15.0)
Immature Granulocytes: 1 %
Lymphocytes Relative: 7 %
Lymphs Abs: 0.8 10*3/uL (ref 0.7–4.0)
MCH: 32.1 pg (ref 26.0–34.0)
MCHC: 33.6 g/dL (ref 30.0–36.0)
MCV: 95.7 fL (ref 80.0–100.0)
Monocytes Absolute: 0.5 10*3/uL (ref 0.1–1.0)
Monocytes Relative: 5 %
Neutro Abs: 10.1 10*3/uL — ABNORMAL HIGH (ref 1.7–7.7)
Neutrophils Relative %: 87 %
Platelets: 211 10*3/uL (ref 150–400)
RBC: 4.42 MIL/uL (ref 3.87–5.11)
RDW: 12.4 % (ref 11.5–15.5)
WBC: 11.5 10*3/uL — ABNORMAL HIGH (ref 4.0–10.5)
nRBC: 0 % (ref 0.0–0.2)

## 2023-08-08 NOTE — ED Provider Notes (Signed)
 [place] [facility]PARTMENT AT [facility] Provider Note   CSN: [id number] Arrival date & time: 08/08/23  4098     History  Chief Complaint  Patient presents with   Kristin Bryant    Kristin Bryant is a 89 y.o. female.  HPI Elderly female with dementia presents from nursing facility after witnessed fall.  Patient was initially accompanied by EMS, subsequently by family members. Patient initial pain of right shoulder pain, my exam does not complain of any pain, but then states that her left shoulder hurts.  EMS reports similar complaint of right shoulder pain, no hemodynamic instability in transport. Family notes the patient is typically in a wheelchair though she often tries to walk independently as well.  Today she fell, no report of hitting her head, no loss of consciousness, though the patient herself is unsure of this.  No report of change from baseline medical condition, or interactivity.    Home Medications Prior to Admission medications   Medication Sig Start Date End Date Taking? Authorizing Provider  acetaminophen  (TYLENOL ) 500 MG tablet Take 1,000 mg by mouth every 8 (eight) hours.    [provider]  bisacodyl (DULCOLAX) 5 MG EC tablet Take 5 mg by mouth daily as needed for moderate constipation.    [provider]  busPIRone (BUSPAR) 5 MG tablet Take 5 mg by mouth 3 (three) times daily. 01/31/23   [provider]  Cholecalciferol (VITAMIN D-3) 125 MCG (5000 UT) TABS Take 5,000 Units by mouth daily.    [provider]  latanoprost  (XALATAN ) 0.005 % ophthalmic solution Place 1 drop into both eyes at bedtime.    [provider]  levothyroxine  (SYNTHROID ) 125 MCG tablet Take 125 mcg by mouth daily. 12/08/21   [provider]  lidocaine  4 % Place 2 patches onto the skin in the morning. To right ankle, lower back.    [provider]  lipase/protease/amylase 24000-76000 units CPEP Take 1 capsule (24,000 Units total)  by mouth 3 (three) times daily before meals. 01/20/22   Lonita Roach, MD  Multiple Vitamins-Minerals (PRESERVISION AREDS 2 PO) Take 1 capsule by mouth daily.    [provider]  ondansetron  (ZOFRAN -ODT) 4 MG disintegrating tablet Take 1 tablet (4 mg total) by mouth every 8 (eight) hours as needed. 02/18/23   Merdis Stalling, MD  polyethylene glycol powder (GLYCOLAX /MIRALAX ) 17 GM/SCOOP powder Take 17 g by mouth in the morning and at bedtime. 01/20/22   Lonita Roach, MD  QUEtiapine (SEROQUEL) 25 MG tablet Take 25 mg by mouth at bedtime. 12/08/21   [provider]  senna-docusate (SENOKOT-S) 8.6-50 MG tablet Take 1 tablet by mouth daily.    [provider]  sertraline  (ZOLOFT ) 50 MG tablet Take 50 mg by mouth daily. 12/08/21   [provider]  timolol  (TIMOPTIC ) 0.5 % ophthalmic solution Place 1 drop into both eyes 2 (two) times daily.    [provider]      Allergies    Aspirin, Benzonatate, Celexa [citalopram], Chlorzoxazone, Citalopram hydrobromide, Cortisone, Diclofenac-misoprostol, Escitalopram oxalate, Guaifenesin, Guaifenesin & derivatives, Hydrocodone , Ipratropium bromide, [place] herb [convallariae majalis], Mirtazapine, Naproxen, Neosporin [bacitracin-polymyxin b], Prednisone, Pseudoephedrine, Ranitidine, Ranitidine hcl, Sudafed [pseudoephedrine hcl], Sulfa antibiotics, Epinephrine, and Latex    Review of Systems   Review of Systems  Physical Exam Updated Vital Signs BP (!) 162/67 (BP Location: Right Arm)   Pulse 96   Temp 97.6 F (36.4 C) (Oral)   Resp 17  Ht 5\' 2"  (1.575 m)   Wt 61 kg   SpO2 97%   BMI 24.60 kg/m  Physical Exam Vitals and nursing note reviewed.  Constitutional:      General: She is not in acute distress.    Appearance: She is well-developed. She is not ill-appearing or diaphoretic.  HENT:     Head: Normocephalic and atraumatic.  Eyes:     Conjunctiva/sclera: Conjunctivae normal.   Cardiovascular:     Rate and Rhythm: Normal rate and regular rhythm.  Pulmonary:     Effort: Pulmonary effort is normal. No respiratory distress.     Breath sounds: Normal breath sounds. No stridor.  Abdominal:     General: There is no distension.  Musculoskeletal:     Cervical back: No spinous process tenderness or muscular tenderness.     Comments: No obvious abnormalities upper, lower extremities, patient flexes each hip to command, without apparent pain.  Patient has nearly normal range of motion left shoulder with some difficulty with full abduction.  No crepitus, deformity.  Skin:    General: Skin is warm and dry.  Neurological:     Mental Status: She is alert and oriented to person, place, and time.     Cranial Nerves: No cranial nerve deficit.  Psychiatric:        Mood and Affect: Mood normal.     ED Results / Procedures / Treatments   Labs (all labs ordered are listed, but only abnormal results are displayed) Labs Reviewed  COMPREHENSIVE METABOLIC PANEL WITH GFR - Abnormal; Notable for the following components:      Result Value   Glucose, Bld 171 (*)    All other components within normal limits  CBC WITH DIFFERENTIAL/PLATELET - Abnormal; Notable for the following components:   WBC 11.5 (*)    Neutro Abs 10.1 (*)    All other components within normal limits    EKG EKG Interpretation Date/Time:  Thursday Aug 08 2023 09:22:20 EDT Ventricular Rate:  95 PR Interval:    QRS Duration:  65 QT Interval:  412 QTC Calculation: 518 R Axis:   -55  Text Interpretation: Sinus rhythm Artifact T wave abnormality Abnormal ECG Confirmed by Dorenda Gandy [phone]) on 08/08/2023 9:33:18 AM  Radiology CT Head Wo Contrast Result Date: 08/08/2023 CLINICAL DATA:  Polytrauma, blunt EXAM: CT HEAD WITHOUT CONTRAST CT CERVICAL SPINE WITHOUT CONTRAST TECHNIQUE: Multidetector CT imaging of the head and cervical spine was performed following the standard protocol without intravenous  contrast. Multiplanar CT image reconstructions of the cervical spine were also generated. RADIATION DOSE REDUCTION: This exam was performed according to the departmental dose-optimization program which includes automated exposure control, adjustment of the mA and/or kV according to patient size and/or use of iterative reconstruction technique. COMPARISON:  CT head CT cervical spine February 18, 2023. FINDINGS: CT HEAD FINDINGS Brain: No evidence of acute infarction, hemorrhage, hydrocephalus, extra-axial collection or mass lesion/mass effect. Vascular: No hyperdense vessel. Skull: No acute fracture. Sinuses/Orbits: Paranasal sinus mucosal thickening. No acute orbital findings. Other: No mastoid effusions. CT CERVICAL SPINE FINDINGS Alignment: No substantial sagittal subluxation. Skull base and vertebrae: Vertebral body heights are maintained. No evidence of acute fracture. Soft tissues and spinal canal: No prevertebral fluid or swelling. No visible canal hematoma. Disc levels: Multilevel cervical spondylosis, worst at C4-5, where there the disc protrusion and at least moderate spinal canal stenosis. Upper chest: Visualized lung apices are clear. IMPRESSION: 1. No evidence of acute abnormality intracranially in the cervical spine. 2. Multilevel  cervical spondylosis, worst at C4-5, where there is at least moderate spinal canal stenosis. Electronically Signed   By: Stevenson Elbe M.D.   On: 08/08/2023 11:03   CT Cervical Spine Wo Contrast Result Date: 08/08/2023 CLINICAL DATA:  Polytrauma, blunt EXAM: CT HEAD WITHOUT CONTRAST CT CERVICAL SPINE WITHOUT CONTRAST TECHNIQUE: Multidetector CT imaging of the head and cervical spine was performed following the standard protocol without intravenous contrast. Multiplanar CT image reconstructions of the cervical spine were also generated. RADIATION DOSE REDUCTION: This exam was performed according to the departmental dose-optimization program which includes automated  exposure control, adjustment of the mA and/or kV according to patient size and/or use of iterative reconstruction technique. COMPARISON:  CT head CT cervical spine February 18, 2023. FINDINGS: CT HEAD FINDINGS Brain: No evidence of acute infarction, hemorrhage, hydrocephalus, extra-axial collection or mass lesion/mass effect. Vascular: No hyperdense vessel. Skull: No acute fracture. Sinuses/Orbits: Paranasal sinus mucosal thickening. No acute orbital findings. Other: No mastoid effusions. CT CERVICAL SPINE FINDINGS Alignment: No substantial sagittal subluxation. Skull base and vertebrae: Vertebral body heights are maintained. No evidence of acute fracture. Soft tissues and spinal canal: No prevertebral fluid or swelling. No visible canal hematoma. Disc levels: Multilevel cervical spondylosis, worst at C4-5, where there the disc protrusion and at least moderate spinal canal stenosis. Upper chest: Visualized lung apices are clear. IMPRESSION: 1. No evidence of acute abnormality intracranially in the cervical spine. 2. Multilevel cervical spondylosis, worst at C4-5, where there is at least moderate spinal canal stenosis. Electronically Signed   By: Stevenson Elbe M.D.   On: 08/08/2023 11:03   DG Shoulder Left Result Date: 08/08/2023 CLINICAL DATA:  Status post fall.  Left shoulder and pelvic pain. EXAM: LEFT SHOULDER - 2+ VIEW; PELVIS - 1-2 VIEW COMPARISON:  Pelvic CT 02/18/2023. FINDINGS: Left shoulder: The bones are demineralized. No definite acute fracture or dislocation. There is a focal ossific density adjacent to the greater tuberosity, favoring calcific rotator cuff tendinosis. A small avulsion fracture is considered less likely. Mild acromioclavicular and glenohumeral degenerative changes. One-view pelvis: 2 supine views of the pelvis demonstrate osseous demineralization with stable chronic posttraumatic deformities of the right pubic rami. No evidence of acute fracture or dislocation. The hip joint spaces  are preserved. Lower lumbar spondylosis noted. IMPRESSION: 1. No definite acute fracture or dislocation identified in the left shoulder or pelvis. 2. Focal ossific density adjacent to the greater tuberosity of the left humerus, favoring calcific rotator cuff tendinosis. A small avulsion fracture is considered less likely. 3. Stable chronic posttraumatic deformities of the right pubic rami. Electronically Signed   By: Elmon Hagedorn M.D.   On: 08/08/2023 10:33   DG Pelvis 1-2 Views Result Date: 08/08/2023 CLINICAL DATA:  Status post fall.  Left shoulder and pelvic pain. EXAM: LEFT SHOULDER - 2+ VIEW; PELVIS - 1-2 VIEW COMPARISON:  Pelvic CT 02/18/2023. FINDINGS: Left shoulder: The bones are demineralized. No definite acute fracture or dislocation. There is a focal ossific density adjacent to the greater tuberosity, favoring calcific rotator cuff tendinosis. A small avulsion fracture is considered less likely. Mild acromioclavicular and glenohumeral degenerative changes. One-view pelvis: 2 supine views of the pelvis demonstrate osseous demineralization with stable chronic posttraumatic deformities of the right pubic rami. No evidence of acute fracture or dislocation. The hip joint spaces are preserved. Lower lumbar spondylosis noted. IMPRESSION: 1. No definite acute fracture or dislocation identified in the left shoulder or pelvis. 2. Focal ossific density adjacent to the greater tuberosity of the left humerus,  favoring calcific rotator cuff tendinosis. A small avulsion fracture is considered less likely. 3. Stable chronic posttraumatic deformities of the right pubic rami. Electronically Signed   By: Elmon Hagedorn M.D.   On: 08/08/2023 10:33    Procedures Procedures    Medications Ordered in ED Medications - No data to display  ED Course/ Medical Decision Making/ A&P                                 Medical Decision Making Elderly female with dementia presents after fall.  Given unclear  circumstances, and her cognitive limitation differential including intracranial, cervical, musculoskeletal injuries considered. Patient had labs as well with concern for possible weakness.  Case discussed with family, EMS, prior to evaluation. Cardiac 95 sinus normal Pulse ox 97% room air  Amount and/or Complexity of Data Reviewed Labs: ordered. Radiology: ordered.  Patient accompanied by son, daughter-in-law, we reviewed the patient's history, today's event 11:27 AM Awake and alert, in no distress, vital signs notable for mild hypertension otherwise reassuring.  I have reviewed the CTs, x-rays, labs, mild hyperglycemia, trace leukocytosis, otherwise reassuring.  With no decline in her condition here with monitoring, after repeat exam, including demonstration that she is moving arms freely, to command without any complaints on her part, patient will return to her facility.        Final Clinical Impression(s) / ED Diagnoses Final diagnoses:  Fall, initial encounter    Rx / DC Orders ED Discharge Orders     None         Dorenda Gandy, MD 08/08/23 1128

## 2023-08-08 NOTE — ED Triage Notes (Signed)
 Pt brought from [facility] assisted [facility] for fall. Pt denies hitting her head or losing consciousness. Pt was complaining of right shoulder pain, but states that the pain has gotten better.

## 2023-08-08 NOTE — Discharge Instructions (Signed)
 Today's evaluation has been reassuring, but if you develop new, or concerning changes in your condition do not hesitate to return here.

## 2023-08-20 DIAGNOSIS — H353221 Exudative age-related macular degeneration, left eye, with active choroidal neovascularization: Secondary | ICD-10-CM | POA: Diagnosis not present

## 2023-09-10 DIAGNOSIS — H353211 Exudative age-related macular degeneration, right eye, with active choroidal neovascularization: Secondary | ICD-10-CM | POA: Diagnosis not present

## 2023-09-25 ENCOUNTER — Other Ambulatory Visit: Payer: Self-pay

## 2023-09-25 ENCOUNTER — Emergency Department (HOSPITAL_COMMUNITY)
Admission: EM | Admit: 2023-09-25 | Discharge: 2023-09-25 | Disposition: A | Discharge status: Skilled Nursing Facility | Attending: Emergency Medicine | Admitting: Emergency Medicine

## 2023-09-25 ENCOUNTER — Encounter (HOSPITAL_COMMUNITY): Payer: Self-pay | Admitting: Radiology

## 2023-09-25 ENCOUNTER — Emergency Department (HOSPITAL_COMMUNITY)

## 2023-09-25 DIAGNOSIS — M8580 Other specified disorders of bone density and structure, unspecified site: Secondary | ICD-10-CM | POA: Diagnosis not present

## 2023-09-25 DIAGNOSIS — W0110XA Fall on same level from slipping, tripping and stumbling with subsequent striking against unspecified object, initial encounter: Secondary | ICD-10-CM | POA: Insufficient documentation

## 2023-09-25 DIAGNOSIS — Z91040 Latex allergy status: Secondary | ICD-10-CM | POA: Diagnosis not present

## 2023-09-25 DIAGNOSIS — Y92002 Bathroom of unspecified non-institutional (private) residence single-family (private) house as the place of occurrence of the external cause: Secondary | ICD-10-CM | POA: Diagnosis not present

## 2023-09-25 DIAGNOSIS — Z743 Need for continuous supervision: Secondary | ICD-10-CM | POA: Diagnosis not present

## 2023-09-25 DIAGNOSIS — F0390 Unspecified dementia without behavioral disturbance: Secondary | ICD-10-CM | POA: Insufficient documentation

## 2023-09-25 DIAGNOSIS — R059 Cough, unspecified: Secondary | ICD-10-CM | POA: Diagnosis not present

## 2023-09-25 DIAGNOSIS — L03115 Cellulitis of right lower limb: Secondary | ICD-10-CM | POA: Insufficient documentation

## 2023-09-25 DIAGNOSIS — R6889 Other general symptoms and signs: Secondary | ICD-10-CM | POA: Diagnosis not present

## 2023-09-25 DIAGNOSIS — L03116 Cellulitis of left lower limb: Secondary | ICD-10-CM | POA: Diagnosis not present

## 2023-09-25 DIAGNOSIS — W19XXXA Unspecified fall, initial encounter: Secondary | ICD-10-CM

## 2023-09-25 DIAGNOSIS — Z043 Encounter for examination and observation following other accident: Secondary | ICD-10-CM | POA: Diagnosis not present

## 2023-09-25 DIAGNOSIS — J189 Pneumonia, unspecified organism: Secondary | ICD-10-CM | POA: Insufficient documentation

## 2023-09-25 DIAGNOSIS — I700 Atherosclerosis of aorta: Secondary | ICD-10-CM | POA: Diagnosis not present

## 2023-09-25 DIAGNOSIS — R9389 Abnormal findings on diagnostic imaging of other specified body structures: Secondary | ICD-10-CM | POA: Diagnosis not present

## 2023-09-25 DIAGNOSIS — R0989 Other specified symptoms and signs involving the circulatory and respiratory systems: Secondary | ICD-10-CM | POA: Diagnosis not present

## 2023-09-25 DIAGNOSIS — S80919A Unspecified superficial injury of unspecified knee, initial encounter: Secondary | ICD-10-CM | POA: Diagnosis not present

## 2023-09-25 DIAGNOSIS — M25551 Pain in right hip: Secondary | ICD-10-CM | POA: Diagnosis not present

## 2023-09-25 LAB — CBC WITH DIFFERENTIAL/PLATELET
Abs Immature Granulocytes: 0.16 10*3/uL — ABNORMAL HIGH (ref 0.00–0.07)
Basophils Absolute: 0 10*3/uL (ref 0.0–0.1)
Basophils Relative: 0 %
Eosinophils Absolute: 0 10*3/uL (ref 0.0–0.5)
Eosinophils Relative: 0 %
HCT: 40.6 % (ref 36.0–46.0)
Hemoglobin: 13.6 g/dL (ref 12.0–15.0)
Immature Granulocytes: 2 %
Lymphocytes Relative: 12 %
Lymphs Abs: 1.3 10*3/uL (ref 0.7–4.0)
MCH: 31.9 pg (ref 26.0–34.0)
MCHC: 33.5 g/dL (ref 30.0–36.0)
MCV: 95.3 fL (ref 80.0–100.0)
Monocytes Absolute: 0.6 10*3/uL (ref 0.1–1.0)
Monocytes Relative: 5 %
Neutro Abs: 8.7 10*3/uL — ABNORMAL HIGH (ref 1.7–7.7)
Neutrophils Relative %: 81 %
Platelets: 221 10*3/uL (ref 150–400)
RBC: 4.26 MIL/uL (ref 3.87–5.11)
RDW: 12.3 % (ref 11.5–15.5)
WBC: 10.8 10*3/uL — ABNORMAL HIGH (ref 4.0–10.5)
nRBC: 0 % (ref 0.0–0.2)

## 2023-09-25 LAB — URINALYSIS, ROUTINE W REFLEX MICROSCOPIC
Bilirubin Urine: NEGATIVE
Glucose, UA: NEGATIVE mg/dL
Hgb urine dipstick: NEGATIVE
Ketones, ur: NEGATIVE mg/dL
Leukocytes,Ua: NEGATIVE
Nitrite: NEGATIVE
Protein, ur: NEGATIVE mg/dL
Specific Gravity, Urine: 1.012 (ref 1.005–1.030)
pH: 5 (ref 5.0–8.0)

## 2023-09-25 LAB — COMPREHENSIVE METABOLIC PANEL WITH GFR
ALT: 18 U/L (ref 0–44)
AST: 33 U/L (ref 15–41)
Albumin: 3.5 g/dL (ref 3.5–5.0)
Alkaline Phosphatase: 74 U/L (ref 38–126)
Anion gap: 11 (ref 5–15)
BUN: 15 mg/dL (ref 8–23)
CO2: 26 mmol/L (ref 22–32)
Calcium: 9.3 mg/dL (ref 8.9–10.3)
Chloride: 99 mmol/L (ref 98–111)
Creatinine, Ser: 0.76 mg/dL (ref 0.44–1.00)
GFR, Estimated: >60 mL/min (ref >60)
Glucose, Bld: 183 mg/dL — ABNORMAL HIGH (ref 70–99)
Potassium: 4.3 mmol/L (ref 3.5–5.1)
Sodium: 136 mmol/L (ref 135–145)
Total Bilirubin: 1.2 mg/dL (ref 0.0–1.2)
Total Protein: 6.8 g/dL (ref 6.5–8.1)

## 2023-09-25 LAB — BRAIN NATRIURETIC PEPTIDE: B Natriuretic Peptide: 107.5 pg/mL — ABNORMAL HIGH (ref 0.0–100.0)

## 2023-09-25 LAB — TROPONIN I (HIGH SENSITIVITY)
Troponin I (High Sensitivity): 3 ng/L (ref <18)
Troponin I (High Sensitivity): 3 ng/L (ref <18)

## 2023-09-25 LAB — I-STAT CG4 LACTIC ACID, ED
Lactic Acid, Venous: 2.2 mmol/L (ref 0.5–1.9)
Lactic Acid, Venous: 2 mmol/L (ref 0.5–1.9)

## 2023-09-25 LAB — CK: Total CK: 142 U/L (ref 38–234)

## 2023-09-25 MED ORDER — ALBUTEROL SULFATE (2.5 MG/3ML) 0.083% IN NEBU
2.5000 mg | INHALATION_SOLUTION | Freq: Once | RESPIRATORY_TRACT | Status: AC
  Administered 2023-09-25: 2.5 mg via RESPIRATORY_TRACT
  Filled 2023-09-25: qty 3

## 2023-09-25 MED ORDER — DOXYCYCLINE HYCLATE 100 MG PO CAPS: 100.0000 mg | ORAL_CAPSULE | Freq: Two times a day (BID) | ORAL | 0 refills | Status: DC

## 2023-09-25 MED ORDER — DOXYCYCLINE HYCLATE 100 MG PO TABS
100.0000 mg | ORAL_TABLET | Freq: Once | ORAL | Status: AC
  Administered 2023-09-25: 100 mg via ORAL
  Filled 2023-09-25: qty 1

## 2023-09-25 MED ORDER — AEROCHAMBER Z-STAT PLUS/MEDIUM MISC
1.0000 | Freq: Once | Status: AC
  Administered 2023-09-25: 1
  Filled 2023-09-25: qty 1

## 2023-09-25 MED ORDER — ALBUTEROL SULFATE HFA 108 (90 BASE) MCG/ACT IN AERS
2.0000 | INHALATION_SPRAY | RESPIRATORY_TRACT | Status: DC | PRN
  Administered 2023-09-25: 2 via RESPIRATORY_TRACT
  Filled 2023-09-25: qty 6.7

## 2023-09-25 MED ORDER — SODIUM CHLORIDE 0.9 % IV SOLN: Freq: Once | INTRAVENOUS | Status: AC

## 2023-09-25 NOTE — ED Notes (Signed)
 Provider just left room

## 2023-09-25 NOTE — Discharge Instructions (Addendum)
 1.  At this time, you have had a cough and some crackles in your lungs.  Your chest x-ray does not specifically show pneumonia but your symptoms are suggestive of a.  You are being treated with an antibiotic called doxycycline.  You have been given an inhaler to use.  This should help with the coughing episodes.  You may use it every 4 hours as needed. 2.  You have already 2 areas of redness on your legs.  This is likely due to pressure where you were lying on the floor.  However, it may be the development of some skin infection called cellulitis.  Reviewed this information in your discharge instructions.  The antibiotic you were given for your pneumonia will also be appropriate for this type of infection.  Return if the redness is getting worse and expanding or you get fever or chills or general illness

## 2023-09-25 NOTE — ED Notes (Signed)
 Patient is resting comfortably.

## 2023-09-25 NOTE — ED Provider Notes (Signed)
 [place] EMERGENCY DEPARTMENT AT [facility] Provider Note   CSN: [id number] Arrival date & time: 09/25/23  4782     Patient presents with: Kristin Bryant is a 89 y.o. female.   HPI Patient lives at an assisted living.  Ostensibly she is being checked on every several hours.  Her family however suspects that they check and might not be occurring that frequently.  The patient was found this morning at morning check on the floor.  She was awake and alert.  She is transported for additional evaluation.  Patient does not have any specific complaints.  She denies areas of significant pain.  She does not really recall how she ended up falling.  Family members report that she falls as she gets up sometimes to try to go to the bathroom.    Prior to Admission medications   Medication Sig Start Date End Date Taking? Authorizing Provider  acetaminophen  (TYLENOL ) 325 MG tablet Take 650 mg by mouth every 8 (eight) hours as needed for mild pain (pain score 1-3) or moderate pain (pain score 4-6).   Yes [provider]  bisacodyl (DULCOLAX) 5 MG EC tablet Take 5 mg by mouth daily as needed for moderate constipation.   Yes [provider]  busPIRone (BUSPAR) 5 MG tablet Take 5 mg by mouth 3 (three) times daily. 01/31/23  Yes [provider]  Cholecalciferol (VITAMIN D-3) 125 MCG (5000 UT) TABS Take 5,000 Units by mouth daily.   Yes [provider]  doxycycline (VIBRAMYCIN) 100 MG capsule Take 1 capsule (100 mg total) by mouth 2 (two) times daily. 09/25/23  Yes Alishea Beaudin, Bufford Carne, MD  latanoprost  (XALATAN ) 0.005 % ophthalmic solution Place 1 drop into both eyes at bedtime.   Yes [provider]  levothyroxine  (SYNTHROID ) 125 MCG tablet Take 125 mcg by mouth daily. 12/08/21  Yes [provider]  lidocaine  4 % Place 1 patch onto the skin in the morning. To right ankle, lower back.   Yes [provider]  lipase/protease/amylase  24000-76000 units CPEP Take 1 capsule (24,000 Units total) by mouth 3 (three) times daily before meals. 01/20/22  Yes Lonita Roach, MD  Multiple Vitamins-Minerals (PRESERVISION AREDS 2 PO) Take 1 capsule by mouth daily.   Yes [provider]  ondansetron  (ZOFRAN -ODT) 4 MG disintegrating tablet Take 1 tablet (4 mg total) by mouth every 8 (eight) hours as needed. Patient taking differently: Take 4 mg by mouth every 8 (eight) hours as needed for nausea or vomiting. 02/18/23  Yes Merdis Stalling, MD  polyethylene glycol powder (GLYCOLAX /MIRALAX ) 17 GM/SCOOP powder Take 17 g by mouth in the morning and at bedtime. Patient taking differently: Take 17 g by mouth daily as needed for mild constipation or moderate constipation. 01/20/22  Yes Lonita Roach, MD  QUEtiapine (SEROQUEL) 25 MG tablet Take 12.5 mg by mouth at bedtime. 12/08/21  Yes [provider]  senna-docusate (SENOKOT-S) 8.6-50 MG tablet Take 1 tablet by mouth daily as needed for mild constipation or moderate constipation.   Yes [provider]  sertraline  (ZOLOFT ) 50 MG tablet Take 50 mg by mouth daily. 12/08/21  Yes [provider]  timolol  (TIMOPTIC ) 0.5 % ophthalmic solution Place 1 drop into both eyes 2 (two) times daily.   Yes [provider]    Allergies: Aspirin, Benzonatate, Celexa [citalopram], Chlorzoxazone, Cortisone, Diclofenac-misoprostol, Escitalopram oxalate, Guaifenesin, Guaifenesin & derivatives, Hydrocodone , Ipratropium bromide, Lily of [place] valley herb [convallariae majalis], Mirtazapine, Naproxen,  Neosporin [bacitracin-polymyxin b], Prednisone, Pseudoephedrine, Ranitidine, Ranitidine hcl, Sulfa antibiotics, Tylenol  [acetaminophen ], Epinephrine, and Latex    Review of Systems  Updated Vital Signs BP (!) 115/55   Pulse 72   Temp 97.6 F (36.4 C) (Oral)   Resp (!) 23   SpO2 96%   Physical Exam Constitutional:      Comments: Patient is alert.  No acute distress.  No  respiratory distress.  Intermittent harsh coughing paroxysm.  HENT:     Head: Normocephalic and atraumatic.     Nose: Nose normal.     Mouth/Throat:     Pharynx: Oropharynx is clear.   Eyes:     Extraocular Movements: Extraocular movements intact.     Pupils: Pupils are equal, round, and reactive to light.   Neck:     Comments: No midline C-spine tenderness Cardiovascular:     Rate and Rhythm: Normal rate and regular rhythm.  Pulmonary:     Effort: Pulmonary effort is normal.     Comments: No respiratory distress.  Patient did however have intermittent harsh cough paroxysms.  There were scattered wheezes present with occasional crackle. Chest:     Chest wall: No tenderness.  Abdominal:     General: There is no distension.     Palpations: Abdomen is soft.     Tenderness: There is no abdominal tenderness. There is no guarding.   Musculoskeletal:     Comments: Patient has intact range of motion upper extremities.  No deformities noted.  Patient does endorse pain to flexion at the right hip.  She indicates pain right at her pubic bone area.  No appearance of deformity.  No deformities at the knees or lower legs.  No pain to palpation of the ankles.  Patient does have patches of erythema to the lateral right leg approximately 15 cm x 5 cm warm and well-demarcated.  There is also a warm and well-demarcated patch of erythema to the medial aspect of the left leg.  This 1 is approximately 10 x 5 cm.  By appearance I suspect these are pressure areas from having laying on the floor with the 1 knee against the floor and the left knee resting on the leg.  However, consideration given to cellulitis.   Skin:    General: Skin is warm and dry.   Neurological:     Comments: Patient is alert and predominantly situationally oriented.  Sometimes she seems a little bit forgetful and she continues but overall answering questions appropriately.  No focal neurologic deficits.  Psychiatric:        Mood and  Affect: Mood normal.     (all labs ordered are listed, but only abnormal results are displayed) Labs Reviewed  BRAIN NATRIURETIC PEPTIDE - Abnormal; Notable for the following components:      Result Value   B Natriuretic Peptide 107.5 (*)    All other components within normal limits  COMPREHENSIVE METABOLIC PANEL WITH GFR - Abnormal; Notable for the following components:   Glucose, Bld 183 (*)    All other components within normal limits  CBC WITH DIFFERENTIAL/PLATELET - Abnormal; Notable for the following components:   WBC 10.8 (*)    Neutro Abs 8.7 (*)    Abs Immature Granulocytes 0.16 (*)    All other components within normal limits  I-STAT CG4 LACTIC ACID, ED - Abnormal; Notable for the following components:   Lactic Acid, Venous 2.2 (*)    All other components within normal limits  I-STAT CG4 LACTIC ACID,  ED - Abnormal; Notable for the following components:   Lactic Acid, Venous 2.0 (*)    All other components within normal limits  CK  URINALYSIS, ROUTINE W REFLEX MICROSCOPIC  TROPONIN I (HIGH SENSITIVITY)  TROPONIN I (HIGH SENSITIVITY)    EKG: None  Radiology: [facility] Chest Port 1 View Result Date: 09/25/2023 CLINICAL DATA:  Cough.  Fall. EXAM: PORTABLE CHEST 1 VIEW COMPARISON:  01/17/2022 FINDINGS: Low lung volumes. Mildly elevated right hemidiaphragm. Heart size considered upper normal. Atherosclerotic calcification of the aortic arch. No pneumothorax or blunting of the costophrenic angles. No fracture identified. IMPRESSION: 1. Low lung volumes. 2. Mildly elevated right hemidiaphragm. 3. Aortic Atherosclerosis (ICD10-I70.0). Electronically Signed   By: Freida Jes M.D.   On: 09/25/2023 13:27   DG Hip Unilat With Pelvis 2-3 Views Right Result Date: 09/25/2023 CLINICAL DATA:  Right hip pain following a fall. EXAM: DG HIP (WITH OR WITHOUT PELVIS) 2-3V RIGHT COMPARISON:  08/08/2023 and 01/12/2022 FINDINGS: Stable old, healed fracture of the right inferior pubic ramus. No  acute fracture or dislocation seen. Diffuse osteopenia. IMPRESSION: 1. No acute fracture. 2. Diffuse osteopenia. Electronically Signed   By: Catherin Closs M.D.   On: 09/25/2023 12:02   CT Head Wo Contrast Result Date: 09/25/2023 CLINICAL DATA:  89 year old female status post unwitnessed fall. Unknown loss of consciousness. EXAM: CT HEAD WITHOUT CONTRAST TECHNIQUE: Contiguous axial images were obtained from the base of the skull through the vertex without intravenous contrast. RADIATION DOSE REDUCTION: This exam was performed according to the departmental dose-optimization program which includes automated exposure control, adjustment of the mA and/or kV according to patient size and/or use of iterative reconstruction technique. COMPARISON:  08/08/2023 head CT. FINDINGS: Brain: Stable cerebral volume. No midline shift, ventriculomegaly, mass effect, evidence of mass lesion, intracranial hemorrhage or evidence of cortically based acute infarction. Gray-white differentiation stable, mild for age periventricular white matter changes. Vascular: Calcified atherosclerosis at the skull base. No suspicious intracranial vascular hyperdensity. Skull: Osteopenia. Stable visualized osseous structures. No acute osseous abnormality identified. Sinuses/Orbits: Similar scattered paranasal sinus mucosal thickening. No layering sinus hemorrhage. Tympanic cavities and mastoids well aerated. Other: No acute orbit or scalp soft tissue injury identified. IMPRESSION: Stable, no acute intracranial abnormality or acute traumatic injury identified. Electronically Signed   By: Marlise Simpers M.D.   On: 09/25/2023 10:55     Procedures   Medications Ordered in the ED  doxycycline (VIBRA-TABS) tablet 100 mg (has no administration in time range)  albuterol (VENTOLIN HFA) 108 (90 Base) MCG/ACT inhaler 2 puff (has no administration in time range)  aerochamber Z-Stat Plus/medium 1 each (has no administration in time range)  albuterol (PROVENTIL)  (2.5 MG/3ML) 0.083% nebulizer solution 2.5 mg (2.5 mg Nebulization Given 09/25/23 1027)  0.9 %  sodium chloride  infusion ( Intravenous New Bag/Given 09/25/23 1026)                                    Medical Decision Making Amount and/or Complexity of Data Reviewed Labs: ordered. Radiology: ordered.  Risk Prescription drug management.   Patient presents as outlined.  She has some mobility problems described by family with falls trying to go to the bathroom.  Patient is in an assisted living setting whereby she is being checked on.  She had an unwitnessed fall.  There are no obvious injuries.  Will proceed with CT head given patient's age.  She does not have any  reproducible cervical spine pain nor neurologic dysfunction.  At this time I do not think we need cervical spine imaging.  Patient does have a pronounced cough during examination.  Family members report this has been going on for about a week.  Will obtain chest x-ray.  She does not have any pain to palpation of the chest wall to suggest acute rib fractures.  Patient also does not have abdominal pain.  This time I have low suspicion for significant intrathoracic or intra-abdominal trauma.  Patient does have pain at the right pubis with range of motion which suggest possible pubic ramus fracture.  Will obtain imaging of pelvis and hip.  The patient does have tubes plaques of erythema on the right and left lower extremity at about the level of the knee and lower leg.  These correspond most likely to pressure areas from being on the floor.  Will obtain basic lab work and urinalysis to rule out any other metabolic derangement or area of suspected infectious illness.  Urinalysis negative.  Lactic 2.0.  Troponin 3.  BNP 107.5.  CK1 42.  Comprehensive metabolic panel normal except glucose 183.  GFR greater than 60.  White count 10.8.  CT head and chest x-ray by radiology no acute findings.  At this time with diagnostic workup fairly unremarkable,  will opt to treat a suspected bronchitis versus community-acquired pneumonia with doxycycline.  Patient also has 2 patches of erythema on the lower extremities that could represent cellulitis although I have higher suspicion for pressure injury.  Doxycycline could be effective for both of these conditions.  At this time we will plan for return to assisted living.  All reviewed with the patient's son-in-law family members at bedside.       Final diagnoses:  Community acquired pneumonia, unspecified laterality  Fall, initial encounter  Cellulitis of right lower extremity  Cellulitis of left lower extremity    ED Discharge Orders          Ordered    doxycycline (VIBRAMYCIN) 100 MG capsule  2 times daily        09/25/23 1434               Wynetta Heckle, MD 09/25/23 1454

## 2023-09-25 NOTE — ED Notes (Signed)
 Family updated as to patient's status.

## 2023-09-25 NOTE — ED Notes (Signed)
 Brought pt to [facility] using tech assistance with me. Pt is steady yet slow on her feet with 2A. Pt obtained urine sample, and we changed/removed ems sheets and blankets and provided her with 4 new warm ones. Pt in nad at this time. Pt has two warm spots on her lower legs, they are red but blanchable, and hot provider aware

## 2023-09-25 NOTE — ED Triage Notes (Signed)
 Pt presents today from [place] at [facility] for unwitnessed fall between 0630 and 0730. Pt was coming out of bathroom and fell face first. +headstrike, unknown LOC, -thinner use. Pt wearing c-collar in room states no pain. Hx dementia, complaining for ems of body aches and L knee pain. Pt CBG 207, o297%, HR85, 162/60. When asked normal triage questions, pt states she has thought about putting myself out of my misery and ending it all provider aware

## 2023-09-25 NOTE — ED Notes (Signed)
 Got pt dressed and wheeled to car

## 2023-09-25 NOTE — ED Notes (Signed)
 Called facility spoke with a staff member who sent me to directors desk, no answer. Called facility back, and they game me a new number for director. Called three times, no answer, busy tone, and then fax tone x2.

## 2023-10-01 DIAGNOSIS — H353221 Exudative age-related macular degeneration, left eye, with active choroidal neovascularization: Secondary | ICD-10-CM | POA: Diagnosis not present

## 2023-11-05 DIAGNOSIS — H353211 Exudative age-related macular degeneration, right eye, with active choroidal neovascularization: Secondary | ICD-10-CM | POA: Diagnosis not present

## 2023-11-18 ENCOUNTER — Encounter (HOSPITAL_COMMUNITY): Payer: Self-pay

## 2023-11-18 ENCOUNTER — Emergency Department (HOSPITAL_COMMUNITY)

## 2023-11-18 ENCOUNTER — Emergency Department (HOSPITAL_COMMUNITY)
Admission: EM | Admit: 2023-11-18 | Discharge: 2023-11-18 | Disposition: A | Discharge status: Home / Self Care | Source: Skilled Nursing Facility | Attending: Emergency Medicine | Admitting: Emergency Medicine

## 2023-11-18 ENCOUNTER — Other Ambulatory Visit: Payer: Self-pay

## 2023-11-18 DIAGNOSIS — K8689 Other specified diseases of pancreas: Secondary | ICD-10-CM | POA: Diagnosis not present

## 2023-11-18 DIAGNOSIS — R197 Diarrhea, unspecified: Secondary | ICD-10-CM | POA: Diagnosis not present

## 2023-11-18 DIAGNOSIS — Z91040 Latex allergy status: Secondary | ICD-10-CM | POA: Insufficient documentation

## 2023-11-18 DIAGNOSIS — R1012 Left upper quadrant pain: Secondary | ICD-10-CM | POA: Insufficient documentation

## 2023-11-18 DIAGNOSIS — Z743 Need for continuous supervision: Secondary | ICD-10-CM | POA: Diagnosis not present

## 2023-11-18 DIAGNOSIS — R1032 Left lower quadrant pain: Secondary | ICD-10-CM | POA: Insufficient documentation

## 2023-11-18 DIAGNOSIS — R112 Nausea with vomiting, unspecified: Secondary | ICD-10-CM | POA: Insufficient documentation

## 2023-11-18 DIAGNOSIS — K5750 Diverticulosis of both small and large intestine without perforation or abscess without bleeding: Secondary | ICD-10-CM | POA: Diagnosis not present

## 2023-11-18 DIAGNOSIS — R911 Solitary pulmonary nodule: Secondary | ICD-10-CM | POA: Insufficient documentation

## 2023-11-18 DIAGNOSIS — R932 Abnormal findings on diagnostic imaging of liver and biliary tract: Secondary | ICD-10-CM | POA: Diagnosis not present

## 2023-11-18 LAB — CBC WITH DIFFERENTIAL/PLATELET
Abs Immature Granulocytes: 0.02 K/uL (ref 0.00–0.07)
Basophils Absolute: 0 K/uL (ref 0.0–0.1)
Basophils Relative: 1 %
Eosinophils Absolute: 0.2 K/uL (ref 0.0–0.5)
Eosinophils Relative: 2 %
HCT: 43.5 % (ref 36.0–46.0)
Hemoglobin: 14.2 g/dL (ref 12.0–15.0)
Immature Granulocytes: 0 %
Lymphocytes Relative: 15 %
Lymphs Abs: 1.2 K/uL (ref 0.7–4.0)
MCH: 31.9 pg (ref 26.0–34.0)
MCHC: 32.6 g/dL (ref 30.0–36.0)
MCV: 97.8 fL (ref 80.0–100.0)
Monocytes Absolute: 0.4 K/uL (ref 0.1–1.0)
Monocytes Relative: 5 %
Neutro Abs: 6.1 K/uL (ref 1.7–7.7)
Neutrophils Relative %: 77 %
Platelets: 240 K/uL (ref 150–400)
RBC: 4.45 MIL/uL (ref 3.87–5.11)
RDW: 12.4 % (ref 11.5–15.5)
WBC: 7.9 K/uL (ref 4.0–10.5)
nRBC: 0 % (ref 0.0–0.2)

## 2023-11-18 LAB — COMPREHENSIVE METABOLIC PANEL WITH GFR
ALT: 24 U/L (ref 0–44)
AST: 43 U/L — ABNORMAL HIGH (ref 15–41)
Albumin: 3.3 g/dL — ABNORMAL LOW (ref 3.5–5.0)
Alkaline Phosphatase: 100 U/L (ref 38–126)
Anion gap: 12 (ref 5–15)
BUN: 10 mg/dL (ref 8–23)
CO2: 27 mmol/L (ref 22–32)
Calcium: 10 mg/dL (ref 8.9–10.3)
Chloride: 103 mmol/L (ref 98–111)
Creatinine, Ser: 0.82 mg/dL (ref 0.44–1.00)
GFR, Estimated: >60 mL/min (ref >60)
Glucose, Bld: 163 mg/dL — ABNORMAL HIGH (ref 70–99)
Potassium: 3.9 mmol/L (ref 3.5–5.1)
Sodium: 142 mmol/L (ref 135–145)
Total Bilirubin: 1.1 mg/dL (ref 0.0–1.2)
Total Protein: 7.3 g/dL (ref 6.5–8.1)

## 2023-11-18 LAB — MAGNESIUM: Magnesium: 2 mg/dL (ref 1.7–2.4)

## 2023-11-18 LAB — LIPASE, BLOOD: Lipase: 23 U/L (ref 11–51)

## 2023-11-18 MED ORDER — LACTATED RINGERS IV BOLUS
1000.0000 mL | Freq: Once | INTRAVENOUS | Status: AC
  Administered 2023-11-18 (×2): 1000 mL via INTRAVENOUS

## 2023-11-18 MED ORDER — IOHEXOL 300 MG/ML  SOLN
100.0000 mL | Freq: Once | INTRAMUSCULAR | Status: AC | PRN
  Administered 2023-11-18 (×2): 100 mL via INTRAVENOUS

## 2023-11-18 NOTE — ED Provider Notes (Signed)
 [place] EMERGENCY DEPARTMENT AT [facility] Provider Note   CSN: [id number] Arrival date & time: 11/18/23  1049     Patient presents with: Diarrhea and Nausea   Kristin Bryant is a 89 y.o. female.   HPI 89 year old female presents with diarrhea and nausea/vomiting.  Patient tells me that the diarrhea started yesterday and she has had a large amount.  She does not think there has been blood but has not looked at it.  She tried to drink some water today but spit it back up.  She does not think she has had a fever.  She denies any abdominal pain.  She feels like her mouth is dry and she is dehydrated.  Prior to Admission medications   Medication Sig Start Date End Date Taking? Authorizing Provider  acetaminophen  (TYLENOL ) 325 MG tablet Take 650 mg by mouth every 8 (eight) hours as needed for mild pain (pain score 1-3) or moderate pain (pain score 4-6).    [provider]  bisacodyl (DULCOLAX) 5 MG EC tablet Take 5 mg by mouth daily as needed for moderate constipation.    [provider]  busPIRone (BUSPAR) 5 MG tablet Take 5 mg by mouth 3 (three) times daily. 01/31/23   [provider]  Cholecalciferol (VITAMIN D-3) 125 MCG (5000 UT) TABS Take 5,000 Units by mouth daily.    [provider]  doxycycline  (VIBRAMYCIN ) 100 MG capsule Take 1 capsule (100 mg total) by mouth 2 (two) times daily. 09/25/23   Armenta Canning, MD  latanoprost  (XALATAN ) 0.005 % ophthalmic solution Place 1 drop into both eyes at bedtime.    [provider]  levothyroxine  (SYNTHROID ) 125 MCG tablet Take 125 mcg by mouth daily. 12/08/21   [provider]  lidocaine  4 % Place 1 patch onto the skin in the morning. To right ankle, lower back.    [provider]  lipase/protease/amylase 24000-76000 units CPEP Take 1 capsule (24,000 Units total) by mouth 3 (three) times daily before meals. 01/20/22   [name], MD  Multiple Vitamins-Minerals  (PRESERVISION AREDS 2 PO) Take 1 capsule by mouth daily.    [provider]  ondansetron  (ZOFRAN -ODT) 4 MG disintegrating tablet Take 1 tablet (4 mg total) by mouth every 8 (eight) hours as needed. Patient taking differently: Take 4 mg by mouth every 8 (eight) hours as needed for nausea or vomiting. 02/18/23   Franklyn Sid SAILOR, MD  polyethylene glycol powder (GLYCOLAX /MIRALAX ) 17 GM/SCOOP powder Take 17 g by mouth in the morning and at bedtime. Patient taking differently: Take 17 g by mouth daily as needed for mild constipation or moderate constipation. 01/20/22   [name], MD  QUEtiapine (SEROQUEL) 25 MG tablet Take 12.5 mg by mouth at bedtime. 12/08/21   [provider]  senna-docusate (SENOKOT-S) 8.6-50 MG tablet Take 1 tablet by mouth daily as needed for mild constipation or moderate constipation.    [provider]  sertraline  (ZOLOFT ) 50 MG tablet Take 50 mg by mouth daily. 12/08/21   [provider]  timolol  (TIMOPTIC ) 0.5 % ophthalmic solution Place 1 drop into both eyes 2 (two) times daily.    [provider]    Allergies: Aspirin, Benzonatate, Celexa [citalopram], Chlorzoxazone, Cortisone, Diclofenac-misoprostol, Escitalopram oxalate, Guaifenesin, Guaifenesin & derivatives, Hydrocodone , Ipratropium bromide, Lily of the valley herb [convallariae majalis], Mirtazapine, Naproxen, Neosporin [bacitracin-polymyxin b], Prednisone, Pseudoephedrine, Ranitidine, Ranitidine hcl, Sulfa antibiotics, Tylenol  [acetaminophen ], Epinephrine, and Latex    Review of Systems  Constitutional:  Negative for fever.  Cardiovascular:  Negative for chest pain.  Gastrointestinal:  Positive for diarrhea, nausea and vomiting. Negative for abdominal pain.  Neurological:  Negative for light-headedness.    Updated Vital Signs BP (!) 150/67 (BP Location: Left Arm)   Pulse 76   Temp 97.7 F (36.5 C) (Oral)   Resp 16   Ht 5' 2 (1.575 m)   Wt 61 kg   SpO2 91%    BMI 24.60 kg/m   Physical Exam Vitals and nursing note reviewed.  Constitutional:      General: She is not in acute distress.    Appearance: She is well-developed. She is not ill-appearing or diaphoretic.  HENT:     Head: Normocephalic and atraumatic.     Mouth/Throat:     Mouth: Mucous membranes are dry.  Cardiovascular:     Rate and Rhythm: Normal rate and regular rhythm.     Heart sounds: Normal heart sounds.  Pulmonary:     Effort: Pulmonary effort is normal.     Breath sounds: Normal breath sounds.  Abdominal:     Palpations: Abdomen is soft.     Tenderness: There is abdominal tenderness in the left upper quadrant and left lower quadrant.  Skin:    General: Skin is warm and dry.  Neurological:     Mental Status: She is alert.     (all labs ordered are listed, but only abnormal results are displayed) Labs Reviewed  COMPREHENSIVE METABOLIC PANEL WITH GFR - Abnormal; Notable for the following components:      Result Value   Glucose, Bld 163 (*)    Albumin 3.3 (*)    AST 43 (*)    All other components within normal limits  C DIFFICILE QUICK SCREEN W PCR REFLEX    GASTROINTESTINAL PANEL BY PCR, STOOL (REPLACES STOOL CULTURE)  LIPASE, BLOOD  CBC WITH DIFFERENTIAL/PLATELET  MAGNESIUM    EKG: None  Radiology: CT ABDOMEN PELVIS W CONTRAST Result Date: 11/18/2023 CLINICAL DATA:  Left lower quadrant abdominal pain EXAM: CT ABDOMEN AND PELVIS WITH CONTRAST TECHNIQUE: Multidetector CT imaging of the abdomen and pelvis was performed using the standard protocol following bolus administration of intravenous contrast. RADIATION DOSE REDUCTION: This exam was performed according to the departmental dose-optimization program which includes automated exposure control, adjustment of the mA and/or kV according to patient size and/or use of iterative reconstruction technique. CONTRAST:  OMNIPAQUE  IOHEXOL  300 MG/ML  SOLN COMPARISON:  CT abdomen and pelvis dated 02/18/2023 FINDINGS:  Lower chest: 7 x 5 mm left basilar pulmonary nodule (2:17), appears new from 02/18/2023. Basilar right middle lobe subsegmental atelectasis. Trace right pleural effusion. Partially imaged heart size is normal. Coronary artery calcifications. Hepatobiliary: Diffuse parenchymal hypoattenuation can be seen with hepatic steatosis. No intra or extrahepatic biliary ductal dilation. Normal gallbladder. Pancreas: Diffuse pancreatic atrophy with unchanged main pancreatic ductal dilation measuring 5 mm upstream of an intra ductal stone in the pancreatic neck measuring 1.4 x 0.7 cm (6:27). Additional coarse calcifications near the pancreatic head. Spleen: Normal in size without focal abnormality. Adrenals/Urinary Tract: No adrenal nodules. No suspicious renal mass, calculi or hydronephrosis. Unchanged fluid density structure in the left pararenal space abutting the medial left upper pole measures 5.7 x 3.3 cm, which may represent lymphatic malformation or exophytic renal cyst. No focal bladder wall thickening. Stomach/Bowel: Small hiatal hernia. Normal appearance of the stomach. Large proximal duodenal diverticulum near the ampulla. No evidence of bowel wall thickening, distention, or inflammatory changes. Colonic diverticulosis without  acute diverticulitis. Appendix is not discretely seen. Vascular/Lymphatic: Aortic atherosclerosis. No enlarged abdominal or pelvic lymph nodes. Reproductive: No adnexal masses. Other: Mild presacral edema.  No free air. Musculoskeletal: Healing left anterolateral fifth rib fracture. Multilevel degenerative changes of the partially imaged thoracic and lumbar spine. Unchanged compression of T12. IMPRESSION: 1. No acute abdominopelvic abnormality. 2. Colonic diverticulosis without acute diverticulitis. 3. Hepatic steatosis. 4. Healing left anterolateral fifth rib fracture. 5. New 7 x 5 mm left basilar pulmonary nodule. Non-contrast chest CT at 6-12 months is recommended. If the nodule is stable at  time of repeat CT, then future CT at 18-24 months (from today's scan) is considered optional for low-risk patients, but is recommended for high-risk patients. This recommendation follows the consensus statement: Guidelines for Management of Incidental Pulmonary Nodules Detected on CT Images: From the Fleischner Society 2017; Radiology 2017; 284:228-243. 6. Aortic Atherosclerosis (ICD10-I70.0). Coronary artery calcifications. Assessment for potential risk factor modification, dietary therapy or pharmacologic therapy may be warranted, if clinically indicated. Electronically Signed   By: Limin  Xu M.D.   On: 11/18/2023 14:16     Procedures   Medications Ordered in the ED  lactated ringers  bolus 1,000 mL (0 mLs Intravenous Stopped 11/18/23 1503)  iohexol  (OMNIPAQUE ) 300 MG/ML solution 100 mL (100 mLs Intravenous Contrast Given 11/18/23 1252)                                    Medical Decision Making Amount and/or Complexity of Data Reviewed Labs: ordered.    Details: Unremarkable electrolytes, normal WBC Radiology: ordered and independent interpretation performed.    Details: No diverticulitis  Risk Prescription drug management.   Patient presents with diarrhea.  She was given some IV fluids.  She otherwise has some mild abdominal tenderness on the left side.  However CT scan is reassuring.  Son arrived after initial workup and at this point the patient seems to be at her baseline and feeling okay.  She has not had any vomiting.  She has gone to urinate but only had a small amount of stool on her brief according to the nurse.  She has not had any significant diarrhea here to collect a sample.  At this point, I do not think antibiotics are warranted and she has not had any vomiting here.  Pain seems to be controlled.  I think is reasonable for supportive care at her nursing facility and return if symptoms worsen.  I also updated the son on the finding of a pulmonary nodule and repeat CT being  needed in 6-12 months.     Final diagnoses:  Diarrhea of presumed infectious origin  Pulmonary nodule, left    ED Discharge Orders     None          Freddi Hamilton, MD 11/18/23 [phone]

## 2023-11-18 NOTE — Discharge Instructions (Addendum)
 You may use Imodium and Pepto-Bismol to help with diarrhea.  Your primary care provider can consider running outpatient testing for the diarrhea as you were unable to provide a sample here.  There is a nodule noticed in your left lung on the CT scan today.  You will need a repeat CT scan of your lungs in about 6-12 months.  Otherwise be sure to eat and drink appropriately and follow-up with your primary care provider.  If you develop fever, new or worsening or uncontrolled diarrhea, abdominal pain, blood in the diarrhea, or any other new/concerning symptoms then return to the ER.

## 2023-11-18 NOTE — ED Triage Notes (Signed)
 Pt bib EMS from [facility]. Nausea and diarrhea for about 24 hrs. Denies vomiting. A&Ox1.

## 2023-11-21 DIAGNOSIS — I129 Hypertensive chronic kidney disease with stage 1 through stage 4 chronic kidney disease, or unspecified chronic kidney disease: Secondary | ICD-10-CM | POA: Diagnosis not present

## 2023-11-21 DIAGNOSIS — M170 Bilateral primary osteoarthritis of knee: Secondary | ICD-10-CM | POA: Diagnosis not present

## 2023-11-21 DIAGNOSIS — H409 Unspecified glaucoma: Secondary | ICD-10-CM | POA: Diagnosis not present

## 2023-11-21 DIAGNOSIS — N182 Chronic kidney disease, stage 2 (mild): Secondary | ICD-10-CM | POA: Diagnosis not present

## 2023-11-21 DIAGNOSIS — Z87891 Personal history of nicotine dependence: Secondary | ICD-10-CM | POA: Diagnosis not present

## 2023-11-21 DIAGNOSIS — E039 Hypothyroidism, unspecified: Secondary | ICD-10-CM | POA: Diagnosis not present

## 2023-11-21 DIAGNOSIS — E785 Hyperlipidemia, unspecified: Secondary | ICD-10-CM | POA: Diagnosis not present

## 2023-11-21 DIAGNOSIS — E559 Vitamin D deficiency, unspecified: Secondary | ICD-10-CM | POA: Diagnosis not present

## 2023-11-21 DIAGNOSIS — K8689 Other specified diseases of pancreas: Secondary | ICD-10-CM | POA: Diagnosis not present

## 2023-11-21 DIAGNOSIS — Z853 Personal history of malignant neoplasm of breast: Secondary | ICD-10-CM | POA: Diagnosis not present

## 2023-11-21 DIAGNOSIS — Z9181 History of falling: Secondary | ICD-10-CM | POA: Diagnosis not present

## 2023-11-23 DIAGNOSIS — E119 Type 2 diabetes mellitus without complications: Secondary | ICD-10-CM | POA: Diagnosis not present

## 2023-11-23 DIAGNOSIS — E039 Hypothyroidism, unspecified: Secondary | ICD-10-CM | POA: Diagnosis not present

## 2023-11-23 DIAGNOSIS — I129 Hypertensive chronic kidney disease with stage 1 through stage 4 chronic kidney disease, or unspecified chronic kidney disease: Secondary | ICD-10-CM | POA: Diagnosis not present

## 2023-11-23 DIAGNOSIS — N182 Chronic kidney disease, stage 2 (mild): Secondary | ICD-10-CM | POA: Diagnosis not present

## 2023-11-23 DIAGNOSIS — R410 Disorientation, unspecified: Secondary | ICD-10-CM | POA: Diagnosis not present

## 2023-11-27 DIAGNOSIS — H353221 Exudative age-related macular degeneration, left eye, with active choroidal neovascularization: Secondary | ICD-10-CM | POA: Diagnosis not present

## 2023-11-28 DIAGNOSIS — E559 Vitamin D deficiency, unspecified: Secondary | ICD-10-CM | POA: Diagnosis not present

## 2023-11-28 DIAGNOSIS — E785 Hyperlipidemia, unspecified: Secondary | ICD-10-CM | POA: Diagnosis not present

## 2023-11-28 DIAGNOSIS — Z87891 Personal history of nicotine dependence: Secondary | ICD-10-CM | POA: Diagnosis not present

## 2023-11-28 DIAGNOSIS — M170 Bilateral primary osteoarthritis of knee: Secondary | ICD-10-CM | POA: Diagnosis not present

## 2023-11-28 DIAGNOSIS — E039 Hypothyroidism, unspecified: Secondary | ICD-10-CM | POA: Diagnosis not present

## 2023-11-28 DIAGNOSIS — Z9181 History of falling: Secondary | ICD-10-CM | POA: Diagnosis not present

## 2023-11-28 DIAGNOSIS — Z853 Personal history of malignant neoplasm of breast: Secondary | ICD-10-CM | POA: Diagnosis not present

## 2023-11-28 DIAGNOSIS — K8689 Other specified diseases of pancreas: Secondary | ICD-10-CM | POA: Diagnosis not present

## 2023-11-28 DIAGNOSIS — N182 Chronic kidney disease, stage 2 (mild): Secondary | ICD-10-CM | POA: Diagnosis not present

## 2023-11-28 DIAGNOSIS — I129 Hypertensive chronic kidney disease with stage 1 through stage 4 chronic kidney disease, or unspecified chronic kidney disease: Secondary | ICD-10-CM | POA: Diagnosis not present

## 2023-11-28 DIAGNOSIS — H409 Unspecified glaucoma: Secondary | ICD-10-CM | POA: Diagnosis not present

## 2023-12-02 DIAGNOSIS — Z853 Personal history of malignant neoplasm of breast: Secondary | ICD-10-CM | POA: Diagnosis not present

## 2023-12-03 ENCOUNTER — Emergency Department (HOSPITAL_COMMUNITY)

## 2023-12-03 ENCOUNTER — Observation Stay (HOSPITAL_COMMUNITY)
Admission: EM | Admit: 2023-12-03 | Discharge: 2023-12-05 | Disposition: A | Discharge status: Home / Self Care | Source: Skilled Nursing Facility | Attending: Internal Medicine | Admitting: Internal Medicine

## 2023-12-03 ENCOUNTER — Encounter (HOSPITAL_COMMUNITY): Payer: Self-pay | Admitting: Internal Medicine

## 2023-12-03 ENCOUNTER — Other Ambulatory Visit: Payer: Self-pay

## 2023-12-03 DIAGNOSIS — R918 Other nonspecific abnormal finding of lung field: Secondary | ICD-10-CM | POA: Insufficient documentation

## 2023-12-03 DIAGNOSIS — M6281 Muscle weakness (generalized): Secondary | ICD-10-CM | POA: Insufficient documentation

## 2023-12-03 DIAGNOSIS — E441 Mild protein-calorie malnutrition: Secondary | ICD-10-CM | POA: Diagnosis not present

## 2023-12-03 DIAGNOSIS — Z87891 Personal history of nicotine dependence: Secondary | ICD-10-CM | POA: Insufficient documentation

## 2023-12-03 DIAGNOSIS — R945 Abnormal results of liver function studies: Secondary | ICD-10-CM | POA: Diagnosis not present

## 2023-12-03 DIAGNOSIS — J309 Allergic rhinitis, unspecified: Secondary | ICD-10-CM | POA: Insufficient documentation

## 2023-12-03 DIAGNOSIS — I10 Essential (primary) hypertension: Secondary | ICD-10-CM | POA: Diagnosis not present

## 2023-12-03 DIAGNOSIS — H409 Unspecified glaucoma: Secondary | ICD-10-CM | POA: Diagnosis not present

## 2023-12-03 DIAGNOSIS — G3184 Mild cognitive impairment, so stated: Secondary | ICD-10-CM | POA: Diagnosis not present

## 2023-12-03 DIAGNOSIS — R2681 Unsteadiness on feet: Secondary | ICD-10-CM | POA: Diagnosis not present

## 2023-12-03 DIAGNOSIS — N390 Urinary tract infection, site not specified: Secondary | ICD-10-CM | POA: Diagnosis not present

## 2023-12-03 DIAGNOSIS — F331 Major depressive disorder, recurrent, moderate: Secondary | ICD-10-CM | POA: Insufficient documentation

## 2023-12-03 DIAGNOSIS — J189 Pneumonia, unspecified organism: Secondary | ICD-10-CM | POA: Diagnosis not present

## 2023-12-03 DIAGNOSIS — R2689 Other abnormalities of gait and mobility: Secondary | ICD-10-CM | POA: Insufficient documentation

## 2023-12-03 DIAGNOSIS — E785 Hyperlipidemia, unspecified: Secondary | ICD-10-CM | POA: Diagnosis not present

## 2023-12-03 DIAGNOSIS — Z743 Need for continuous supervision: Secondary | ICD-10-CM | POA: Diagnosis not present

## 2023-12-03 DIAGNOSIS — R4189 Other symptoms and signs involving cognitive functions and awareness: Secondary | ICD-10-CM | POA: Diagnosis present

## 2023-12-03 DIAGNOSIS — K861 Other chronic pancreatitis: Secondary | ICD-10-CM | POA: Diagnosis not present

## 2023-12-03 DIAGNOSIS — R739 Hyperglycemia, unspecified: Secondary | ICD-10-CM | POA: Insufficient documentation

## 2023-12-03 DIAGNOSIS — E039 Hypothyroidism, unspecified: Secondary | ICD-10-CM | POA: Diagnosis not present

## 2023-12-03 DIAGNOSIS — R109 Unspecified abdominal pain: Secondary | ICD-10-CM | POA: Diagnosis not present

## 2023-12-03 DIAGNOSIS — N1330 Unspecified hydronephrosis: Secondary | ICD-10-CM | POA: Diagnosis not present

## 2023-12-03 DIAGNOSIS — K5730 Diverticulosis of large intestine without perforation or abscess without bleeding: Secondary | ICD-10-CM | POA: Diagnosis not present

## 2023-12-03 DIAGNOSIS — M8580 Other specified disorders of bone density and structure, unspecified site: Secondary | ICD-10-CM | POA: Insufficient documentation

## 2023-12-03 DIAGNOSIS — J929 Pleural plaque without asbestos: Secondary | ICD-10-CM | POA: Diagnosis not present

## 2023-12-03 DIAGNOSIS — K8689 Other specified diseases of pancreas: Secondary | ICD-10-CM | POA: Diagnosis not present

## 2023-12-03 DIAGNOSIS — Z91040 Latex allergy status: Secondary | ICD-10-CM | POA: Diagnosis not present

## 2023-12-03 DIAGNOSIS — R110 Nausea: Secondary | ICD-10-CM | POA: Diagnosis not present

## 2023-12-03 DIAGNOSIS — J9811 Atelectasis: Secondary | ICD-10-CM | POA: Diagnosis not present

## 2023-12-03 DIAGNOSIS — J90 Pleural effusion, not elsewhere classified: Secondary | ICD-10-CM | POA: Diagnosis not present

## 2023-12-03 DIAGNOSIS — R7989 Other specified abnormal findings of blood chemistry: Secondary | ICD-10-CM | POA: Diagnosis present

## 2023-12-03 LAB — CBC WITH DIFFERENTIAL/PLATELET
Abs Immature Granulocytes: 0.06 K/uL (ref 0.00–0.07)
Basophils Absolute: 0 K/uL (ref 0.0–0.1)
Basophils Relative: 0 %
Eosinophils Absolute: 0 K/uL (ref 0.0–0.5)
Eosinophils Relative: 0 %
HCT: 44.4 % (ref 36.0–46.0)
Hemoglobin: 14.4 g/dL (ref 12.0–15.0)
Immature Granulocytes: 1 %
Lymphocytes Relative: 13 %
Lymphs Abs: 1.6 K/uL (ref 0.7–4.0)
MCH: 30.9 pg (ref 26.0–34.0)
MCHC: 32.4 g/dL (ref 30.0–36.0)
MCV: 95.3 fL (ref 80.0–100.0)
Monocytes Absolute: 0.6 K/uL (ref 0.1–1.0)
Monocytes Relative: 5 %
Neutro Abs: 9.4 K/uL — ABNORMAL HIGH (ref 1.7–7.7)
Neutrophils Relative %: 81 %
Platelets: 253 K/uL (ref 150–400)
RBC: 4.66 MIL/uL (ref 3.87–5.11)
RDW: 12.4 % (ref 11.5–15.5)
WBC: 11.7 K/uL — ABNORMAL HIGH (ref 4.0–10.5)
nRBC: 0 % (ref 0.0–0.2)

## 2023-12-03 LAB — COMPREHENSIVE METABOLIC PANEL WITH GFR
ALT: 25 U/L (ref 0–44)
AST: 46 U/L — ABNORMAL HIGH (ref 15–41)
Albumin: 3.4 g/dL — ABNORMAL LOW (ref 3.5–5.0)
Alkaline Phosphatase: 221 U/L — ABNORMAL HIGH (ref 38–126)
Anion gap: 12 (ref 5–15)
BUN: 14 mg/dL (ref 8–23)
CO2: 28 mmol/L (ref 22–32)
Calcium: 9.6 mg/dL (ref 8.9–10.3)
Chloride: 100 mmol/L (ref 98–111)
Creatinine, Ser: 1.14 mg/dL — ABNORMAL HIGH (ref 0.44–1.00)
GFR, Estimated: 44 mL/min — ABNORMAL LOW (ref >60)
Glucose, Bld: 212 mg/dL — ABNORMAL HIGH (ref 70–99)
Potassium: 4 mmol/L (ref 3.5–5.1)
Sodium: 140 mmol/L (ref 135–145)
Total Bilirubin: 1 mg/dL (ref 0.0–1.2)
Total Protein: 7.4 g/dL (ref 6.5–8.1)

## 2023-12-03 LAB — URINALYSIS, ROUTINE W REFLEX MICROSCOPIC
Bilirubin Urine: NEGATIVE
Glucose, UA: NEGATIVE mg/dL
Hgb urine dipstick: NEGATIVE
Ketones, ur: NEGATIVE mg/dL
Nitrite: POSITIVE — AB
Protein, ur: NEGATIVE mg/dL
Specific Gravity, Urine: 1.01 (ref 1.005–1.030)
pH: 6 (ref 5.0–8.0)

## 2023-12-03 LAB — RESP PANEL BY RT-PCR (RSV, FLU A&B, COVID)  RVPGX2
Influenza A by PCR: NEGATIVE
Influenza B by PCR: NEGATIVE
Resp Syncytial Virus by PCR: NEGATIVE
SARS Coronavirus 2 by RT PCR: NEGATIVE

## 2023-12-03 LAB — TROPONIN T, HIGH SENSITIVITY
Troponin T High Sensitivity: <15 ng/L (ref 0–19)
Troponin T High Sensitivity: <15 ng/L (ref 0–19)

## 2023-12-03 LAB — URINALYSIS, MICROSCOPIC (REFLEX)
RBC / HPF: NONE SEEN RBC/hpf (ref 0–5)
WBC, UA: >50 WBC/hpf (ref 0–5)

## 2023-12-03 LAB — LACTIC ACID, PLASMA
Lactic Acid, Venous: 1.7 mmol/L (ref 0.5–1.9)
Lactic Acid, Venous: 1.7 mmol/L (ref 0.5–1.9)

## 2023-12-03 LAB — LIPASE, BLOOD: Lipase: 22 U/L (ref 11–51)

## 2023-12-03 MED ORDER — SODIUM CHLORIDE 0.9 % IV SOLN
500.0000 mg | INTRAVENOUS | Status: DC
  Filled 2023-12-03: qty 5

## 2023-12-03 MED ORDER — ONDANSETRON HCL 4 MG/2ML IJ SOLN: 4.0000 mg | Freq: Four times a day (QID) | INTRAMUSCULAR | Status: DC | PRN

## 2023-12-03 MED ORDER — SODIUM CHLORIDE 0.9 % IV SOLN
500.0000 mg | Freq: Once | INTRAVENOUS | Status: AC
  Administered 2023-12-03: 500 mg via INTRAVENOUS
  Filled 2023-12-03: qty 5

## 2023-12-03 MED ORDER — VITAMIN D 25 MCG (1000 UNIT) PO TABS
5000.0000 [IU] | ORAL_TABLET | Freq: Every day | ORAL | Status: DC
  Administered 2023-12-04 – 2023-12-05 (×2): 5000 [IU] via ORAL
  Filled 2023-12-03 (×2): qty 5

## 2023-12-03 MED ORDER — LEVOTHYROXINE SODIUM 125 MCG PO TABS
125.0000 ug | ORAL_TABLET | Freq: Every day | ORAL | Status: DC
  Administered 2023-12-04 – 2023-12-05 (×2): 125 ug via ORAL
  Filled 2023-12-03 (×2): qty 1

## 2023-12-03 MED ORDER — SODIUM CHLORIDE 0.9 % IV SOLN
1.0000 g | INTRAVENOUS | Status: DC
  Administered 2023-12-04: 1 g via INTRAVENOUS
  Filled 2023-12-03: qty 10

## 2023-12-03 MED ORDER — QUETIAPINE FUMARATE 25 MG PO TABS
12.5000 mg | ORAL_TABLET | Freq: Every day | ORAL | Status: DC
  Administered 2023-12-03 – 2023-12-04 (×2): 12.5 mg via ORAL
  Filled 2023-12-03 (×2): qty 1

## 2023-12-03 MED ORDER — SODIUM CHLORIDE 0.9 % IV SOLN: INTRAVENOUS | Status: AC

## 2023-12-03 MED ORDER — LIDOCAINE 5 % EX PTCH
1.0000 | MEDICATED_PATCH | Freq: Every morning | CUTANEOUS | Status: DC
  Administered 2023-12-04: 1 via TRANSDERMAL
  Filled 2023-12-03: qty 1

## 2023-12-03 MED ORDER — ACETAMINOPHEN 325 MG PO TABS
650.0000 mg | ORAL_TABLET | Freq: Four times a day (QID) | ORAL | Status: DC | PRN
  Administered 2023-12-03 – 2023-12-04 (×2): 650 mg via ORAL
  Filled 2023-12-03 (×2): qty 2

## 2023-12-03 MED ORDER — IOHEXOL 300 MG/ML  SOLN
80.0000 mL | Freq: Once | INTRAMUSCULAR | Status: AC | PRN
  Administered 2023-12-03: 75 mL via INTRAVENOUS

## 2023-12-03 MED ORDER — ENOXAPARIN SODIUM 40 MG/0.4ML IJ SOSY
40.0000 mg | PREFILLED_SYRINGE | INTRAMUSCULAR | Status: DC
  Administered 2023-12-03: 40 mg via SUBCUTANEOUS
  Filled 2023-12-03: qty 0.4

## 2023-12-03 MED ORDER — BUSPIRONE HCL 5 MG PO TABS
5.0000 mg | ORAL_TABLET | Freq: Three times a day (TID) | ORAL | Status: DC
  Administered 2023-12-03 – 2023-12-05 (×5): 5 mg via ORAL
  Filled 2023-12-03 (×6): qty 1

## 2023-12-03 MED ORDER — PANCRELIPASE (LIP-PROT-AMYL) 12000-38000 UNITS PO CPEP
24000.0000 [IU] | ORAL_CAPSULE | Freq: Three times a day (TID) | ORAL | Status: DC
  Administered 2023-12-04 – 2023-12-05 (×5): 24000 [IU] via ORAL
  Filled 2023-12-03 (×4): qty 2

## 2023-12-03 MED ORDER — TIMOLOL MALEATE 0.5 % OP SOLN
1.0000 [drp] | Freq: Two times a day (BID) | OPHTHALMIC | Status: DC
  Administered 2023-12-03 – 2023-12-04 (×2): 1 [drp] via OPHTHALMIC
  Filled 2023-12-03 (×2): qty 5

## 2023-12-03 MED ORDER — SODIUM CHLORIDE 0.9 % IV SOLN
1.0000 g | Freq: Once | INTRAVENOUS | Status: AC
  Administered 2023-12-03: 1 g via INTRAVENOUS
  Filled 2023-12-03: qty 10

## 2023-12-03 MED ORDER — ACETAMINOPHEN 650 MG RE SUPP: 650.0000 mg | Freq: Four times a day (QID) | RECTAL | Status: DC | PRN

## 2023-12-03 MED ORDER — GUAIFENESIN ER 600 MG PO TB12
600.0000 mg | ORAL_TABLET | Freq: Two times a day (BID) | ORAL | Status: DC
  Administered 2023-12-03 – 2023-12-05 (×4): 600 mg via ORAL
  Filled 2023-12-03 (×4): qty 1

## 2023-12-03 MED ORDER — ONDANSETRON HCL 4 MG PO TABS
4.0000 mg | ORAL_TABLET | Freq: Four times a day (QID) | ORAL | Status: DC | PRN
Start: 2023-12-03 — End: 2023-12-05

## 2023-12-03 MED ORDER — SENNOSIDES-DOCUSATE SODIUM 8.6-50 MG PO TABS: 1.0000 | ORAL_TABLET | Freq: Every day | ORAL | Status: DC | PRN

## 2023-12-03 MED ORDER — LATANOPROST 0.005 % OP SOLN
1.0000 [drp] | Freq: Every day | OPHTHALMIC | Status: DC
  Administered 2023-12-03: 1 [drp] via OPHTHALMIC
  Filled 2023-12-03: qty 2.5

## 2023-12-03 MED ORDER — SERTRALINE HCL 50 MG PO TABS
50.0000 mg | ORAL_TABLET | Freq: Every day | ORAL | Status: DC
  Administered 2023-12-04 – 2023-12-05 (×2): 50 mg via ORAL
  Filled 2023-12-03 (×2): qty 1

## 2023-12-03 MED ORDER — POLYETHYLENE GLYCOL 3350 17 GM/SCOOP PO POWD: 17.0000 g | Freq: Every day | ORAL | Status: DC | PRN

## 2023-12-03 MED ORDER — BISACODYL 5 MG PO TBEC: 5.0000 mg | DELAYED_RELEASE_TABLET | Freq: Every day | ORAL | Status: DC | PRN

## 2023-12-03 NOTE — H&P (Signed)
 History and Physical    Patient: Kristin Bryant FMW:[id number] DOB: 10-02-1922 DOA: 12/03/2023 DOS: the patient was seen and examined on 12/03/2023 PCP: Melony Ruth, NP  Patient coming from: SNF  Chief Complaint:  Chief Complaint  Patient presents with   Abdominal Pain   Emesis   HPI: Kristin Bryant is a 89 y.o. female with medical history significant of breast cancer, chronic pancreatitis, hyperlipidemia, hypertension, hypothyroidism, osteoporosis, thoracic compression fracture who was sent from her skilled nursing facility to the emergency department due to abdominal pain, nausea and emesis.  She has some mild dysuria and frequency.   No diarrhea, constipation, melena or hematochezia.  No flank pain or hematuria. She denied fever, chills, rhinorrhea, sore throat, wheezing or hemoptysis.  No chest pain, palpitations, diaphoresis, PND, orthopnea or pitting edema of the lower extremities.   No polyuria, polydipsia, polyphagia or blurred vision.   ED course: Initial vital signs were temperature 98.1 F, pulse 92, respiration 16, BP 140/73 mmHg O2 sat 96% on room air.  The patient received azithromycin  500 mg and ceftriaxone  1 g IVPB.  Lab work: Urinalysis show positive nitrites, positive leukocyte esterase, greater than 50 WBC many bacteria and positive budding yeast.  Coronavirus, influenza and RSV PCR test was negative.  CBC showed a white count of 11.7, hemoglobin 14.4 g/dL and platelets 746.  Lactic acid x 2, troponin x 2 and lipase level were normal.  CMP showed normal electrolytes.  Glucose 212, BUN 14 and creatinine 1.14 mg/dL.  Total protein 7.4 and albumin 3.4 g/dL, AST 46, ALT 25 and alkaline phosphatase 221 units/L with a normal bilirubin level.  Imaging: Portable 1 view chest radiograph showing elevated right hemidiaphragm with right base scaring/atelectasis and trace right pleural effusion.  Peripheral right midlung airspace opacity.  CT abdomen/pelvis with contrast showing new moderate left  hydronephrosis and perinephric stranding.  The distal left ureter tapers without visible obstructing stone.  As such because of the hydronephrosis is unknown.  Trace right pleural effusion with bibasilar opacities, right greater than left, favor atelectasis.  Aortic atherosclerosis.  Colonic diverticulosis.   Review of Systems: As mentioned in the history of present illness. All other systems reviewed and are negative. Past Medical History:  Diagnosis Date   Breast cancer (HCC) 12/2018   right   Chronic pancreatitis (HCC) 01/18/2022   Elevated cholesterol    Hypertension    Hypothyroidism    Osteoporosis    Thoracic compression fracture (HCC) 01/18/2022   Past Surgical History:  Procedure Laterality Date   APPENDECTOMY     BACK SURGERY     CATARACT EXTRACTION     VAGINAL HYSTERECTOMY     Social History:  reports that she has quit smoking. Her smoking use included cigarettes. She has never used smokeless tobacco. No history on file for alcohol use and drug use.  Allergies  Allergen Reactions   Aspirin Other (See Comments)    Reaction not listed on the MAR   Benzonatate Nausea Only   Celexa [Citalopram]     Allergy not listed on MAR    Chlorzoxazone Other (See Comments)    Reaction not listed on the [facility]   Cortisone Other (See Comments)     Infection. Allergy not listed on MAR    Diclofenac-Misoprostol     Allergy not listed on MAR    Escitalopram Oxalate     Allergy not listed on MAR    Guaifenesin  Other (See Comments)    Reaction not listed on the  MAR   Guaifenesin  & Derivatives Nausea Only   Hydrocodone  Other (See Comments)    Reaction not listed on the MAR   Ipratropium Bromide Nausea Only    Allergy not listed on MAR    [facility] Majalis] Other (See Comments)    Reaction not listed on the [facility]   Mirtazapine Other (See Comments)    Passing out. Allergy not listed on MAR     Naproxen     Allergy not listed on MAR    Neosporin  [Bacitracin-Polymyxin B]     Allergy not listed on MAR    Prednisone     Allergy not listed on MAR    Pseudoephedrine Other (See Comments)    Reaction not listed on the MAR   Ranitidine Nausea Only and Other (See Comments)    Stomach upset. Allergy not listed on MAR    Ranitidine Hcl Other (See Comments)     stomach upset, nausea. Allergy not listed on MAR    Sulfa Antibiotics Other (See Comments)    Reaction not listed on the [facility]   Tylenol  [Acetaminophen ]    Epinephrine Palpitations    Rapid heart rate   Latex Rash    Family History  Problem Relation Age of Onset   Stomach cancer Father    Breast cancer Sister    Prostate cancer Brother    Diabetes Son     Prior to Admission medications   Medication Sig Start Date End Date Taking? Authorizing Provider  acetaminophen  (TYLENOL ) 325 MG tablet Take 650 mg by mouth every 8 (eight) hours as needed for mild pain (pain score 1-3) or moderate pain (pain score 4-6).    [provider]  bisacodyl  (DULCOLAX) 5 MG EC tablet Take 5 mg by mouth daily as needed for moderate constipation.    [provider]  busPIRone  (BUSPAR ) 5 MG tablet Take 5 mg by mouth 3 (three) times daily. 01/31/23   [provider]  Cholecalciferol  (VITAMIN D -3) 125 MCG (5000 UT) TABS Take 5,000 Units by mouth daily.    [provider]  doxycycline  (VIBRAMYCIN ) 100 MG capsule Take 1 capsule (100 mg total) by mouth 2 (two) times daily. 09/25/23   Armenta Canning, MD  latanoprost  (XALATAN ) 0.005 % ophthalmic solution Place 1 drop into both eyes at bedtime.    [provider]  levothyroxine  (SYNTHROID ) 125 MCG tablet Take 125 mcg by mouth daily. 12/08/21   [provider]  lidocaine  4 % Place 1 patch onto the skin in the morning. To right ankle, lower back.    [provider]  lipase/protease/amylase 24000-76000 units CPEP Take 1 capsule (24,000 Units total) by mouth 3 (three) times daily before meals. 01/20/22    Jadine Toribio SQUIBB, MD  Multiple Vitamins-Minerals (PRESERVISION AREDS 2 PO) Take 1 capsule by mouth daily.    [provider]  ondansetron  (ZOFRAN -ODT) 4 MG disintegrating tablet Take 1 tablet (4 mg total) by mouth every 8 (eight) hours as needed. 02/18/23   Franklyn Sid SAILOR, MD  polyethylene glycol powder (GLYCOLAX /MIRALAX ) 17 GM/SCOOP powder Take 17 g by mouth in the morning and at bedtime. Patient taking differently: Take 17 g by mouth daily as needed for mild constipation or moderate constipation. 01/20/22   Jadine Toribio SQUIBB, MD  QUEtiapine  (SEROQUEL ) 25 MG tablet Take 12.5 mg by mouth at bedtime. 12/08/21   [provider]  senna-docusate (SENOKOT-S) 8.6-50 MG tablet Take 1 tablet by mouth daily as needed for  mild constipation or moderate constipation.    [provider]  sertraline  (ZOLOFT ) 50 MG tablet Take 50 mg by mouth daily. 12/08/21   [provider]  timolol  (TIMOPTIC ) 0.5 % ophthalmic solution Place 1 drop into both eyes 2 (two) times daily.    [provider]    Physical Exam: Vitals:   12/03/23 0957 12/03/23 1145 12/03/23 1313  BP: (!) 140/73 (!) 189/65 119/76  Pulse: 92 (!) 105 (!) 101  Resp: 16 19 20   Temp: 98.1 F (36.7 C)  98.9 F (37.2 C)  TempSrc: Oral  Oral  SpO2: 96% 100% 97%   Physical Exam Vitals and nursing note reviewed.  Constitutional:      General: She is awake. She is not in acute distress.    Appearance: She is well-developed. She is ill-appearing.  HENT:     Head: Normocephalic.     Nose: No rhinorrhea.     Mouth/Throat:     Mouth: Mucous membranes are dry.  Eyes:     General: No scleral icterus.    Pupils: Pupils are equal, round, and reactive to light.  Neck:     Vascular: No JVD.  Cardiovascular:     Rate and Rhythm: Normal rate and regular rhythm.     Heart sounds: S1 normal and S2 normal.  Pulmonary:     Effort: Pulmonary effort is normal.     Breath sounds: Normal breath sounds. No wheezing,  rhonchi or rales.  Abdominal:     General: Bowel sounds are normal. There is no distension.     Palpations: Abdomen is soft.     Tenderness: There is abdominal tenderness. There is no right CVA tenderness or left CVA tenderness.  Musculoskeletal:     Cervical back: Neck supple.     Right lower leg: No edema.     Left lower leg: No edema.  Skin:    General: Skin is warm and dry.  Neurological:     General: No focal deficit present.     Mental Status: She is alert. Mental status is at baseline.     Motor: Tremor present.  Psychiatric:        Mood and Affect: Mood normal.        Behavior: Behavior normal. Behavior is cooperative.     Data Reviewed:  Results are pending, will review when available.  EKG: Vent. rate 88 BPM PR interval * ms QRS duration 66 ms QT/QTcB 368/446 ms P-R-T axes * -56 * Atrial fibrillation Inferior infarct, old Probable anterior infarct, age indeterminate  Assessment and Plan: Principal Problem:   Acute UTI (urinary tract infection)   Pulmonary infiltrates  Admit to telemetry/inpatient. As needed bronchodilators. Continue ceftriaxone  1 g IVPB daily. Continue azithromycin  500 mg IVPB daily. Follow-up urine culture and sensitivity. Follow-up blood culture and sensitivity. Follow-up CBC and chemistry in the morning.  Active Problems:   Hyperglycemia Check fasting glucose. Check hemoglobin A1c.    Mild protein malnutrition (HCC) May benefit from protein supplementation. Consider nutritional services evaluation. Follow-up albumin level.    Abnormal LFTs Recheck LFTs after hydration in AM.    HLD (hyperlipidemia) Currently not on medical therapy Follow-up primary care provider.    Elevated serum creatinine Does not meet criteria for AKI. At time limited IV hydration. Follow-up creatinine level in AM.    HTN (hypertension) Currently not on antihypertensives. Monitor blood pressure.    Hypothyroidism Continue levothyroxine  125 mcg  p.o. daily.    Chronic pancreatitis (HCC) Continue  pancreatic enzymes supplementation 3 times daily AC.    Major depressive disorder, recurrent, moderate (HCC) Continue Seroquel  12.5 mg p.o. bedtime. Continue sertraline  50 mg p.o. bedtime. Continue buspirone  5 mg p.o. 3 times daily.    Cognitive impairment Supportive care.    Glaucoma Continue timolol  drops.    Advance Care Planning:   Code Status: Limited: Do not attempt resuscitation (DNR) -DNR-LIMITED -Do Not Intubate/DNI    Consults:   Family Communication: Her son was at bedside.  Severity of Illness: The appropriate patient status for this patient is INPATIENT. Inpatient status is judged to be reasonable and necessary in order to provide the required intensity of service to ensure the patient's safety. The patient's presenting symptoms, physical exam findings, and initial radiographic and laboratory data in the context of their chronic comorbidities is felt to place them at high risk for further clinical deterioration. Furthermore, it is not anticipated that the patient will be medically stable for discharge from the hospital within 2 midnights of admission.   * I certify that at the point of admission it is my clinical judgment that the patient will require inpatient hospital care spanning beyond 2 midnights from the point of admission due to high intensity of service, high risk for further deterioration and high frequency of surveillance required.*  Author: Alm Dorn Castor, MD 12/03/2023 1:39 PM  For on call review [email or web address].   This document was prepared using Dragon voice recognition software and may contain some unintended transcription errors.

## 2023-12-03 NOTE — ED Provider Notes (Addendum)
 [facility]TH FLOOR PROGRESSIVE CARE AND UROLOGY Provider Note   CSN: [id number] Arrival date & time: 12/03/23  9051     Patient presents with: Abdominal Pain and Emesis   Kristin Bryant is a 89 y.o. female.   89 year old presenting emergency department from facility with nausea vomiting and abdominal pain.  Poor historian.  Denies pain currently.  Not actively vomiting.  Does endorse some shortness of breath, but denies chest pain.   Abdominal Pain Associated symptoms: vomiting   Emesis Associated symptoms: abdominal pain        Prior to Admission medications   Medication Sig Start Date End Date Taking? Authorizing Provider  acetaminophen  (TYLENOL ) 325 MG tablet Take 650 mg by mouth every 8 (eight) hours as needed for mild pain (pain score 1-3) or moderate pain (pain score 4-6).   Yes [provider]  albuterol  (VENTOLIN  HFA) 108 (90 Base) MCG/ACT inhaler Inhale 2 puffs into the lungs every 4 (four) hours as needed for wheezing or shortness of breath.   Yes [provider]  antiseptic oral rinse (BIOTENE) LIQD 20 mLs by Mouth Rinse route 2 (two) times daily.   Yes [provider]  bisacodyl  (DULCOLAX) 5 MG EC tablet Take 5 mg by mouth daily as needed for moderate constipation.   Yes [provider]  busPIRone  (BUSPAR ) 5 MG tablet Take 5 mg by mouth 3 (three) times daily. 01/31/23  Yes [provider]  Cholecalciferol  (VITAMIN D -3) 125 MCG (5000 UT) TABS Take 5,000 Units by mouth daily.   Yes [provider]  guaiFENesin  (MUCINEX ) 600 MG 12 hr tablet Take 600 mg by mouth 2 (two) times daily.   Yes [provider]  latanoprost  (XALATAN ) 0.005 % ophthalmic solution Place 1 drop into both eyes at bedtime.   Yes [provider]  levothyroxine  (SYNTHROID ) 125 MCG tablet Take 125 mcg by mouth daily. 12/08/21  Yes [provider]  lidocaine  4 % Place 1 patch onto the skin in the morning. To right ankle, lower  back.   Yes [provider]  lipase/protease/amylase 24000-76000 units CPEP Take 1 capsule (24,000 Units total) by mouth 3 (three) times daily before meals. 01/20/22  Yes [name], MD  Multiple Vitamins-Minerals (PRESERVISION AREDS 2 PO) Take 1 capsule by mouth daily.   Yes [provider]  ondansetron  (ZOFRAN -ODT) 4 MG disintegrating tablet Take 1 tablet (4 mg total) by mouth every 8 (eight) hours as needed. 02/18/23  Yes Franklyn Sid SAILOR, MD  polyethylene glycol powder (GLYCOLAX /MIRALAX ) 17 GM/SCOOP powder Take 17 g by mouth in the morning and at bedtime. Patient taking differently: Take 17 g by mouth daily as needed for mild constipation or moderate constipation. 01/20/22  Yes [name], MD  QUEtiapine  (SEROQUEL ) 25 MG tablet Take 12.5 mg by mouth at bedtime. 12/08/21  Yes [provider]  senna-docusate (SENOKOT-S) 8.6-50 MG tablet Take 1 tablet by mouth daily as needed for mild constipation or moderate constipation.   Yes [provider]  sertraline  (ZOLOFT ) 50 MG tablet Take 50 mg by mouth daily. 12/08/21  Yes [provider]  timolol  (TIMOPTIC ) 0.5 % ophthalmic solution Place 1 drop into both eyes 2 (two) times daily.   Yes [provider]    Allergies: Aspirin, Benzonatate, Celexa [citalopram], Chlorzoxazone, Cortisone, Diclofenac-misoprostol, Escitalopram oxalate, Guaifenesin , Guaifenesin  & derivatives, Hydrocodone , Ipratropium bromide, [place] herb [convallariae majalis], Mirtazapine, Naproxen, Neosporin [bacitracin-polymyxin b], Prednisone, Pseudoephedrine, Ranitidine, Ranitidine hcl, Sulfa antibiotics, Tylenol  [acetaminophen ],  Epinephrine, and Latex    Review of Systems  Gastrointestinal:  Positive for abdominal pain and vomiting.    Updated Vital Signs BP 136/72 (BP Location: Left Arm)   Pulse 97   Temp 98.1 F (36.7 C) (Oral)   Resp (!) 21   SpO2 100%   Physical Exam Vitals and nursing note  reviewed.  Constitutional:      General: She is not in acute distress.    Appearance: She is not toxic-appearing.  HENT:     Head: Normocephalic.  Cardiovascular:     Rate and Rhythm: Normal rate and regular rhythm.  Pulmonary:     Effort: Pulmonary effort is normal.     Breath sounds: Normal breath sounds.  Abdominal:     General: Abdomen is flat.     Palpations: Abdomen is soft.     Tenderness: There is no abdominal tenderness.  Skin:    General: Skin is warm and dry.  Neurological:     General: No focal deficit present.     Mental Status: She is alert.  Psychiatric:        Mood and Affect: Mood normal.        Behavior: Behavior normal.     (all labs ordered are listed, but only abnormal results are displayed) Labs Reviewed  CBC WITH DIFFERENTIAL/PLATELET - Abnormal; Notable for the following components:      Result Value   WBC 11.7 (*)    Neutro Abs 9.4 (*)    All other components within normal limits  COMPREHENSIVE METABOLIC PANEL WITH GFR - Abnormal; Notable for the following components:   Glucose, Bld 212 (*)    Creatinine, Ser 1.14 (*)    Albumin 3.4 (*)    AST 46 (*)    Alkaline Phosphatase 221 (*)    GFR, Estimated 44 (*)    All other components within normal limits  URINALYSIS, ROUTINE W REFLEX MICROSCOPIC - Abnormal; Notable for the following components:   Nitrite POSITIVE (*)    Leukocytes,Ua MODERATE (*)    All other components within normal limits  URINALYSIS, MICROSCOPIC (REFLEX) - Abnormal; Notable for the following components:   Bacteria, UA MANY (*)    All other components within normal limits  RESP PANEL BY RT-PCR (RSV, FLU A&B, COVID)  [id number]  CULTURE, BLOOD (ROUTINE X 2)  CULTURE, BLOOD (ROUTINE X 2)  LIPASE, BLOOD  LACTIC ACID, PLASMA  LACTIC ACID, PLASMA  TROPONIN T, HIGH SENSITIVITY  TROPONIN T, HIGH SENSITIVITY    EKG: EKG Interpretation Date/Time:  Tuesday December 03 2023 10:22:54 EDT Ventricular Rate:  88 PR Interval:    QRS  Duration:  66 QT Interval:  368 QTC Calculation: 446 R Axis:   -56  Text Interpretation: Atrial fibrillation Inferior infarct, old Probable anterior infarct, age indeterminate Confirmed by Neysa Clap [phone]) on 12/03/2023 10:37:17 AM  Radiology: CT ABDOMEN PELVIS W CONTRAST Result Date: 12/03/2023 CLINICAL DATA:  Abdominal pain EXAM: CT ABDOMEN AND PELVIS WITH CONTRAST TECHNIQUE: Multidetector CT imaging of the abdomen and pelvis was performed using the standard protocol following bolus administration of intravenous contrast. RADIATION DOSE REDUCTION: This exam was performed according to the departmental dose-optimization program which includes automated exposure control, adjustment of the mA and/or kV according to patient size and/or use of iterative reconstruction technique. CONTRAST:  75mL OMNIPAQUE  IOHEXOL  300 MG/ML  SOLN COMPARISON:  11/18/2023 FINDINGS: Lower chest: Trace right pleural effusion. Bibasilar airspace opacities are noted, right greater than left, favor atelectasis. Coronary artery disease  and aortic atherosclerosis. Left basilar nodule again noted measuring 6 mm on image 21 compared to 7 mm previously, not significantly changed. Hepatobiliary: No focal hepatic abnormality. Gallbladder unremarkable. Pancreas: Diffuse atrophy. Pancreatic duct is dilated in the body and tail of to 5 mm. Dilatation is upstream from a pancreatic body stone in the pancreatic neck. Calcifications also noted in the pancreatic head. Findings are unchanged since prior study. Spleen: No focal abnormality.  Normal size. Adrenals/Urinary Tract: Left hydronephrosis and perinephric stranding. Left ureter is dilated into the pelvis where there is tapering to normal size in the distal ureter without visible obstructing process or stone. Calcification posterior to the bladder appears separate from the ureter and likely calcified phlebolith. No stones or hydronephrosis on the right. 5.4 cm cystic structure medial to the  upper pole of the left kidney is stable and likely reflects an exophytic cyst. Adrenal glands and urinary bladder unremarkable. Stomach/Bowel: Scattered colonic diverticulosis. No active diverticulitis. Moderate stool burden. Stomach and small bowel decompressed. No bowel obstruction or inflammatory process. Vascular/Lymphatic: Aortic atherosclerosis. No evidence of aneurysm or adenopathy. Reproductive: Prior hysterectomy.  No adnexal masses. Other: No free fluid or free air. Musculoskeletal: No acute bony abnormality. Moderate chronic T12 compression fracture, stable. Diffuse degenerative disc and facet disease. IMPRESSION: New moderate left hydronephrosis and perinephric stranding. The distal left ureter tapers without visible obstructing stone. Exact cause of the hydronephrosis unknown. Trace right pleural effusion with bibasilar opacities, right greater than left, favor atelectasis. Aortic atherosclerosis. Colonic diverticulosis. Electronically Signed   By: Franky Crease M.D.   On: 12/03/2023 12:22   DG Chest Portable 1 View Result Date: 12/03/2023 EXAM: 1 VIEW XRAY OF THE CHEST 12/03/2023 10:20:10 AM COMPARISON: 09/25/2023 CLINICAL HISTORY: N/v. N/v.; Unable to remove pt bra FINDINGS: LUNGS AND PLEURA: Peripheral right mid lung airspace opacity. Elevated right hemidiaphragm with right base scarring/atelectasis and trace right pleural effusion. Right pleural thickening. HEART AND MEDIASTINUM: No acute abnormality of the cardiac and mediastinal silhouettes. Atherosclerotic plaque. BONES AND SOFT TISSUES: No acute osseous abnormality. IMPRESSION: 1. Elevated right hemidiaphragm with right base scarring/atelectasis and trace right pleural effusion. 2. Peripheral right mid lung airspace opacity. Electronically signed by: Waddell Calk MD 12/03/2023 11:26 AM EDT RP Workstation: [id number]     Procedures   Medications Ordered in the ED  enoxaparin  (LOVENOX ) injection 40 mg (has no administration in time  range)  ondansetron  (ZOFRAN ) tablet 4 mg (has no administration in time range)    Or  ondansetron  (ZOFRAN ) injection 4 mg (has no administration in time range)  acetaminophen  (TYLENOL ) tablet 650 mg (has no administration in time range)    Or  acetaminophen  (TYLENOL ) suppository 650 mg (has no administration in time range)  iohexol  (OMNIPAQUE ) 300 MG/ML solution 80 mL (75 mLs Intravenous Contrast Given 12/03/23 1125)  cefTRIAXone  (ROCEPHIN ) 1 g in sodium chloride  0.9 % 100 mL IVPB (0 g Intravenous Stopped 12/03/23 1345)  azithromycin  (ZITHROMAX ) 500 mg in sodium chloride  0.9 % 250 mL IVPB (500 mg Intravenous New Bag/Given 12/03/23 1413)    Clinical Course as of 12/03/23 1543  Tue Dec 03, 2023  1222 DG Chest Portable 1 View IMPRESSION: 1. Elevated right hemidiaphragm with right base scarring/atelectasis and trace right pleural effusion. 2. Peripheral right mid lung airspace opacity.  Electronically signed by: Waddell Calk MD 12/03/2023 11:26 AM EDT RP Workstation: [id number]   [TY]  1313 CT ABDOMEN PELVIS W CONTRAST IMPRESSION: New moderate left hydronephrosis and perinephric stranding. The distal left ureter tapers without visible  obstructing stone. Exact cause of the hydronephrosis unknown.  Trace right pleural effusion with bibasilar opacities, right greater than left, favor atelectasis.  Aortic atherosclerosis.  Colonic diverticulosis.   Electronically Signed   By: Franky Crease M.D.   On: 12/03/2023 12:22   [TY]    Clinical Course User Index [TY] Neysa Caron PARAS, DO                                 Medical Decision Making This is a 89 year old female presenting emergency department for nausea vomiting.  Complex past medical history to include hypothyroid, hypertension hyperlipidemia, pancreatitis.  EMS reported stable vitals and patient at baseline mentation per facility.  Broad workup significant for UTI and pneumonia.  Receiving antibiotics.  Will admit for further  management of her pneumonia and UTI.  Amount and/or Complexity of Data Reviewed External Data Reviewed:     Details: Seen on 811 for diarrhea Labs: ordered. Radiology: ordered and independent interpretation performed. Decision-making details documented in ED Course.    Details: Do not appreciate free air on CT scan ECG/medicine tests: ordered and independent interpretation performed.    Details: No STEMI Discussion of management or test interpretation with external provider(s): Spoke with hospitalist for admission.  Risk Prescription drug management. Decision regarding hospitalization. Diagnosis or treatment significantly limited by social determinants of health. Risk Details: Lives at facility.  Advanced age.   Blood cultures ordered due to elevated HR and infectious source      Final diagnoses:  Pneumonia of right lung due to infectious organism, unspecified part of lung    ED Discharge Orders     None          Neysa Caron PARAS, DO 12/03/23 1543    Neysa Caron PARAS, DO 12/28/23 [phone]

## 2023-12-03 NOTE — Progress Notes (Signed)
 Pt alert to self only. Unable to finish admission assessment. No family member present at bedside.

## 2023-12-03 NOTE — ED Notes (Signed)
 Pt has been adjusted in the bed and nurse J. Prentiss, RN place pt on O2

## 2023-12-03 NOTE — ED Triage Notes (Signed)
 Per EMS from [facility]. Per staff, last night n/v, abdominal pain. Aox3 baseline. Noticeable tremors.  CBG 210 BP 145/85 96 on RA RR 18 HR 90

## 2023-12-04 DIAGNOSIS — N390 Urinary tract infection, site not specified: Secondary | ICD-10-CM | POA: Diagnosis present

## 2023-12-04 LAB — COMPREHENSIVE METABOLIC PANEL WITH GFR
ALT: 16 U/L (ref 0–44)
AST: 36 U/L (ref 15–41)
Albumin: 2.9 g/dL — ABNORMAL LOW (ref 3.5–5.0)
Alkaline Phosphatase: 186 U/L — ABNORMAL HIGH (ref 38–126)
Anion gap: 13 (ref 5–15)
BUN: 14 mg/dL (ref 8–23)
CO2: 21 mmol/L — ABNORMAL LOW (ref 22–32)
Calcium: 9 mg/dL (ref 8.9–10.3)
Chloride: 106 mmol/L (ref 98–111)
Creatinine, Ser: 1.22 mg/dL — ABNORMAL HIGH (ref 0.44–1.00)
GFR, Estimated: 40 mL/min — ABNORMAL LOW (ref >60)
Glucose, Bld: 153 mg/dL — ABNORMAL HIGH (ref 70–99)
Potassium: 3.8 mmol/L (ref 3.5–5.1)
Sodium: 140 mmol/L (ref 135–145)
Total Bilirubin: 0.8 mg/dL (ref 0.0–1.2)
Total Protein: 5.7 g/dL — ABNORMAL LOW (ref 6.5–8.1)

## 2023-12-04 LAB — BLOOD CULTURE ID PANEL (REFLEXED) - BCID2

## 2023-12-04 LAB — CBC
HCT: 39 % (ref 36.0–46.0)
Hemoglobin: 12.1 g/dL (ref 12.0–15.0)
MCH: 31.3 pg (ref 26.0–34.0)
MCHC: 31 g/dL (ref 30.0–36.0)
MCV: 101 fL — ABNORMAL HIGH (ref 80.0–100.0)
Platelets: 209 K/uL (ref 150–400)
RBC: 3.86 MIL/uL — ABNORMAL LOW (ref 3.87–5.11)
RDW: 12.5 % (ref 11.5–15.5)
WBC: 9.1 K/uL (ref 4.0–10.5)
nRBC: 0 % (ref 0.0–0.2)

## 2023-12-04 MED ORDER — AZITHROMYCIN 250 MG PO TABS
500.0000 mg | ORAL_TABLET | Freq: Every day | ORAL | Status: DC
  Administered 2023-12-04: 500 mg via ORAL
  Filled 2023-12-04: qty 1
  Filled 2023-12-04: qty 2

## 2023-12-04 MED ORDER — ENOXAPARIN SODIUM 30 MG/0.3ML IJ SOSY
30.0000 mg | PREFILLED_SYRINGE | INTRAMUSCULAR | Status: DC
  Filled 2023-12-04: qty 0.3

## 2023-12-04 NOTE — Evaluation (Signed)
 Physical Therapy Evaluation Patient Details Name: Kristin Bryant MRN: [id number] DOB: 1923/02/10 Today's Date: 12/04/2023  History of Present Illness  89 year old presented to the ER with abdominal pain, nausea and emesis with mild dysuria and frequency, from assisted living facility, with history of chronic parotitis, hypertension, hyperlipidemia, hypothyroidism  Clinical Impression  Pt admitted with above diagnosis.  Pt min/mod assist for bed mobility transfers, from [facility]. Anticipate pt will return to ALF.  Pt currently with functional limitations due to the deficits listed below (see PT Problem List). Pt will benefit from acute skilled PT to increase their independence and safety with mobility to allow discharge.           If plan is discharge home, recommend the following: Assist for transportation;Help with stairs or ramp for entrance   Can travel by private vehicle        Equipment Recommendations None recommended by PT  Recommendations for Other Services       Functional Status Assessment Patient has had a recent decline in their functional status and demonstrates the ability to make significant improvements in function in a reasonable and predictable amount of time.     Precautions / Restrictions Restrictions Weight Bearing Restrictions Per Provider Order: No      Mobility  Bed Mobility Overal bed mobility: Needs Assistance Bed Mobility: Sit to Supine     Supine to sit: Mod assist     General bed mobility comments: mod A to elevate trunk fully, assist to scoot to EOB    Transfers Overall transfer level: Needs assistance Equipment used: Rolling walker (2 wheels) Transfers: Sit to/from Stand, Bed to chair/wheelchair/BSC Sit to Stand: Min assist, +2 safety/equipment   Step pivot transfers: Min assist, +2 safety/equipment       General transfer comment: multi-modal cues to self assist STS and SPT    Ambulation/Gait               General Gait  Details: pt declined attempt to amb  Stairs            Wheelchair Mobility     Tilt Bed    Modified Rankin (Stroke Patients Only)       Balance   Sitting-balance support: Single extremity supported, Feet supported Sitting balance-Leahy Scale: Poor Sitting balance - Comments: needs one hand supported   Standing balance support: Bilateral upper extremity supported, During functional activity, Reliant on assistive device for balance Standing balance-Leahy Scale: Poor Standing balance comment: reliant on RW                             Pertinent Vitals/Pain Pain Assessment Pain Assessment: Faces Faces Pain Scale: Hurts a little bit Pain Location: generalized Pain Descriptors / Indicators: Discomfort, Aching Pain Intervention(s): Limited activity within patient's tolerance, Monitored during session, Premedicated before session    Home Living Family/patient expects to be discharged to:: Assisted living                   Additional Comments: from assisted living, plans to return    Prior Function Prior Level of Function : Needs assist             Mobility Comments: uses w/c, stand pivots ADLs Comments: likely needs help with ADLs     Extremity/Trunk Assessment   Upper Extremity Assessment Upper Extremity Assessment: Defer to OT evaluation;Generalized weakness    Lower Extremity Assessment Lower Extremity Assessment: Generalized weakness  Communication   Communication Communication: No apparent difficulties    Cognition Arousal: Alert Behavior During Therapy: WFL for tasks assessed/performed   PT - Cognitive impairments: History of cognitive impairments                         Following commands: Impaired Following commands impaired: Follows one step commands inconsistently, Follows one step commands with increased time     Cueing Cueing Techniques: Verbal cues, Gestural cues     General Comments       Exercises     Assessment/Plan    PT Assessment Patient needs continued PT services  PT Problem List Decreased strength;Decreased mobility;Decreased activity tolerance;Decreased balance;Decreased knowledge of use of DME       PT Treatment Interventions DME instruction;Therapeutic exercise;Therapeutic activities;Patient/family education;Functional mobility training;Gait training    PT Goals (Current goals can be found in the Care Plan section)  Acute Rehab PT Goals PT Goal Formulation: With patient Time For Goal Achievement: 12/18/23 Potential to Achieve Goals: Fair    Frequency Min 2X/week     Co-evaluation PT/OT/SLP Co-Evaluation/Treatment: Yes Reason for Co-Treatment: To address functional/ADL transfers PT goals addressed during session: Mobility/safety with mobility         AM-PAC PT 6 Clicks Mobility  Outcome Measure Help needed turning from your back to your side while in a flat bed without using bedrails?: A Little Help needed moving from lying on your back to sitting on the side of a flat bed without using bedrails?: A Little Help needed moving to and from a bed to a chair (including a wheelchair)?: A Little Help needed standing up from a chair using your arms (e.g., wheelchair or bedside chair)?: A Little Help needed to walk in hospital room?: A Little Help needed climbing 3-5 steps with a railing? : A Lot 6 Click Score: 17    End of Session Equipment Utilized During Treatment: Gait belt Activity Tolerance: Patient tolerated treatment well Patient left: with call bell/phone within reach;in chair;with chair alarm set Nurse Communication: Mobility status PT Visit Diagnosis: Other abnormalities of gait and mobility (R26.89)    Time: 8399-8384 PT Time Calculation (min) (ACUTE ONLY): 15 min   Charges:   PT Evaluation $PT Eval Low Complexity: 1 Low   PT General Charges $$ ACUTE PT VISIT: 1 Visit         Taras Rask, PT  Acute Rehab Dept ([facility])  [phone]  12/04/2023   [facility] 12/04/2023, 5:22 PM

## 2023-12-04 NOTE — Progress Notes (Signed)
 PROGRESS NOTE    Kristin Bryant  FMW:[id number] DOB: 04-01-1923 DOA: 12/03/2023 PCP: Melony Ruth, NP    Brief Narrative:  89 year old with history of chronic parotitis, hypertension, hyperlipidemia, hypothyroidism from assisted living facility presented to the ER with abdominal pain, nausea and emesis with mild dysuria and frequency.  In the emergency room afebrile.  On room air.  Positive urinalysis.  CT scan abdomen pelvis with contrast new moderate left hydronephrosis and perinephric stranding without visible obstruction.  Admitted with IV antibiotics.  Subjective: Patient seen and examined.  Pleasant and interactive.  Forgetful.  Denies any complaints.  She thinks he lives at home with her sister and brother.  She tells me she is independent and does not use any assistive device.  Remains afebrile.  Denies any dysuria or suprapubic pain today. Assessment & Plan:   Acute UTI present on admission Left-sided hydronephrosis without visible obstruction, passed a stone? Agree with admission given severity of symptoms.  Continue IV Rocephin  until final blood cultures and urine cultures. With no visible obstructive stone and medical stabilization, patient will not need any intervention for her hydronephrosis.  Will monitor.  Hypothyroidism: On Synthroid .  Chronic pancreatitis: On pancrelipase .  Major depressive disorder: Patient on Seroquel , sertraline  and BuSpar .  Continue.    DVT prophylaxis: enoxaparin  (LOVENOX ) injection 30 mg Start: 12/04/23 2200   Code Status: DNR with limited intervention Family Communication: None at the bedside Disposition Plan: Status is: Inpatient Remains inpatient appropriate because: IV antibiotics     Consultants:  None  Procedures:  None  Antimicrobials:  Rocephin  8/26--     Objective: Vitals:   12/03/23 2039 12/03/23 2326 12/04/23 0015 12/04/23 0314  BP: 123/60 (!) 112/57  (!) 98/54  Pulse: 90 86  98  Resp: 20 20    Temp: 98.2 F (36.8  C) 99.1 F (37.3 C)  98.2 F (36.8 C)  TempSrc: Oral Oral  Oral  SpO2: 97% 94%    Weight:    61 kg  Height:   5' 2 (1.575 m)     Intake/Output Summary (Last 24 hours) at 12/04/2023 1238 Last data filed at 12/04/2023 0654 Gross per 24 hour  Intake 838.97 ml  Output 300 ml  Net 538.97 ml   Filed Weights   12/04/23 0314  Weight: 61 kg    Examination:  General exam: Appears calm and comfortable.  Pleasant and interactive. Respiratory system: Clear to auscultation. Respiratory effort normal.  No added sounds. Cardiovascular system: S1 & S2 heard, RRR.  Gastrointestinal system: Soft.  Nontender.  Bowel sound present. Central nervous system: Alert and awake.  Oriented to herself and family.  Not oriented to time place and person. Extremities: Symmetric 5 x 5 power.    Data Reviewed: I have personally reviewed following labs and imaging studies  CBC: Recent Labs  Lab 12/03/23 1034 12/04/23 0524  WBC 11.7* 9.1  NEUTROABS 9.4*  --   HGB 14.4 12.1  HCT 44.4 39.0  MCV 95.3 101.0*  PLT 253 209   Basic Metabolic Panel: Recent Labs  Lab 12/03/23 1034 12/04/23 0524  NA 140 140  K 4.0 3.8  CL 100 106  CO2 28 21*  GLUCOSE 212* 153*  BUN 14 14  CREATININE 1.14* 1.22*  CALCIUM  9.6 9.0   GFR: Estimated Creatinine Clearance: 22.1 mL/min (A) (by C-G formula based on SCr of 1.22 mg/dL (H)). Liver Function Tests: Recent Labs  Lab 12/03/23 1034 12/04/23 0524  AST 46* 36  ALT 25 16  ALKPHOS 221* 186*  BILITOT 1.0 0.8  PROT 7.4 5.7*  ALBUMIN 3.4* 2.9*   Recent Labs  Lab 12/03/23 1034  LIPASE 22   No results for input(s): AMMONIA in the last 168 hours. Coagulation Profile: No results for input(s): INR, PROTIME in the last 168 hours. Cardiac Enzymes: No results for input(s): CKTOTAL, CKMB, CKMBINDEX, TROPONINI in the last 168 hours. BNP (last 3 results) No results for input(s): PROBNP in the last 8760 hours. HbA1C: No results for input(s):  HGBA1C in the last 72 hours. CBG: No results for input(s): GLUCAP in the last 168 hours. Lipid Profile: No results for input(s): CHOL, HDL, LDLCALC, TRIG, CHOLHDL, LDLDIRECT in the last 72 hours. Thyroid  Function Tests: No results for input(s): TSH, T4TOTAL, FREET4, T3FREE, THYROIDAB in the last 72 hours. Anemia Panel: No results for input(s): VITAMINB12, FOLATE, FERRITIN, TIBC, IRON, RETICCTPCT in the last 72 hours. Sepsis Labs: Recent Labs  Lab 12/03/23 1044 12/03/23 1159  LATICACIDVEN 1.7 1.7    Recent Results (from the past 240 hours)  Resp panel by RT-PCR (RSV, Flu A&B, Covid) Anterior Nasal Swab     Status: None   Collection Time: 12/03/23 11:42 AM   Specimen: Anterior Nasal Swab  Result Value Ref Range Status   SARS Coronavirus 2 by RT PCR NEGATIVE NEGATIVE Final    Comment: (NOTE) SARS-CoV-2 target nucleic acids are NOT DETECTED.  The SARS-CoV-2 RNA is generally detectable in upper respiratory specimens during the acute phase of infection. The lowest concentration of SARS-CoV-2 viral copies this assay can detect is 138 copies/mL. A negative result does not preclude SARS-Cov-2 infection and should not be used as the sole basis for treatment or other patient management decisions. A negative result may occur with  improper specimen collection/handling, submission of specimen other than nasopharyngeal swab, presence of viral mutation(s) within the areas targeted by this assay, and inadequate number of viral copies(<138 copies/mL). A negative result must be combined with clinical observations, patient history, and epidemiological information. The expected result is Negative.  Fact Sheet for Patients:  BloggerCourse.com  Fact Sheet for Healthcare Providers:  SeriousBroker.it  This test is no t yet approved or cleared by the [place]  FDA and  has been authorized for detection  and/or diagnosis of SARS-CoV-2 by FDA under an Emergency Use Authorization (EUA). This EUA will remain  in effect (meaning this test can be used) for the duration of the COVID-19 declaration under Section 564(b)(1) of the Act, 21 U.S.C.section 360bbb-3(b)(1), unless the authorization is terminated  or revoked sooner.       Influenza A by PCR NEGATIVE NEGATIVE Final   Influenza B by PCR NEGATIVE NEGATIVE Final    Comment: (NOTE) The Xpert Xpress SARS-CoV-2/FLU/RSV plus assay is intended as an aid in the diagnosis of influenza from Nasopharyngeal swab specimens and should not be used as a sole basis for treatment. Nasal washings and aspirates are unacceptable for Xpert Xpress SARS-CoV-2/FLU/RSV testing.  Fact Sheet for Patients: BloggerCourse.com  Fact Sheet for Healthcare Providers: SeriousBroker.it  This test is not yet approved or cleared by the [place]  FDA and has been authorized for detection and/or diagnosis of SARS-CoV-2 by FDA under an Emergency Use Authorization (EUA). This EUA will remain in effect (meaning this test can be used) for the duration of the COVID-19 declaration under Section 564(b)(1) of the Act, 21 U.S.C. section 360bbb-3(b)(1), unless the authorization is terminated or revoked.     Resp Syncytial Virus by PCR NEGATIVE NEGATIVE Final  Comment: (NOTE) Fact Sheet for Patients: BloggerCourse.com  Fact Sheet for Healthcare Providers: SeriousBroker.it  This test is not yet approved or cleared by the [place]  FDA and has been authorized for detection and/or diagnosis of SARS-CoV-2 by FDA under an Emergency Use Authorization (EUA). This EUA will remain in effect (meaning this test can be used) for the duration of the COVID-19 declaration under Section 564(b)(1) of the Act, 21 U.S.C. section 360bbb-3(b)(1), unless the authorization is terminated  or revoked.  Performed at [facility], [address], [place], [place] [address]   Culture, blood (routine x 2)     Status: None (Preliminary result)   Collection Time: 12/03/23 12:48 PM   Specimen: BLOOD LEFT FOREARM  Result Value Ref Range Status   Specimen Description   Final    BLOOD LEFT FOREARM Performed at [facility], [address] [place], [place] [address]    Special Requests   Final    BOTTLES DRAWN AEROBIC AND ANAEROBIC Blood Culture adequate volume Performed at [facility], [address] [place], [place] [address]    Culture   Final    NO GROWTH < 24 HOURS Performed at [facility], [address], [place], [place] [address]    Report Status PENDING  Incomplete  Culture, blood (routine x 2)     Status: None (Preliminary result)   Collection Time: 12/03/23  3:25 PM   Specimen: BLOOD RIGHT ARM  Result Value Ref Range Status   Specimen Description   Final    BLOOD RIGHT ARM Performed at [facility], [address] [place], [place] [address]    Special Requests   Final    BOTTLES DRAWN AEROBIC AND ANAEROBIC Blood Culture adequate volume Performed at [facility], [address] [place], [place] [address]    Culture   Final    NO GROWTH < 24 HOURS Performed at [facility], [address] [place], [place] [address]    Report Status PENDING  Incomplete         Radiology Studies: CT ABDOMEN PELVIS W CONTRAST Result Date: 12/03/2023 CLINICAL DATA:  Abdominal pain EXAM: CT ABDOMEN AND PELVIS WITH CONTRAST TECHNIQUE: Multidetector CT imaging of the abdomen and pelvis was performed using the standard protocol following bolus administration of intravenous contrast. RADIATION DOSE REDUCTION: This exam was performed according to the departmental dose-optimization program which includes automated exposure control, adjustment of the mA and/or kV according to patient size and/or  use of iterative reconstruction technique. CONTRAST:  75mL OMNIPAQUE  IOHEXOL  300 MG/ML  SOLN COMPARISON:  11/18/2023 FINDINGS: Lower chest: Trace right pleural effusion. Bibasilar airspace opacities are noted, right greater than left, favor atelectasis. Coronary artery disease and aortic atherosclerosis. Left basilar nodule again noted measuring 6 mm on image 21 compared to 7 mm previously, not significantly changed. Hepatobiliary: No focal hepatic abnormality. Gallbladder unremarkable. Pancreas: Diffuse atrophy. Pancreatic duct is dilated in the body and tail of to 5 mm. Dilatation is upstream from a pancreatic body stone in the pancreatic neck. Calcifications also noted in the pancreatic head. Findings are unchanged since prior study. Spleen: No focal abnormality.  Normal size. Adrenals/Urinary Tract: Left hydronephrosis and perinephric stranding. Left ureter is dilated into the pelvis where there is tapering to normal size in the distal ureter without visible obstructing process or stone. Calcification posterior to the bladder appears separate from the ureter and likely calcified phlebolith. No stones or hydronephrosis  on the right. 5.4 cm cystic structure medial to the upper pole of the left kidney is stable and likely reflects an exophytic cyst. Adrenal glands and urinary bladder unremarkable. Stomach/Bowel: Scattered colonic diverticulosis. No active diverticulitis. Moderate stool burden. Stomach and small bowel decompressed. No bowel obstruction or inflammatory process. Vascular/Lymphatic: Aortic atherosclerosis. No evidence of aneurysm or adenopathy. Reproductive: Prior hysterectomy.  No adnexal masses. Other: No free fluid or free air. Musculoskeletal: No acute bony abnormality. Moderate chronic T12 compression fracture, stable. Diffuse degenerative disc and facet disease. IMPRESSION: New moderate left hydronephrosis and perinephric stranding. The distal left ureter tapers without visible obstructing stone.  Exact cause of the hydronephrosis unknown. Trace right pleural effusion with bibasilar opacities, right greater than left, favor atelectasis. Aortic atherosclerosis. Colonic diverticulosis. Electronically Signed   By: Franky Crease M.D.   On: 12/03/2023 12:22   DG Chest Portable 1 View Result Date: 12/03/2023 EXAM: 1 VIEW XRAY OF THE CHEST 12/03/2023 10:20:10 AM COMPARISON: 09/25/2023 CLINICAL HISTORY: N/v. N/v.; Unable to remove pt bra FINDINGS: LUNGS AND PLEURA: Peripheral right mid lung airspace opacity. Elevated right hemidiaphragm with right base scarring/atelectasis and trace right pleural effusion. Right pleural thickening. HEART AND MEDIASTINUM: No acute abnormality of the cardiac and mediastinal silhouettes. Atherosclerotic plaque. BONES AND SOFT TISSUES: No acute osseous abnormality. IMPRESSION: 1. Elevated right hemidiaphragm with right base scarring/atelectasis and trace right pleural effusion. 2. Peripheral right mid lung airspace opacity. Electronically signed by: Taylor Stroud MD 12/03/2023 11:26 AM EDT RP Workstation: [id number]        Scheduled Meds:  azithromycin   500 mg Oral Daily   busPIRone   5 mg Oral TID   cholecalciferol   5,000 Units Oral Daily   enoxaparin  (LOVENOX ) injection  30 mg Subcutaneous Q24H   guaiFENesin   600 mg Oral BID   latanoprost   1 drop Both Eyes QHS   levothyroxine   125 mcg Oral Daily   lidocaine   1 patch Transdermal q AM   lipase/protease/amylase  24,000 Units Oral TID AC   QUEtiapine   12.5 mg Oral QHS   sertraline   50 mg Oral Daily   timolol   1 drop Both Eyes BID   Continuous Infusions:  cefTRIAXone  (ROCEPHIN )  IV       LOS: 1 day    Time spent: 51 minutes    Renato Applebaum, MD [facility]

## 2023-12-04 NOTE — Plan of Care (Signed)

## 2023-12-04 NOTE — Care Management CC44 (Signed)
 Condition Code 44 Documentation Completed  Patient Details  Name: Kristin Bryant MRN: [id number] Date of Birth: 05/25/1922   Condition Code 44 given:  Yes Patient signature on Condition Code 44 notice:  Yes Documentation of 2 MD's agreement:  Yes Code 44 added to claim:  Yes    Bascom Service, RN 12/04/2023, 4:05 PM

## 2023-12-04 NOTE — TOC Initial Note (Signed)
 Transition of Care [facility]) - Initial/Assessment Note    Patient Details  Name: Kristin Bryant MRN: [id number] Date of Birth: 02-27-1923  [facility] CM/SW Contact:    Bascom Service, RN Phone Number: 12/04/2023, 2:30 PM  Clinical Narrative: Left vm w/Adm Coordinator Kristin Bryant confirm from ALF-left vm w/Kristin Bryant(son)-await call back. d/c plan return back to [facility] [place] [facility]. PT cons-await recc.DNR, wears eyeglasses.                  Expected Discharge Plan: Assisted Living Barriers to Discharge: Continued Medical Work up   Patient Goals and CMS Choice Patient states their goals for this hospitalization and ongoing recovery are:: From [facility] [place] CMS Medicare.gov Compare Post Acute Care list provided to:: Patient Represenative (must comment) Choice offered to / list presented to : Spouse Kristin Bryant ownership interest in [facility].provided to:: Spouse    Expected Discharge Plan and Services   Discharge Planning Services: CM Consult Post Acute Care Choice: Resumption of Svcs/PTA Provider (ALF) Living arrangements for the past 2 months: Assisted Living Facility                                      Prior Living Arrangements/Services Living arrangements for the past 2 months: Assisted Living Facility Lives with:: Facility Resident              Current home services: DME (w/c)    Activities of Daily Living   ADL Screening (condition at time of admission) Independently performs ADLs?: No Does the patient have a NEW difficulty with bathing/dressing/toileting/self-feeding that is expected to last >3 days?: No Does the patient have a NEW difficulty with getting in/out of bed, walking, or climbing stairs that is expected to last >3 days?: No Does the patient have a NEW difficulty with communication that is expected to last >3 days?: No Is the patient deaf or have difficulty hearing?: Yes Does the patient have difficulty seeing, even  when wearing glasses/contacts?: Yes Does the patient have difficulty concentrating, remembering, or making decisions?: Yes  Permission Sought/Granted                  Emotional Assessment              Admission diagnosis:  Acute UTI (urinary tract infection) [N39.0] Pneumonia of right lung due to infectious organism, unspecified part of lung [J18.9] UTI (urinary tract infection) [N39.0] Patient Active Problem List   Diagnosis Date Noted   UTI (urinary tract infection) 12/04/2023   Allergic rhinitis 12/03/2023   Major depressive disorder, recurrent, moderate (HCC) 12/03/2023   Osteopenia 12/03/2023   Acute UTI (urinary tract infection) 12/03/2023   Hyperglycemia 12/03/2023   Mild protein malnutrition (HCC) 12/03/2023   Abnormal LFTs 12/03/2023   Elevated serum creatinine 12/03/2023   Pulmonary infiltrates 12/03/2023   Abdominal pain 01/18/2022   Chronic pancreatitis (HCC) 01/18/2022   Vomiting 01/18/2022   Fall at home, initial encounter 01/18/2022   Thoracic compression fracture (HCC) 01/18/2022   Constipation 03/11/2021   HTN (hypertension) 03/11/2021   HLD (hyperlipidemia) 03/11/2021   Hypothyroidism 03/11/2021   Cognitive impairment 03/11/2021   Pubic ramus fracture (HCC) 03/10/2021   Malignant neoplasm of lower-outer quadrant of right breast of female, estrogen receptor positive (HCC) 01/22/2019   Syncope 10/15/2016   Fatigue 10/15/2016   Abnormal EKG 10/15/2016   Chest pain 10/15/2016   PCP:  Kristin Ruth, NP Pharmacy:   [facility] [address] [place] - [address] [place] [address] Phone: [phone] Fax: [phone]     Social Drivers of Health (SDOH) Social History: SDOH Screenings   Food Insecurity: No Food Insecurity (12/03/2023)  Housing: Low Risk  (12/03/2023)  Transportation Needs: No Transportation Needs (12/03/2023)  Utilities: Not At Risk (12/03/2023)  Social Connections: Patient Declined (12/03/2023)   Tobacco Use: Medium Risk (12/03/2023)   SDOH Interventions:     Readmission Risk Interventions     No data to display

## 2023-12-04 NOTE — Care Management Obs Status (Signed)
 MEDICARE OBSERVATION STATUS NOTIFICATION   Patient Details  Name: Kristin Bryant MRN: [id number] Date of Birth: May 25, 1922   Medicare Observation Status Notification Given:  Yes    MahabirNathanel, RN 12/04/2023, 4:04 PM

## 2023-12-04 NOTE — Progress Notes (Signed)
 PHARMACY - PHYSICIAN COMMUNICATION CRITICAL VALUE ALERT - BLOOD CULTURE IDENTIFICATION (BCID)  Kristin Bryant is an 89 y.o. female who presented to [facility] on 12/03/2023 with a chief complaint of dysuria, n/v, and abdominal pain.   Assessment: Pt currently on antibiotics for UTI, CAP.  -BCID + 1/4 MRSE  Name of physician (or Provider) Contacted: Dr. Raenelle  Current antibiotics: Ceftriaxone  + azithromycin   Changes to prescribed antibiotics recommended:  No changes, likely contaminant.   Results for orders placed or performed during the hospital encounter of 12/03/23  Blood Culture ID Panel (Reflexed) (Collected: 12/03/2023 12:48 PM)  Result Value Ref Range   Enterococcus faecalis NOT DETECTED NOT DETECTED   Enterococcus Faecium NOT DETECTED NOT DETECTED   Listeria monocytogenes NOT DETECTED NOT DETECTED   Staphylococcus species DETECTED (A) NOT DETECTED   Staphylococcus aureus (BCID) NOT DETECTED NOT DETECTED   Staphylococcus epidermidis DETECTED (A) NOT DETECTED   Staphylococcus lugdunensis NOT DETECTED NOT DETECTED   Streptococcus species NOT DETECTED NOT DETECTED   Streptococcus agalactiae NOT DETECTED NOT DETECTED   Streptococcus pneumoniae NOT DETECTED NOT DETECTED   Streptococcus pyogenes NOT DETECTED NOT DETECTED   A.calcoaceticus-baumannii NOT DETECTED NOT DETECTED   Bacteroides fragilis NOT DETECTED NOT DETECTED   Enterobacterales NOT DETECTED NOT DETECTED   Enterobacter cloacae complex NOT DETECTED NOT DETECTED   Escherichia coli NOT DETECTED NOT DETECTED   Klebsiella aerogenes NOT DETECTED NOT DETECTED   Klebsiella oxytoca NOT DETECTED NOT DETECTED   Klebsiella pneumoniae NOT DETECTED NOT DETECTED   Proteus species NOT DETECTED NOT DETECTED   Salmonella species NOT DETECTED NOT DETECTED   Serratia marcescens NOT DETECTED NOT DETECTED   Haemophilus influenzae NOT DETECTED NOT DETECTED   Neisseria meningitidis NOT DETECTED NOT DETECTED   Pseudomonas aeruginosa NOT  DETECTED NOT DETECTED   Stenotrophomonas maltophilia NOT DETECTED NOT DETECTED   Candida albicans NOT DETECTED NOT DETECTED   Candida auris NOT DETECTED NOT DETECTED   Candida glabrata NOT DETECTED NOT DETECTED   Candida krusei NOT DETECTED NOT DETECTED   Candida parapsilosis NOT DETECTED NOT DETECTED   Candida tropicalis NOT DETECTED NOT DETECTED   Cryptococcus neoformans/gattii NOT DETECTED NOT DETECTED   Methicillin resistance mecA/C DETECTED (A) NOT DETECTED    Ronal CHRISTELLA Rav, PharmD 12/04/2023  5:53 PM

## 2023-12-04 NOTE — Evaluation (Signed)
 Occupational Therapy Evaluation Patient Details Name: Kristin Bryant MRN: [id number] DOB: 04-24-1922 Today's Date: 12/04/2023   History of Present Illness   89 year old presented to the ER with abdominal pain, nausea and emesis with mild dysuria and frequency, from assisted living facility, with history of chronic parotitis, hypertension, hyperlipidemia, hypothyroidism     Clinical Impressions Pt resting in bed, working with PT upon entry, preparing to assist to recliner. Pt lives at assisted living, plans to return, PLOF needs assist for ADLs/mobility, likely close to baseline, max/total for UB/LB dressing/bathing. Pt able to stand a pivot mod A x2 to recilner using RW. Pt not aware of deficits, difficulty following commands. Pt would benefit from continued acute OT to maximize functional strength and participation in ADLs, planning to return to assisted living and has necessary support/DME.      If plan is discharge home, recommend the following:   A lot of help with walking and/or transfers;A lot of help with bathing/dressing/bathroom;Assistance with cooking/housework;Assistance with feeding;Direct supervision/assist for medications management;Direct supervision/assist for financial management;Assist for transportation     Functional Status Assessment   Patient has had a recent decline in their functional status and demonstrates the ability to make significant improvements in function in a reasonable and predictable amount of time.     Equipment Recommendations   None recommended by OT     Recommendations for Other Services         Precautions/Restrictions   Precautions Precautions: Fall Recall of Precautions/Restrictions: Impaired Restrictions Weight Bearing Restrictions Per Provider Order: No     Mobility Bed Mobility Overal bed mobility: Needs Assistance Bed Mobility: Supine to Sit     Supine to sit: Mod assist     General bed mobility comments: mod A to  assist scooting to EOB    Transfers Overall transfer level: Needs assistance Equipment used: Rolling walker (2 wheels) Transfers: Sit to/from Stand, Bed to chair/wheelchair/BSC Sit to Stand: Mod assist, +2 safety/equipment     Step pivot transfers: Mod assist     General transfer comment: mod A x2 for safety to recliner      Balance Overall balance assessment: Needs assistance Sitting-balance support: Single extremity supported, Feet supported Sitting balance-Leahy Scale: Poor Sitting balance - Comments: needs one hand supported   Standing balance support: Bilateral upper extremity supported, During functional activity, Reliant on assistive device for balance Standing balance-Leahy Scale: Poor Standing balance comment: reliant on RW                           ADL either performed or assessed with clinical judgement   ADL Overall ADL's : Needs assistance/impaired Eating/Feeding: Set up;Sitting   Grooming: Minimal assistance;Cueing for sequencing;Sitting   Upper Body Bathing: Maximal assistance;Sitting   Lower Body Bathing: Total assistance;Sitting/lateral leans   Upper Body Dressing : Maximal assistance;Sitting   Lower Body Dressing: Total assistance;Sitting/lateral leans   Toilet Transfer: Moderate assistance;+2 for safety/equipment;Rolling walker (2 wheels);BSC/3in1             General ADL Comments: Pt max-total for dressing/bathing, has difficulty sitting on EOB without one hand supported. Pt difficulty following simple commands, transfers with mod A x2 assist to recliner     Vision         Perception         Praxis         Pertinent Vitals/Pain Pain Assessment Pain Assessment: Faces Faces Pain Scale: Hurts a little bit Pain Location: generalized Pain  Descriptors / Indicators: Aching Pain Intervention(s): Monitored during session     Extremity/Trunk Assessment Upper Extremity Assessment Upper Extremity Assessment: Generalized  weakness   Lower Extremity Assessment Lower Extremity Assessment: Defer to PT evaluation       Communication Communication Communication: No apparent difficulties   Cognition Arousal: Alert Behavior During Therapy: WFL for tasks assessed/performed Cognition: History of cognitive impairments             OT - Cognition Comments: Pt with evident deficits in memory, not aware of deficits, difficulty following instructions consistently                 Following commands: Impaired Following commands impaired: Follows one step commands inconsistently, Follows one step commands with increased time     Cueing  General Comments   Cueing Techniques: Verbal cues;Gestural cues      Exercises     Shoulder Instructions      Home Living Family/patient expects to be discharged to:: Assisted living                                 Additional Comments: from assisted living, plans to return      Prior Functioning/Environment Prior Level of Function : Needs assist             Mobility Comments: uses w/c, stand pivots ADLs Comments: likely needs help with ADLs    OT Problem List: Decreased strength;Decreased range of motion;Decreased activity tolerance;Pain;Decreased safety awareness;Decreased cognition   OT Treatment/Interventions: Self-care/ADL training;Therapeutic exercise;Energy conservation;DME and/or AE instruction;Therapeutic activities;Patient/family education;Balance training      OT Goals(Current goals can be found in the care plan section)   Acute Rehab OT Goals Patient Stated Goal: not able to participate in setting goals OT Goal Formulation: Patient unable to participate in goal setting Time For Goal Achievement: 12/18/23 Potential to Achieve Goals: Good   OT Frequency:  Min 2X/week    Co-evaluation              AM-PAC OT 6 Clicks Daily Activity     Outcome Measure Help from another person eating meals?: A Little Help from  another person taking care of personal grooming?: A Little Help from another person toileting, which includes using toliet, bedpan, or urinal?: Total Help from another person bathing (including washing, rinsing, drying)?: A Lot Help from another person to put on and taking off regular upper body clothing?: A Lot Help from another person to put on and taking off regular lower body clothing?: Total 6 Click Score: 12   End of Session Equipment Utilized During Treatment: Gait belt;Rolling walker (2 wheels) Nurse Communication: Mobility status  Activity Tolerance: Patient tolerated treatment well Patient left: in chair;with call bell/phone within reach;with chair alarm set  OT Visit Diagnosis: Unsteadiness on feet (R26.81);Other abnormalities of gait and mobility (R26.89);Muscle weakness (generalized) (M62.81);Pain                Time: 1600-1616 OT Time Calculation (min): 16 min Charges:  OT General Charges $OT Visit: 1 Visit OT Evaluation $OT Eval Moderate Complexity: 1 [address], OTR/L   Elouise JONELLE Bott 12/04/2023, 4:37 PM

## 2023-12-04 NOTE — Evaluation (Signed)
 Clinical/Bedside Swallow Evaluation Patient Details  Name: Kristin Bryant MRN: [id number] Date of Birth: March 05, 1923  Today's Date: 12/04/2023 Time: SLP Start Time (ACUTE ONLY): 1550 SLP Stop Time (ACUTE ONLY): 1615 SLP Time Calculation (min) (ACUTE ONLY): 25 min  Past Medical History:  Past Medical History:  Diagnosis Date   Breast cancer (HCC) 12/2018   right   Chronic pancreatitis (HCC) 01/18/2022   Elevated cholesterol    Hypertension    Hypothyroidism    Osteoporosis    Thoracic compression fracture (HCC) 01/18/2022   Past Surgical History:  Past Surgical History:  Procedure Laterality Date   APPENDECTOMY     BACK SURGERY     CATARACT EXTRACTION     VAGINAL HYSTERECTOMY     HPI:  89 yo female adm to [facility] with N/V, respiratory issues - diagnosed with UTI and right middle lobe opacity. -  PMH + for breast cancer - thoracic compression fx diagnosed 01/2022,  colonic diverticulosis,  pulmonary nodule. depression, pancreatitis, She has had 4 admits in four months - for various reasons including fall, pna/fall, diarrhea.  She stays at an ALF and is on regular/thin diet with chopped meats.  Swallow eval ordered received.  Pt denies issues with swallowing - except on one occasion.  Note pt with dentures in place and baseline cognitive deficits. Pt today reports she doesn't want to vomit.    Assessment / Plan / Recommendation  Clinical Impression  Patient presents with cognitive based dysphagia but no s/s of aspiration across all po trials. She was having significant back pain and benefited from pain medicine provided with ice cream and reverse trendelenberg for bed positioning to optimize airway protection and comfort.  She demonstrates mild delay in swallow response - - No clinical indication of aspiration with po. She takes only very small bites/sips and demonstrates prolonged mastication but adequate clearance. Recommend she continue regular/thin diet - consider chopped meats as  consistent with her diet PTA.  SLP provided her with Ensure as she states her son reports she doesn't eat well.  No SLP follow up indicated at this time. SLP Visit Diagnosis: Dysphagia, oral phase (R13.11)    Aspiration Risk  Mild aspiration risk    Diet Recommendation Regular;Thin liquid    Liquid Administration via: Cup;Straw Medication Administration: Crushed with puree Supervision: Patient able to self feed Compensations: Slow rate;Small sips/bites Postural Changes: Seated upright at 90 degrees;Remain upright for at least 30 minutes after po intake    Other  Recommendations Oral Care Recommendations: Oral care BID     Assistance Recommended at Discharge  N/a  Functional Status Assessment  N/a  Frequency and Duration     N/a       Prognosis   N/a     Swallow Study   General Date of Onset: 12/04/23 HPI: 89 yo female adm to [facility] with N/V, respiratory issues - diagnosed with UTI and right middle lobe opacity. -  PMH + for breast cancer - thoracic compression fx diagnosed 01/2022,  colonic diverticulosis,  pulmonary nodule. depression, pancreatitis, She has had 4 admits in four months - for various reasons including fall, pna/fall, diarrhea.  She stays at an ALF and is on regular/thin diet with chopped meats.  Swallow eval ordered received.  Pt denies issues with swallowing - except on one occasion.  Note pt with dentures in place and baseline cognitive deficits. Pt today reports she doesn't want to vomit. Type of Study: Bedside Swallow Evaluation Previous Swallow Assessment: none in the  system Diet Prior to this Study: Regular;Thin liquids (Level 0) Temperature Spikes Noted: No Respiratory Status: Room air History of Recent Intubation: No Behavior/Cognition: Alert;Cooperative;Pleasant mood Oral Cavity Assessment: Dry Oral Care Completed by SLP: No Oral Cavity - Dentition: Dentures, top;Dentures, bottom Vision: Functional for self-feeding Self-Feeding Abilities: Able to feed  self Patient Positioning: Partially reclined (due to severe back discomfort) Baseline Vocal Quality: Normal Volitional Cough: Strong Volitional Swallow: Unable to elicit    Oral/Motor/Sensory Function Overall Oral Motor/Sensory Function: Within functional limits   Ice Chips Ice chips: Not tested   Thin Liquid Thin Liquid: Impaired Presentation: Straw;Self Fed Pharyngeal  Phase Impairments: Suspected delayed Swallow    Nectar Thick Nectar Thick Liquid: Impaired Presentation: Straw;Self Fed   Honey Thick Honey Thick Liquid: Not tested   Puree Puree: Impaired Presentation: Spoon Pharyngeal Phase Impairments: Suspected delayed Swallow   Solid     Solid: Impaired Presentation: Self Fed Oral Phase Functional Implications: Prolonged oral transit;Other (comment) (prolonged mastication) Pharyngeal Phase Impairments: Suspected delayed Swallow      Nicolas Emmie Caldron 12/04/2023,4:52 PM  Madelin POUR, MS [facility] SLP Acute Rehab Services Office [phone]

## 2023-12-05 DIAGNOSIS — N390 Urinary tract infection, site not specified: Secondary | ICD-10-CM | POA: Diagnosis not present

## 2023-12-05 LAB — BASIC METABOLIC PANEL WITH GFR
Anion gap: 10 (ref 5–15)
BUN: 14 mg/dL (ref 8–23)
CO2: 26 mmol/L (ref 22–32)
Calcium: 9.5 mg/dL (ref 8.9–10.3)
Chloride: 103 mmol/L (ref 98–111)
Creatinine, Ser: 1.04 mg/dL — ABNORMAL HIGH (ref 0.44–1.00)
GFR, Estimated: 48 mL/min — ABNORMAL LOW (ref >60)
Glucose, Bld: 198 mg/dL — ABNORMAL HIGH (ref 70–99)
Potassium: 4.1 mmol/L (ref 3.5–5.1)
Sodium: 139 mmol/L (ref 135–145)

## 2023-12-05 LAB — URINE CULTURE: Special Requests: NORMAL

## 2023-12-05 LAB — HEMOGLOBIN A1C
Hgb A1c MFr Bld: 7.5 % — ABNORMAL HIGH (ref 4.8–5.6)
Mean Plasma Glucose: 169 mg/dL

## 2023-12-05 MED ORDER — CEPHALEXIN 500 MG PO CAPS: 500.0000 mg | ORAL_CAPSULE | Freq: Three times a day (TID) | ORAL | 0 refills | Status: AC

## 2023-12-05 MED ORDER — SODIUM CHLORIDE 0.9 % IV SOLN
1.0000 g | Freq: Once | INTRAVENOUS | Status: AC
  Administered 2023-12-05: 1 g via INTRAVENOUS
  Filled 2023-12-05: qty 10

## 2023-12-05 NOTE — NC FL2 (Signed)
 [place]  MEDICAID FL2 LEVEL OF CARE FORM     IDENTIFICATION  Patient Name: Kristin Bryant Birthdate: [birth year] Sex: female Admission Date (Current Location): 12/03/2023  [facility] and [place] Number:  Producer, television/film/video and Address:  [facility],  [address], [place] [address]      Provider Number: [id number]  Attending Physician Name and Address:  Kristin Coria, MD  Relative Name and Phone Number:       Current Level of Care: Hospital Recommended Level of Care: Assisted Living Facility Prior Approval Number:    Date Approved/Denied:   PASRR Number: [id number]  Discharge Plan: Other (Comment) (ALF)    Current Diagnoses: Patient Active Problem List   Diagnosis Date Noted   UTI (urinary tract infection) 12/04/2023   Allergic rhinitis 12/03/2023   Major depressive disorder, recurrent, moderate (HCC) 12/03/2023   Osteopenia 12/03/2023   Acute UTI (urinary tract infection) 12/03/2023   Hyperglycemia 12/03/2023   Mild protein malnutrition (HCC) 12/03/2023   Abnormal LFTs 12/03/2023   Elevated serum creatinine 12/03/2023   Pulmonary infiltrates 12/03/2023   Abdominal pain 01/18/2022   Chronic pancreatitis (HCC) 01/18/2022   Vomiting 01/18/2022   Fall at home, initial encounter 01/18/2022   Thoracic compression fracture (HCC) 01/18/2022   Constipation 03/11/2021   HTN (hypertension) 03/11/2021   HLD (hyperlipidemia) 03/11/2021   Hypothyroidism 03/11/2021   Cognitive impairment 03/11/2021   Pubic ramus fracture (HCC) 03/10/2021   Malignant neoplasm of lower-outer quadrant of right breast of female, estrogen receptor positive (HCC) 01/22/2019   Syncope 10/15/2016   Fatigue 10/15/2016   Abnormal EKG 10/15/2016   Chest pain 10/15/2016    Orientation RESPIRATION BLADDER Height & Weight     Self  Normal Incontinent Weight: 134 lb 7.7 oz (61 kg) (on 11/18/2023) Height:  5' 2 (157.5 cm)  BEHAVIORAL SYMPTOMS/MOOD NEUROLOGICAL BOWEL NUTRITION STATUS       Incontinent Diet (see dc summary)  AMBULATORY STATUS COMMUNICATION OF NEEDS Skin   Limited Assist Verbally Normal                       Personal Care Assistance Level of Assistance  Bathing, Feeding, Dressing Bathing Assistance: Limited assistance Feeding assistance: Limited assistance Dressing Assistance: Limited assistance     Functional Limitations Info  Speech, Sight, Hearing Sight Info: Impaired Hearing Info: Impaired Speech Info: Adequate    SPECIAL CARE FACTORS FREQUENCY                       Contractures Contractures Info: Not present    Additional Factors Info  Code Status, Allergies Code Status Info: DNR Allergies Info: Aspirin  Benzonatate  Celexa (Citalopram)  Chlorzoxazone  Cortisone  Diclofenac-misoprostol  Escitalopram Oxalate  Guaifenesin   Guaifenesin  & Derivatives  Hydrocodone   Ipratropium Bromide  [place] Herb (Convallariae Majalis)  Mirtazapine  Naproxen  Neosporin (Bacitracin-polymyxin B)  Prednisone  Pseudoephedrine  Ranitidine  Ranitidine Hcl  Sulfa Antibiotics  Tylenol  (Acetaminophen )  Epinephrine  Latex           Current Medications (12/05/2023):   TAKE these medications     acetaminophen  325 MG tablet Commonly known as: TYLENOL  Take 650 mg by mouth every 8 (eight) hours as needed for mild pain (pain score 1-3) or moderate pain (pain score 4-6).    albuterol  108 (90 Base) MCG/ACT inhaler Commonly known as: VENTOLIN  HFA Inhale 2 puffs into the lungs every 4 (four) hours as needed for wheezing  or shortness of breath.    antiseptic oral rinse Liqd 20 mLs by Mouth Rinse route 2 (two) times daily.    bisacodyl  5 MG EC tablet Commonly known as: DULCOLAX Take 5 mg by mouth daily as needed for moderate constipation.    busPIRone  5 MG tablet Commonly known as: BUSPAR  Take 5 mg by mouth 3 (three) times daily.    cephALEXin  500 MG capsule Commonly known as: KEFLEX  Take 1 capsule (500 mg total) by mouth 3 (three) times  daily for 5 days.    guaiFENesin  600 MG 12 hr tablet Commonly known as: MUCINEX  Take 600 mg by mouth 2 (two) times daily.    latanoprost  0.005 % ophthalmic solution Commonly known as: XALATAN  Place 1 drop into both eyes at bedtime.    levothyroxine  125 MCG tablet Commonly known as: SYNTHROID  Take 125 mcg by mouth daily.    lidocaine  4 % Place 1 patch onto the skin in the morning. To right ankle, lower back.    ondansetron  4 MG disintegrating tablet Commonly known as: ZOFRAN -ODT Take 1 tablet (4 mg total) by mouth every 8 (eight) hours as needed.    Pancrelipase  (Lip-Prot-Amyl) 24000-76000 units Cpep Take 1 capsule (24,000 Units total) by mouth 3 (three) times daily before meals.    polyethylene glycol powder 17 GM/SCOOP powder Commonly known as: GLYCOLAX /MIRALAX  Take 17 g by mouth in the morning and at bedtime. What changed:  when to take this reasons to take this    PRESERVISION AREDS 2 PO Take 1 capsule by mouth daily.    QUEtiapine  25 MG tablet Commonly known as: SEROQUEL  Take 12.5 mg by mouth at bedtime.    senna-docusate 8.6-50 MG tablet Commonly known as: Senokot-S Take 1 tablet by mouth daily as needed for mild constipation or moderate constipation.    sertraline  50 MG tablet Commonly known as: ZOLOFT  Take 50 mg by mouth daily.    timolol  0.5 % ophthalmic solution Commonly known as: TIMOPTIC  Place 1 drop into both eyes 2 (two) times daily.    Vitamin D -3 125 MCG (5000 UT) Tabs Take 5,000 Units by mouth daily.        Discharge Medications: Please see discharge summary for a list of discharge medications.  Relevant Imaging Results:  Relevant Lab Results:   Additional Information SSN [id number]  Kristin Bryant, [place]

## 2023-12-05 NOTE — Discharge Summary (Addendum)
 Physician Discharge Summary  Kristin Bryant FMW:[id number] DOB: [birth year] DOA: 12/03/2023  PCP: Melony Ruth, NP  Admit date: 12/03/2023 Discharge date: 12/05/2023  Admitted From: ALF Disposition: ALF with PT OT  Recommendations for Outpatient Follow-up:  Follow up with PCP in 1-2 weeks   Home Health: PT/OT Equipment/Devices: None  Discharge Condition: Fair CODE STATUS: DNR with limited intervention Diet recommendation: Regular diet  Discharge summary: 89 year old with history of chronic pancreatitis, hypertension, hyperlipidemia, hypothyroidism from assisted living facility presented to the ER with abdominal pain, nausea and emesis with mild dysuria and frequency.  In the emergency room afebrile.  On room air.  Positive urinalysis.  CT scan abdomen pelvis with contrast new moderate left hydronephrosis and perinephric stranding without visible obstruction.  Admitted with IV antibiotics.  Clinically improved.  Blood cultures with Staph epidermidis 1 out of 4 bottles.  Urine cultures with multiple species.   Assessment & Plan:   Acute UTI present on admission Left-sided hydronephrosis without visible obstruction, possibly passed a stone? Patient received Rocephin , day 3 today.  Blood cultures with coag negative Staphylococcus.  Clinically improved.  Urine culture with multiple species.  Will treat with Keflex  for 5 additional days. With no visible obstructive stone, normal renal functions and medical stabilization, patient will not need any intervention for her hydronephrosis.    Hypothyroidism: On Synthroid .  Continue.   Chronic pancreatitis: On pancrelipase .  Continue.   Major depressive disorder: Patient on Seroquel , sertraline  and BuSpar .  Continue.  Medically stabilized to transition back to ALF.  She will benefit with PT OT.  Diet Recommendation Regular;Thin liquid     Liquid Administration via: Cup;Straw Medication Administration: Crushed with puree Supervision: Patient able  to self feed Compensations: Slow rate;Small sips/bites Postural Changes: Seated upright at 90 degrees;Remain upright for at least 30 minutes after po intake       Discharge Diagnoses:  Principal Problem:   Acute UTI (urinary tract infection) Active Problems:   HTN (hypertension)   HLD (hyperlipidemia)   Hypothyroidism   Cognitive impairment   Chronic pancreatitis (HCC)   Major depressive disorder, recurrent, moderate (HCC)   Hyperglycemia   Mild protein malnutrition (HCC)   Abnormal LFTs   Elevated serum creatinine   Pulmonary infiltrates   UTI (urinary tract infection)    Discharge Instructions  Discharge Instructions     Diet general   Complete by: As directed    Increase activity slowly   Complete by: As directed       Allergies as of 12/05/2023       Reactions   Aspirin Other (See Comments)   Reaction not listed on the MAR   Benzonatate Nausea Only   Celexa [citalopram]    Allergy not listed on MAR    Chlorzoxazone Other (See Comments)   Reaction not listed on the [facility]   Cortisone Other (See Comments)    Infection. Allergy not listed on MAR    Diclofenac-misoprostol    Allergy not listed on MAR    Escitalopram Oxalate    Allergy not listed on MAR    Guaifenesin  Other (See Comments)   Reaction not listed on the [facility]   Guaifenesin  & Derivatives Nausea Only   Hydrocodone  Other (See Comments)   Reaction not listed on the MAR   Ipratropium Bromide Nausea Only   Allergy not listed on MAR    [facility]] Other (See Comments)   Reaction not listed on the [facility]   Mirtazapine Other (See  Comments)   Passing out. Allergy not listed on MAR    Naproxen    Allergy not listed on MAR    Neosporin [bacitracin-polymyxin B]    Allergy not listed on MAR    Prednisone    Allergy not listed on MAR    Pseudoephedrine Other (See Comments)   Reaction not listed on the MAR   Ranitidine Nausea Only, Other (See Comments)   Stomach upset.  Allergy not listed on MAR    Ranitidine Hcl Other (See Comments)    stomach upset, nausea. Allergy not listed on MAR    Sulfa Antibiotics Other (See Comments)   Reaction not listed on the [facility]   Tylenol  [acetaminophen ]    Epinephrine Palpitations   Rapid heart rate   Latex Rash        Medication List     TAKE these medications    acetaminophen  325 MG tablet Commonly known as: TYLENOL  Take 650 mg by mouth every 8 (eight) hours as needed for mild pain (pain score 1-3) or moderate pain (pain score 4-6).   albuterol  108 (90 Base) MCG/ACT inhaler Commonly known as: VENTOLIN  HFA Inhale 2 puffs into the lungs every 4 (four) hours as needed for wheezing or shortness of breath.   antiseptic oral rinse Liqd 20 mLs by Mouth Rinse route 2 (two) times daily.   bisacodyl  5 MG EC tablet Commonly known as: DULCOLAX Take 5 mg by mouth daily as needed for moderate constipation.   busPIRone  5 MG tablet Commonly known as: BUSPAR  Take 5 mg by mouth 3 (three) times daily.   cephALEXin  500 MG capsule Commonly known as: KEFLEX  Take 1 capsule (500 mg total) by mouth 3 (three) times daily for 5 days.   guaiFENesin  600 MG 12 hr tablet Commonly known as: MUCINEX  Take 600 mg by mouth 2 (two) times daily.   latanoprost  0.005 % ophthalmic solution Commonly known as: XALATAN  Place 1 drop into both eyes at bedtime.   levothyroxine  125 MCG tablet Commonly known as: SYNTHROID  Take 125 mcg by mouth daily.   lidocaine  4 % Place 1 patch onto the skin in the morning. To right ankle, lower back.   ondansetron  4 MG disintegrating tablet Commonly known as: ZOFRAN -ODT Take 1 tablet (4 mg total) by mouth every 8 (eight) hours as needed.   Pancrelipase  (Lip-Prot-Amyl) 24000-76000 units Cpep Take 1 capsule (24,000 Units total) by mouth 3 (three) times daily before meals.   polyethylene glycol powder 17 GM/SCOOP powder Commonly known as: GLYCOLAX /MIRALAX  Take 17 g by mouth in the morning and at  bedtime. What changed:  when to take this reasons to take this   PRESERVISION AREDS 2 PO Take 1 capsule by mouth daily.   QUEtiapine  25 MG tablet Commonly known as: SEROQUEL  Take 12.5 mg by mouth at bedtime.   senna-docusate 8.6-50 MG tablet Commonly known as: Senokot-S Take 1 tablet by mouth daily as needed for mild constipation or moderate constipation.   sertraline  50 MG tablet Commonly known as: ZOLOFT  Take 50 mg by mouth daily.   timolol  0.5 % ophthalmic solution Commonly known as: TIMOPTIC  Place 1 drop into both eyes 2 (two) times daily.   Vitamin D -3 125 MCG (5000 UT) Tabs Take 5,000 Units by mouth daily.        Allergies  Allergen Reactions   Aspirin Other (See Comments)    Reaction not listed on the MAR   Benzonatate Nausea Only   Celexa [Citalopram]     Allergy not listed  on [facility]    Chlorzoxazone Other (See Comments)    Reaction not listed on the [facility]   Cortisone Other (See Comments)     Infection. Allergy not listed on MAR    Diclofenac-Misoprostol     Allergy not listed on MAR    Escitalopram Oxalate     Allergy not listed on MAR    Guaifenesin  Other (See Comments)    Reaction not listed on the [facility]   Guaifenesin  & Derivatives Nausea Only   Hydrocodone  Other (See Comments)    Reaction not listed on the MAR   Ipratropium Bromide Nausea Only    Allergy not listed on MAR    [facility]] Other (See Comments)    Reaction not listed on the [facility]   Mirtazapine Other (See Comments)    Passing out. Allergy not listed on MAR     Naproxen     Allergy not listed on MAR    Neosporin [Bacitracin-Polymyxin B]     Allergy not listed on MAR    Prednisone     Allergy not listed on MAR    Pseudoephedrine Other (See Comments)    Reaction not listed on the MAR   Ranitidine Nausea Only and Other (See Comments)    Stomach upset. Allergy not listed on MAR    Ranitidine Hcl Other (See Comments)     stomach upset, nausea. Allergy not  listed on MAR    Sulfa Antibiotics Other (See Comments)    Reaction not listed on the [facility]   Tylenol  [Acetaminophen ]    Epinephrine Palpitations    Rapid heart rate   Latex Rash    Consultations: None   Procedures/Studies: CT ABDOMEN PELVIS W CONTRAST Result Date: 12/03/2023 CLINICAL DATA:  Abdominal pain EXAM: CT ABDOMEN AND PELVIS WITH CONTRAST TECHNIQUE: Multidetector CT imaging of the abdomen and pelvis was performed using the standard protocol following bolus administration of intravenous contrast. RADIATION DOSE REDUCTION: This exam was performed according to the departmental dose-optimization program which includes automated exposure control, adjustment of the mA and/or kV according to patient size and/or use of iterative reconstruction technique. CONTRAST:  75mL OMNIPAQUE  IOHEXOL  300 MG/ML  SOLN COMPARISON:  11/18/2023 FINDINGS: Lower chest: Trace right pleural effusion. Bibasilar airspace opacities are noted, right greater than left, favor atelectasis. Coronary artery disease and aortic atherosclerosis. Left basilar nodule again noted measuring 6 mm on image 21 compared to 7 mm previously, not significantly changed. Hepatobiliary: No focal hepatic abnormality. Gallbladder unremarkable. Pancreas: Diffuse atrophy. Pancreatic duct is dilated in the body and tail of to 5 mm. Dilatation is upstream from a pancreatic body stone in the pancreatic neck. Calcifications also noted in the pancreatic head. Findings are unchanged since prior study. Spleen: No focal abnormality.  Normal size. Adrenals/Urinary Tract: Left hydronephrosis and perinephric stranding. Left ureter is dilated into the pelvis where there is tapering to normal size in the distal ureter without visible obstructing process or stone. Calcification posterior to the bladder appears separate from the ureter and likely calcified phlebolith. No stones or hydronephrosis on the right. 5.4 cm cystic structure medial to the upper pole of the  left kidney is stable and likely reflects an exophytic cyst. Adrenal glands and urinary bladder unremarkable. Stomach/Bowel: Scattered colonic diverticulosis. No active diverticulitis. Moderate stool burden. Stomach and small bowel decompressed. No bowel obstruction or inflammatory process. Vascular/Lymphatic: Aortic atherosclerosis. No evidence of aneurysm or adenopathy. Reproductive: Prior hysterectomy.  No adnexal masses. Other: No free fluid or  free air. Musculoskeletal: No acute bony abnormality. Moderate chronic T12 compression fracture, stable. Diffuse degenerative disc and facet disease. IMPRESSION: New moderate left hydronephrosis and perinephric stranding. The distal left ureter tapers without visible obstructing stone. Exact cause of the hydronephrosis unknown. Trace right pleural effusion with bibasilar opacities, right greater than left, favor atelectasis. Aortic atherosclerosis. Colonic diverticulosis. Electronically Signed   By: Franky Crease M.D.   On: 12/03/2023 12:22   DG Chest Portable 1 View Result Date: 12/03/2023 EXAM: 1 VIEW XRAY OF THE CHEST 12/03/2023 10:20:10 AM COMPARISON: 09/25/2023 CLINICAL HISTORY: N/v. N/v.; Unable to remove pt bra FINDINGS: LUNGS AND PLEURA: Peripheral right mid lung airspace opacity. Elevated right hemidiaphragm with right base scarring/atelectasis and trace right pleural effusion. Right pleural thickening. HEART AND MEDIASTINUM: No acute abnormality of the cardiac and mediastinal silhouettes. Atherosclerotic plaque. BONES AND SOFT TISSUES: No acute osseous abnormality. IMPRESSION: 1. Elevated right hemidiaphragm with right base scarring/atelectasis and trace right pleural effusion. 2. Peripheral right mid lung airspace opacity. Electronically signed by: Waddell Calk MD 12/03/2023 11:26 AM EDT RP Workstation: [id number]   CT ABDOMEN PELVIS W CONTRAST Result Date: 11/18/2023 CLINICAL DATA:  Left lower quadrant abdominal pain EXAM: CT ABDOMEN AND PELVIS WITH  CONTRAST TECHNIQUE: Multidetector CT imaging of the abdomen and pelvis was performed using the standard protocol following bolus administration of intravenous contrast. RADIATION DOSE REDUCTION: This exam was performed according to the departmental dose-optimization program which includes automated exposure control, adjustment of the mA and/or kV according to patient size and/or use of iterative reconstruction technique. CONTRAST:  OMNIPAQUE  IOHEXOL  300 MG/ML  SOLN COMPARISON:  CT abdomen and pelvis dated 02/18/2023 FINDINGS: Lower chest: 7 x 5 mm left basilar pulmonary nodule (2:17), appears new from 02/18/2023. Basilar right middle lobe subsegmental atelectasis. Trace right pleural effusion. Partially imaged heart size is normal. Coronary artery calcifications. Hepatobiliary: Diffuse parenchymal hypoattenuation can be seen with hepatic steatosis. No intra or extrahepatic biliary ductal dilation. Normal gallbladder. Pancreas: Diffuse pancreatic atrophy with unchanged main pancreatic ductal dilation measuring 5 mm upstream of an intra ductal stone in the pancreatic neck measuring 1.4 x 0.7 cm (6:27). Additional coarse calcifications near the pancreatic head. Spleen: Normal in size without focal abnormality. Adrenals/Urinary Tract: No adrenal nodules. No suspicious renal mass, calculi or hydronephrosis. Unchanged fluid density structure in the left pararenal space abutting the medial left upper pole measures 5.7 x 3.3 cm, which may represent lymphatic malformation or exophytic renal cyst. No focal bladder wall thickening. Stomach/Bowel: Small hiatal hernia. Normal appearance of the stomach. Large proximal duodenal diverticulum near the ampulla. No evidence of bowel wall thickening, distention, or inflammatory changes. Colonic diverticulosis without acute diverticulitis. Appendix is not discretely seen. Vascular/Lymphatic: Aortic atherosclerosis. No enlarged abdominal or pelvic lymph nodes. Reproductive: No  adnexal masses. Other: Mild presacral edema.  No free air. Musculoskeletal: Healing left anterolateral fifth rib fracture. Multilevel degenerative changes of the partially imaged thoracic and lumbar spine. Unchanged compression of T12. IMPRESSION: 1. No acute abdominopelvic abnormality. 2. Colonic diverticulosis without acute diverticulitis. 3. Hepatic steatosis. 4. Healing left anterolateral fifth rib fracture. 5. New 7 x 5 mm left basilar pulmonary nodule. Non-contrast chest CT at 6-12 months is recommended. If the nodule is stable at time of repeat CT, then future CT at 18-24 months (from today's scan) is considered optional for low-risk patients, but is recommended for high-risk patients. This recommendation follows the consensus statement: Guidelines for Management of Incidental Pulmonary Nodules Detected on CT Images: From the Fleischner Society 2017;  Radiology 2017; 715:771-756. 6. Aortic Atherosclerosis (ICD10-I70.0). Coronary artery calcifications. Assessment for potential risk factor modification, dietary therapy or pharmacologic therapy may be warranted, if clinically indicated. Electronically Signed   By: Limin  Xu M.D.   On: 11/18/2023 14:16   (Echo, Carotid, EGD, Colonoscopy, ERCP)    Subjective: Patient seen and examined.  Pleasant.  Forgetful.  Denies any complaints.  Denies any nausea vomiting or abdominal pain.   Discharge Exam: Vitals:   12/04/23 2001 12/05/23 0524  BP: (!) 158/72 (!) 148/72  Pulse: 89 93  Resp: 15 18  Temp: 97.9 F (36.6 C) 98.5 F (36.9 C)  SpO2: 98%    Vitals:   12/04/23 1341 12/04/23 1903 12/04/23 2001 12/05/23 0524  BP: 133/66 (!) 108/49 (!) 158/72 (!) 148/72  Pulse: 81 82 89 93  Resp: 16 16 15 18   Temp: 98.4 F (36.9 C) 98.1 F (36.7 C) 97.9 F (36.6 C) 98.5 F (36.9 C)  TempSrc: Oral   Oral  SpO2: 99% 95% 98%   Weight:      Height:        General: Pt is alert, awake, not in any acute distress Pleasant interaction.  She is forgetful.  She  is oriented to herself. Cardiovascular: RRR, S1/S2 +, no rubs, no gallops Respiratory: CTA bilaterally, no wheezing, no rhonchi Abdominal: Soft, NT, ND, bowel sounds + Extremities: no edema, no cyanosis    The results of significant diagnostics from this hospitalization (including imaging, microbiology, ancillary and laboratory) are listed below for reference.     Microbiology: Recent Results (from the past 240 hours)  Resp panel by RT-PCR (RSV, Flu A&B, Covid) Anterior Nasal Swab     Status: None   Collection Time: 12/03/23 11:42 AM   Specimen: Anterior Nasal Swab  Result Value Ref Range Status   SARS Coronavirus 2 by RT PCR NEGATIVE NEGATIVE Final    Comment: (NOTE) SARS-CoV-2 target nucleic acids are NOT DETECTED.  The SARS-CoV-2 RNA is generally detectable in upper respiratory specimens during the acute phase of infection. The lowest concentration of SARS-CoV-2 viral copies this assay can detect is 138 copies/mL. A negative result does not preclude SARS-Cov-2 infection and should not be used as the sole basis for treatment or other patient management decisions. A negative result may occur with  improper specimen collection/handling, submission of specimen other than nasopharyngeal swab, presence of viral mutation(s) within the areas targeted by this assay, and inadequate number of viral copies(<138 copies/mL). A negative result must be combined with clinical observations, patient history, and epidemiological information. The expected result is Negative.  Fact Sheet for Patients:  BloggerCourse.com  Fact Sheet for Healthcare Providers:  SeriousBroker.it  This test is no t yet approved or cleared by the [place]  FDA and  has been authorized for detection and/or diagnosis of SARS-CoV-2 by FDA under an Emergency Use Authorization (EUA). This EUA will remain  in effect (meaning this test can be used) for the duration of  the COVID-19 declaration under Section 564(b)(1) of the Act, 21 U.S.C.section 360bbb-3(b)(1), unless the authorization is terminated  or revoked sooner.       Influenza A by PCR NEGATIVE NEGATIVE Final   Influenza B by PCR NEGATIVE NEGATIVE Final    Comment: (NOTE) The Xpert Xpress SARS-CoV-2/FLU/RSV plus assay is intended as an aid in the diagnosis of influenza from Nasopharyngeal swab specimens and should not be used as a sole basis for treatment. Nasal washings and aspirates are unacceptable for Xpert Xpress SARS-CoV-2/FLU/RSV testing.  Fact Sheet for Patients: BloggerCourse.com  Fact Sheet for Healthcare Providers: SeriousBroker.it  This test is not yet approved or cleared by the [place]  FDA and has been authorized for detection and/or diagnosis of SARS-CoV-2 by FDA under an Emergency Use Authorization (EUA). This EUA will remain in effect (meaning this test can be used) for the duration of the COVID-19 declaration under Section 564(b)(1) of the Act, 21 U.S.C. section 360bbb-3(b)(1), unless the authorization is terminated or revoked.     Resp Syncytial Virus by PCR NEGATIVE NEGATIVE Final    Comment: (NOTE) Fact Sheet for Patients: BloggerCourse.com  Fact Sheet for Healthcare Providers: SeriousBroker.it  This test is not yet approved or cleared by the [place]  FDA and has been authorized for detection and/or diagnosis of SARS-CoV-2 by FDA under an Emergency Use Authorization (EUA). This EUA will remain in effect (meaning this test can be used) for the duration of the COVID-19 declaration under Section 564(b)(1) of the Act, 21 U.S.C. section 360bbb-3(b)(1), unless the authorization is terminated or revoked.  Performed at [facility], [address] [place], [place] [address]   Urine Culture (for pregnant, neutropenic or urologic  patients or patients with an indwelling urinary catheter)     Status: Abnormal   Collection Time: 12/03/23 11:42 AM   Specimen: Urine, Clean Catch  Result Value Ref Range Status   Specimen Description   Final    URINE, CLEAN CATCH Performed at [facility], [address], [place], [place] [address]    Special Requests   Final    Normal Performed at [facility], [address], [place], [place] [address]    Culture MULTIPLE SPECIES PRESENT, SUGGEST RECOLLECTION (A)  Final   Report Status 12/05/2023 FINAL  Final  Culture, blood (routine x 2)     Status: Abnormal (Preliminary result)   Collection Time: 12/03/23 12:48 PM   Specimen: BLOOD LEFT FOREARM  Result Value Ref Range Status   Specimen Description   Final    BLOOD LEFT FOREARM Performed at [facility], [address], [place], [place] [address]    Special Requests   Final    BOTTLES DRAWN AEROBIC AND ANAEROBIC Blood Culture adequate volume Performed at [facility], [address] [place], [place] [address]    Culture  Setup Time   Final    GRAM POSITIVE COCCI IN CLUSTERS ANAEROBIC BOTTLE ONLY CRITICAL RESULT CALLED TO, READ BACK BY AND VERIFIED WITH: PHARMD MARY  SWAYNE ON 12/04/23 @ 1719 BY DRT    Culture (A)  Final    STAPHYLOCOCCUS EPIDERMIDIS THE SIGNIFICANCE OF ISOLATING THIS ORGANISM FROM A SINGLE SET OF BLOOD CULTURES WHEN MULTIPLE SETS ARE DRAWN IS UNCERTAIN. PLEASE NOTIFY THE MICROBIOLOGY DEPARTMENT WITHIN ONE WEEK IF SPECIATION AND SENSITIVITIES ARE REQUIRED. Performed at [facility], [address], [place], [place] [address]    Report Status PENDING  Incomplete  Blood Culture ID Panel (Reflexed)     Status: Abnormal   Collection Time: 12/03/23 12:48 PM  Result Value Ref Range Status   Enterococcus faecalis NOT DETECTED NOT DETECTED Final   Enterococcus Faecium NOT DETECTED NOT DETECTED Final   Listeria monocytogenes NOT DETECTED NOT  DETECTED Final   Staphylococcus species DETECTED (A) NOT DETECTED Final    Comment: CRITICAL RESULT CALLED TO, READ BACK BY AND VERIFIED WITH: PHARMD MARY  SWAYNE ON 12/04/23 @ 1719 BY DRT    Staphylococcus aureus (BCID) NOT DETECTED NOT  DETECTED Final   Staphylococcus epidermidis DETECTED (A) NOT DETECTED Final    Comment: Methicillin (oxacillin) resistant coagulase negative staphylococcus. Possible blood culture contaminant (unless isolated from more than one blood culture draw or clinical case suggests pathogenicity). No antibiotic treatment is indicated for blood  culture contaminants. CRITICAL RESULT CALLED TO, READ BACK BY AND VERIFIED WITH: PHARMD MARY  SWAYNE ON 12/04/23 @ 1719 BY DRT    Staphylococcus lugdunensis NOT DETECTED NOT DETECTED Final   Streptococcus species NOT DETECTED NOT DETECTED Final   Streptococcus agalactiae NOT DETECTED NOT DETECTED Final   Streptococcus pneumoniae NOT DETECTED NOT DETECTED Final   Streptococcus pyogenes NOT DETECTED NOT DETECTED Final   A.calcoaceticus-baumannii NOT DETECTED NOT DETECTED Final   Bacteroides fragilis NOT DETECTED NOT DETECTED Final   Enterobacterales NOT DETECTED NOT DETECTED Final   Enterobacter cloacae complex NOT DETECTED NOT DETECTED Final   Escherichia coli NOT DETECTED NOT DETECTED Final   Klebsiella aerogenes NOT DETECTED NOT DETECTED Final   Klebsiella oxytoca NOT DETECTED NOT DETECTED Final   Klebsiella pneumoniae NOT DETECTED NOT DETECTED Final   Proteus species NOT DETECTED NOT DETECTED Final   Salmonella species NOT DETECTED NOT DETECTED Final   Serratia marcescens NOT DETECTED NOT DETECTED Final   Haemophilus influenzae NOT DETECTED NOT DETECTED Final   Neisseria meningitidis NOT DETECTED NOT DETECTED Final   Pseudomonas aeruginosa NOT DETECTED NOT DETECTED Final   Stenotrophomonas maltophilia NOT DETECTED NOT DETECTED Final   Candida albicans NOT DETECTED NOT DETECTED Final   Candida auris NOT DETECTED NOT  DETECTED Final   Candida glabrata NOT DETECTED NOT DETECTED Final   Candida krusei NOT DETECTED NOT DETECTED Final   Candida parapsilosis NOT DETECTED NOT DETECTED Final   Candida tropicalis NOT DETECTED NOT DETECTED Final   Cryptococcus neoformans/gattii NOT DETECTED NOT DETECTED Final   Methicillin resistance mecA/C DETECTED (A) NOT DETECTED Final    Comment: CRITICAL RESULT CALLED TO, READ BACK BY AND VERIFIED WITH: PHARMD MARY  SWAYNE ON 12/04/23 @ 1719 BY DRT Performed at [facility], [address], [place], [place] [address]   Culture, blood (routine x 2)     Status: None (Preliminary result)   Collection Time: 12/03/23  3:25 PM   Specimen: BLOOD RIGHT ARM  Result Value Ref Range Status   Specimen Description   Final    BLOOD RIGHT ARM Performed at [facility] [facility], [address] [place], [place] [address]    Special Requests   Final    BOTTLES DRAWN AEROBIC AND ANAEROBIC Blood Culture adequate volume Performed at [facility], [address], [place], [place] [address]    Culture   Final    NO GROWTH < 24 HOURS Performed at [facility], [address], [place], [place] [address]    Report Status PENDING  Incomplete     Labs: BNP (last 3 results) Recent Labs    09/25/23 0956  BNP 107.5*   Basic Metabolic Panel: Recent Labs  Lab 12/03/23 1034 12/04/23 0524 12/05/23 0843  NA 140 140 139  K 4.0 3.8 4.1  CL 100 106 103  CO2 28 21* 26  GLUCOSE 212* 153* 198*  BUN 14 14 14   CREATININE 1.14* 1.22* 1.04*  CALCIUM  9.6 9.0 9.5   Liver Function Tests: Recent Labs  Lab 12/03/23 1034 12/04/23 0524  AST 46* 36  ALT 25 16  ALKPHOS 221* 186*  BILITOT 1.0 0.8  PROT 7.4 5.7*  ALBUMIN 3.4*  2.9*   Recent Labs  Lab 12/03/23 1034  LIPASE 22   No results for input(s): AMMONIA in the last 168 hours. CBC: Recent Labs  Lab 12/03/23 1034 12/04/23 0524  WBC 11.7* 9.1  NEUTROABS 9.4*  --   HGB 14.4 12.1  HCT 44.4 39.0  MCV  95.3 101.0*  PLT 253 209   Cardiac Enzymes: No results for input(s): CKTOTAL, CKMB, CKMBINDEX, TROPONINI in the last 168 hours. BNP: Invalid input(s): POCBNP CBG: No results for input(s): GLUCAP in the last 168 hours. D-Dimer No results for input(s): DDIMER in the last 72 hours. Hgb A1c Recent Labs    12/04/23 0524  HGBA1C 7.5*   Lipid Profile No results for input(s): CHOL, HDL, LDLCALC, TRIG, CHOLHDL, LDLDIRECT in the last 72 hours. Thyroid  function studies No results for input(s): TSH, T4TOTAL, T3FREE, THYROIDAB in the last 72 hours.  Invalid input(s): FREET3 Anemia work up No results for input(s): VITAMINB12, FOLATE, FERRITIN, TIBC, IRON, RETICCTPCT in the last 72 hours. Urinalysis    Component Value Date/Time   COLORURINE YELLOW 12/03/2023 1142   APPEARANCEUR CLEAR 12/03/2023 1142   LABSPEC 1.010 12/03/2023 1142   PHURINE 6.0 12/03/2023 1142   GLUCOSEU NEGATIVE 12/03/2023 1142   HGBUR NEGATIVE 12/03/2023 1142   BILIRUBINUR NEGATIVE 12/03/2023 1142   KETONESUR NEGATIVE 12/03/2023 1142   PROTEINUR NEGATIVE 12/03/2023 1142   NITRITE POSITIVE (A) 12/03/2023 1142   LEUKOCYTESUR MODERATE (A) 12/03/2023 1142   Sepsis Labs Recent Labs  Lab 12/03/23 1034 12/04/23 0524  WBC 11.7* 9.1   Microbiology Recent Results (from the past 240 hours)  Resp panel by RT-PCR (RSV, Flu A&B, Covid) Anterior Nasal Swab     Status: None   Collection Time: 12/03/23 11:42 AM   Specimen: Anterior Nasal Swab  Result Value Ref Range Status   SARS Coronavirus 2 by RT PCR NEGATIVE NEGATIVE Final    Comment: (NOTE) SARS-CoV-2 target nucleic acids are NOT DETECTED.  The SARS-CoV-2 RNA is generally detectable in upper respiratory specimens during the acute phase of infection. The lowest concentration of SARS-CoV-2 viral copies this assay can detect is 138 copies/mL. A negative result does not preclude SARS-Cov-2 infection and should not be  used as the sole basis for treatment or other patient management decisions. A negative result may occur with  improper specimen collection/handling, submission of specimen other than nasopharyngeal swab, presence of viral mutation(s) within the areas targeted by this assay, and inadequate number of viral copies(<138 copies/mL). A negative result must be combined with clinical observations, patient history, and epidemiological information. The expected result is Negative.  Fact Sheet for Patients:  BloggerCourse.com  Fact Sheet for Healthcare Providers:  SeriousBroker.it  This test is no t yet approved or cleared by the [place]  FDA and  has been authorized for detection and/or diagnosis of SARS-CoV-2 by FDA under an Emergency Use Authorization (EUA). This EUA will remain  in effect (meaning this test can be used) for the duration of the COVID-19 declaration under Section 564(b)(1) of the Act, 21 U.S.C.section 360bbb-3(b)(1), unless the authorization is terminated  or revoked sooner.       Influenza A by PCR NEGATIVE NEGATIVE Final   Influenza B by PCR NEGATIVE NEGATIVE Final    Comment: (NOTE) The Xpert Xpress SARS-CoV-2/FLU/RSV plus assay is intended as an aid in the diagnosis of influenza from Nasopharyngeal swab specimens and should not be used as a sole basis for treatment. Nasal washings and aspirates are unacceptable for Xpert Xpress SARS-CoV-2/FLU/RSV testing.  Fact Sheet for Patients: BloggerCourse.com  Fact Sheet for Healthcare Providers: SeriousBroker.it  This test is not yet approved or cleared by the [place]  FDA and has been authorized for detection and/or diagnosis of SARS-CoV-2 by FDA under an Emergency Use Authorization (EUA). This EUA will remain in effect (meaning this test can be used) for the duration of the COVID-19 declaration under Section  564(b)(1) of the Act, 21 U.S.C. section 360bbb-3(b)(1), unless the authorization is terminated or revoked.     Resp Syncytial Virus by PCR NEGATIVE NEGATIVE Final    Comment: (NOTE) Fact Sheet for Patients: BloggerCourse.com  Fact Sheet for Healthcare Providers: SeriousBroker.it  This test is not yet approved or cleared by the [place]  FDA and has been authorized for detection and/or diagnosis of SARS-CoV-2 by FDA under an Emergency Use Authorization (EUA). This EUA will remain in effect (meaning this test can be used) for the duration of the COVID-19 declaration under Section 564(b)(1) of the Act, 21 U.S.C. section 360bbb-3(b)(1), unless the authorization is terminated or revoked.  Performed at [facility], [address], [place], [place] [address]   Urine Culture (for pregnant, neutropenic or urologic patients or patients with an indwelling urinary catheter)     Status: Abnormal   Collection Time: 12/03/23 11:42 AM   Specimen: Urine, Clean Catch  Result Value Ref Range Status   Specimen Description   Final    URINE, CLEAN CATCH Performed at [facility], [address], [place], [place] [address]    Special Requests   Final    Normal Performed at [facility], [address] [place], [place] [address]    Culture MULTIPLE SPECIES PRESENT, SUGGEST RECOLLECTION (A)  Final   Report Status 12/05/2023 FINAL  Final  Culture, blood (routine x 2)     Status: Abnormal (Preliminary result)   Collection Time: 12/03/23 12:48 PM   Specimen: BLOOD LEFT FOREARM  Result Value Ref Range Status   Specimen Description   Final    BLOOD LEFT FOREARM Performed at [facility], [address], [place], [place] [address]    Special Requests   Final    BOTTLES DRAWN AEROBIC AND ANAEROBIC Blood Culture adequate volume Performed at [facility], [address] [place], [place] [address]    Culture  Setup Time   Final    GRAM POSITIVE COCCI IN CLUSTERS ANAEROBIC BOTTLE ONLY CRITICAL RESULT CALLED TO, READ BACK BY AND VERIFIED WITH: PHARMD MARY  SWAYNE ON 12/04/23 @ 1719 BY DRT    Culture (A)  Final    STAPHYLOCOCCUS EPIDERMIDIS THE SIGNIFICANCE OF ISOLATING THIS ORGANISM FROM A SINGLE SET OF BLOOD CULTURES WHEN MULTIPLE SETS ARE DRAWN IS UNCERTAIN. PLEASE NOTIFY THE MICROBIOLOGY DEPARTMENT WITHIN ONE WEEK IF SPECIATION AND SENSITIVITIES ARE REQUIRED. Performed at [facility], [address], [place], [place] [address]    Report Status PENDING  Incomplete  Blood Culture ID Panel (Reflexed)     Status: Abnormal   Collection Time: 12/03/23 12:48 PM  Result Value Ref Range Status   Enterococcus faecalis NOT DETECTED NOT DETECTED Final   Enterococcus Faecium NOT DETECTED NOT DETECTED Final   Listeria monocytogenes NOT DETECTED NOT DETECTED Final   Staphylococcus species DETECTED (A) NOT DETECTED Final    Comment: CRITICAL RESULT CALLED TO, READ BACK BY AND VERIFIED WITH: PHARMD MARY  SWAYNE ON 12/04/23 @ 1719 BY DRT    Staphylococcus aureus (BCID) NOT DETECTED NOT  DETECTED Final   Staphylococcus epidermidis DETECTED (A) NOT DETECTED Final    Comment: Methicillin (oxacillin) resistant coagulase negative staphylococcus. Possible blood culture contaminant (unless isolated from more than one blood culture draw or clinical case suggests pathogenicity). No antibiotic treatment is indicated for blood  culture contaminants. CRITICAL RESULT CALLED TO, READ BACK BY AND VERIFIED WITH: PHARMD MARY  SWAYNE ON 12/04/23 @ 1719 BY DRT    Staphylococcus lugdunensis NOT DETECTED NOT DETECTED Final   Streptococcus species NOT DETECTED NOT DETECTED Final   Streptococcus agalactiae NOT DETECTED NOT DETECTED Final   Streptococcus pneumoniae NOT DETECTED NOT DETECTED Final   Streptococcus pyogenes NOT DETECTED NOT DETECTED Final    A.calcoaceticus-baumannii NOT DETECTED NOT DETECTED Final   Bacteroides fragilis NOT DETECTED NOT DETECTED Final   Enterobacterales NOT DETECTED NOT DETECTED Final   Enterobacter cloacae complex NOT DETECTED NOT DETECTED Final   Escherichia coli NOT DETECTED NOT DETECTED Final   Klebsiella aerogenes NOT DETECTED NOT DETECTED Final   Klebsiella oxytoca NOT DETECTED NOT DETECTED Final   Klebsiella pneumoniae NOT DETECTED NOT DETECTED Final   Proteus species NOT DETECTED NOT DETECTED Final   Salmonella species NOT DETECTED NOT DETECTED Final   Serratia marcescens NOT DETECTED NOT DETECTED Final   Haemophilus influenzae NOT DETECTED NOT DETECTED Final   Neisseria meningitidis NOT DETECTED NOT DETECTED Final   Pseudomonas aeruginosa NOT DETECTED NOT DETECTED Final   Stenotrophomonas maltophilia NOT DETECTED NOT DETECTED Final   Candida albicans NOT DETECTED NOT DETECTED Final   Candida auris NOT DETECTED NOT DETECTED Final   Candida glabrata NOT DETECTED NOT DETECTED Final   Candida krusei NOT DETECTED NOT DETECTED Final   Candida parapsilosis NOT DETECTED NOT DETECTED Final   Candida tropicalis NOT DETECTED NOT DETECTED Final   Cryptococcus neoformans/gattii NOT DETECTED NOT DETECTED Final   Methicillin resistance mecA/C DETECTED (A) NOT DETECTED Final    Comment: CRITICAL RESULT CALLED TO, READ BACK BY AND VERIFIED WITH: PHARMD MARY  SWAYNE ON 12/04/23 @ 1719 BY DRT Performed at [facility], [address] [place], [place] [address]   Culture, blood (routine x 2)     Status: None (Preliminary result)   Collection Time: 12/03/23  3:25 PM   Specimen: BLOOD RIGHT ARM  Result Value Ref Range Status   Specimen Description   Final    BLOOD RIGHT ARM Performed at [facility], [address], [place], [place] [address]    Special Requests   Final    BOTTLES DRAWN AEROBIC AND ANAEROBIC Blood Culture adequate volume Performed at [facility], [address] [place], [place] [address]    Culture   Final    NO GROWTH < 24 HOURS Performed at [facility], [address], [place], [place] [address]    Report Status PENDING  Incomplete     Time coordinating discharge: 35 minutes  SIGNED:   Renato Applebaum, MD  [facility] 12/05/2023, 10:48 AM

## 2023-12-05 NOTE — Plan of Care (Signed)

## 2023-12-06 LAB — CULTURE, BLOOD (ROUTINE X 2): Special Requests: ADEQUATE

## 2023-12-08 LAB — CULTURE, BLOOD (ROUTINE X 2)
Culture: NO GROWTH
Special Requests: ADEQUATE

## 2023-12-10 DIAGNOSIS — H353211 Exudative age-related macular degeneration, right eye, with active choroidal neovascularization: Secondary | ICD-10-CM | POA: Diagnosis not present

## 2023-12-11 DIAGNOSIS — K8689 Other specified diseases of pancreas: Secondary | ICD-10-CM | POA: Diagnosis not present

## 2023-12-11 DIAGNOSIS — N182 Chronic kidney disease, stage 2 (mild): Secondary | ICD-10-CM | POA: Diagnosis not present

## 2023-12-11 DIAGNOSIS — E785 Hyperlipidemia, unspecified: Secondary | ICD-10-CM | POA: Diagnosis not present

## 2023-12-11 DIAGNOSIS — E039 Hypothyroidism, unspecified: Secondary | ICD-10-CM | POA: Diagnosis not present

## 2023-12-11 DIAGNOSIS — Z853 Personal history of malignant neoplasm of breast: Secondary | ICD-10-CM | POA: Diagnosis not present

## 2023-12-11 DIAGNOSIS — I129 Hypertensive chronic kidney disease with stage 1 through stage 4 chronic kidney disease, or unspecified chronic kidney disease: Secondary | ICD-10-CM | POA: Diagnosis not present

## 2023-12-11 DIAGNOSIS — E559 Vitamin D deficiency, unspecified: Secondary | ICD-10-CM | POA: Diagnosis not present

## 2023-12-11 DIAGNOSIS — M170 Bilateral primary osteoarthritis of knee: Secondary | ICD-10-CM | POA: Diagnosis not present

## 2023-12-11 DIAGNOSIS — Z9181 History of falling: Secondary | ICD-10-CM | POA: Diagnosis not present

## 2023-12-11 DIAGNOSIS — Z87891 Personal history of nicotine dependence: Secondary | ICD-10-CM | POA: Diagnosis not present

## 2023-12-11 DIAGNOSIS — H409 Unspecified glaucoma: Secondary | ICD-10-CM | POA: Diagnosis not present

## 2023-12-12 DIAGNOSIS — E039 Hypothyroidism, unspecified: Secondary | ICD-10-CM | POA: Diagnosis not present

## 2023-12-12 DIAGNOSIS — H409 Unspecified glaucoma: Secondary | ICD-10-CM | POA: Diagnosis not present

## 2023-12-12 DIAGNOSIS — M170 Bilateral primary osteoarthritis of knee: Secondary | ICD-10-CM | POA: Diagnosis not present

## 2023-12-12 DIAGNOSIS — N182 Chronic kidney disease, stage 2 (mild): Secondary | ICD-10-CM | POA: Diagnosis not present

## 2023-12-12 DIAGNOSIS — I129 Hypertensive chronic kidney disease with stage 1 through stage 4 chronic kidney disease, or unspecified chronic kidney disease: Secondary | ICD-10-CM | POA: Diagnosis not present

## 2023-12-12 DIAGNOSIS — Z853 Personal history of malignant neoplasm of breast: Secondary | ICD-10-CM | POA: Diagnosis not present

## 2023-12-12 DIAGNOSIS — Z87891 Personal history of nicotine dependence: Secondary | ICD-10-CM | POA: Diagnosis not present

## 2023-12-12 DIAGNOSIS — E785 Hyperlipidemia, unspecified: Secondary | ICD-10-CM | POA: Diagnosis not present

## 2023-12-12 DIAGNOSIS — K8689 Other specified diseases of pancreas: Secondary | ICD-10-CM | POA: Diagnosis not present

## 2023-12-12 DIAGNOSIS — E559 Vitamin D deficiency, unspecified: Secondary | ICD-10-CM | POA: Diagnosis not present

## 2023-12-12 DIAGNOSIS — Z9181 History of falling: Secondary | ICD-10-CM | POA: Diagnosis not present

## 2023-12-14 DIAGNOSIS — E785 Hyperlipidemia, unspecified: Secondary | ICD-10-CM | POA: Diagnosis not present

## 2023-12-14 DIAGNOSIS — Z853 Personal history of malignant neoplasm of breast: Secondary | ICD-10-CM | POA: Diagnosis not present

## 2023-12-14 DIAGNOSIS — I129 Hypertensive chronic kidney disease with stage 1 through stage 4 chronic kidney disease, or unspecified chronic kidney disease: Secondary | ICD-10-CM | POA: Diagnosis not present

## 2023-12-14 DIAGNOSIS — K8689 Other specified diseases of pancreas: Secondary | ICD-10-CM | POA: Diagnosis not present

## 2023-12-14 DIAGNOSIS — Z9181 History of falling: Secondary | ICD-10-CM | POA: Diagnosis not present

## 2023-12-14 DIAGNOSIS — N182 Chronic kidney disease, stage 2 (mild): Secondary | ICD-10-CM | POA: Diagnosis not present

## 2023-12-14 DIAGNOSIS — E039 Hypothyroidism, unspecified: Secondary | ICD-10-CM | POA: Diagnosis not present

## 2023-12-14 DIAGNOSIS — M170 Bilateral primary osteoarthritis of knee: Secondary | ICD-10-CM | POA: Diagnosis not present

## 2023-12-14 DIAGNOSIS — E559 Vitamin D deficiency, unspecified: Secondary | ICD-10-CM | POA: Diagnosis not present

## 2023-12-14 DIAGNOSIS — H409 Unspecified glaucoma: Secondary | ICD-10-CM | POA: Diagnosis not present

## 2023-12-14 DIAGNOSIS — Z87891 Personal history of nicotine dependence: Secondary | ICD-10-CM | POA: Diagnosis not present

## 2023-12-16 DIAGNOSIS — E559 Vitamin D deficiency, unspecified: Secondary | ICD-10-CM | POA: Diagnosis not present

## 2023-12-16 DIAGNOSIS — Z853 Personal history of malignant neoplasm of breast: Secondary | ICD-10-CM | POA: Diagnosis not present

## 2023-12-16 DIAGNOSIS — K8689 Other specified diseases of pancreas: Secondary | ICD-10-CM | POA: Diagnosis not present

## 2023-12-16 DIAGNOSIS — Z87891 Personal history of nicotine dependence: Secondary | ICD-10-CM | POA: Diagnosis not present

## 2023-12-16 DIAGNOSIS — M170 Bilateral primary osteoarthritis of knee: Secondary | ICD-10-CM | POA: Diagnosis not present

## 2023-12-16 DIAGNOSIS — E785 Hyperlipidemia, unspecified: Secondary | ICD-10-CM | POA: Diagnosis not present

## 2023-12-16 DIAGNOSIS — E039 Hypothyroidism, unspecified: Secondary | ICD-10-CM | POA: Diagnosis not present

## 2023-12-16 DIAGNOSIS — H409 Unspecified glaucoma: Secondary | ICD-10-CM | POA: Diagnosis not present

## 2023-12-16 DIAGNOSIS — I129 Hypertensive chronic kidney disease with stage 1 through stage 4 chronic kidney disease, or unspecified chronic kidney disease: Secondary | ICD-10-CM | POA: Diagnosis not present

## 2023-12-16 DIAGNOSIS — N182 Chronic kidney disease, stage 2 (mild): Secondary | ICD-10-CM | POA: Diagnosis not present

## 2023-12-16 DIAGNOSIS — Z9181 History of falling: Secondary | ICD-10-CM | POA: Diagnosis not present

## 2023-12-18 DIAGNOSIS — Z87891 Personal history of nicotine dependence: Secondary | ICD-10-CM | POA: Diagnosis not present

## 2023-12-18 DIAGNOSIS — Z9181 History of falling: Secondary | ICD-10-CM | POA: Diagnosis not present

## 2023-12-18 DIAGNOSIS — I129 Hypertensive chronic kidney disease with stage 1 through stage 4 chronic kidney disease, or unspecified chronic kidney disease: Secondary | ICD-10-CM | POA: Diagnosis not present

## 2023-12-18 DIAGNOSIS — N182 Chronic kidney disease, stage 2 (mild): Secondary | ICD-10-CM | POA: Diagnosis not present

## 2023-12-18 DIAGNOSIS — H409 Unspecified glaucoma: Secondary | ICD-10-CM | POA: Diagnosis not present

## 2023-12-18 DIAGNOSIS — K8689 Other specified diseases of pancreas: Secondary | ICD-10-CM | POA: Diagnosis not present

## 2023-12-18 DIAGNOSIS — M170 Bilateral primary osteoarthritis of knee: Secondary | ICD-10-CM | POA: Diagnosis not present

## 2023-12-18 DIAGNOSIS — E039 Hypothyroidism, unspecified: Secondary | ICD-10-CM | POA: Diagnosis not present

## 2023-12-18 DIAGNOSIS — E559 Vitamin D deficiency, unspecified: Secondary | ICD-10-CM | POA: Diagnosis not present

## 2023-12-18 DIAGNOSIS — Z853 Personal history of malignant neoplasm of breast: Secondary | ICD-10-CM | POA: Diagnosis not present

## 2023-12-18 DIAGNOSIS — E785 Hyperlipidemia, unspecified: Secondary | ICD-10-CM | POA: Diagnosis not present

## 2023-12-22 ENCOUNTER — Emergency Department (HOSPITAL_COMMUNITY)

## 2023-12-22 ENCOUNTER — Emergency Department (HOSPITAL_COMMUNITY)
Admission: EM | Admit: 2023-12-22 | Discharge: 2023-12-22 | Disposition: A | Discharge status: Home / Self Care | Source: Skilled Nursing Facility | Attending: Emergency Medicine | Admitting: Emergency Medicine

## 2023-12-22 ENCOUNTER — Encounter (HOSPITAL_COMMUNITY): Payer: Self-pay

## 2023-12-22 DIAGNOSIS — Z91040 Latex allergy status: Secondary | ICD-10-CM | POA: Diagnosis not present

## 2023-12-22 DIAGNOSIS — M5126 Other intervertebral disc displacement, lumbar region: Secondary | ICD-10-CM | POA: Diagnosis not present

## 2023-12-22 DIAGNOSIS — G319 Degenerative disease of nervous system, unspecified: Secondary | ICD-10-CM | POA: Diagnosis not present

## 2023-12-22 DIAGNOSIS — S2249XA Multiple fractures of ribs, unspecified side, initial encounter for closed fracture: Secondary | ICD-10-CM | POA: Diagnosis not present

## 2023-12-22 DIAGNOSIS — M160 Bilateral primary osteoarthritis of hip: Secondary | ICD-10-CM | POA: Diagnosis not present

## 2023-12-22 DIAGNOSIS — S79911A Unspecified injury of right hip, initial encounter: Secondary | ICD-10-CM | POA: Diagnosis not present

## 2023-12-22 DIAGNOSIS — M438X6 Other specified deforming dorsopathies, lumbar region: Secondary | ICD-10-CM | POA: Diagnosis not present

## 2023-12-22 DIAGNOSIS — F0390 Unspecified dementia without behavioral disturbance: Secondary | ICD-10-CM | POA: Diagnosis not present

## 2023-12-22 DIAGNOSIS — S41112A Laceration without foreign body of left upper arm, initial encounter: Secondary | ICD-10-CM | POA: Diagnosis not present

## 2023-12-22 DIAGNOSIS — S32048D Other fracture of fourth lumbar vertebra, subsequent encounter for fracture with routine healing: Secondary | ICD-10-CM | POA: Diagnosis not present

## 2023-12-22 DIAGNOSIS — S32009D Unspecified fracture of unspecified lumbar vertebra, subsequent encounter for fracture with routine healing: Secondary | ICD-10-CM | POA: Diagnosis not present

## 2023-12-22 DIAGNOSIS — R9389 Abnormal findings on diagnostic imaging of other specified body structures: Secondary | ICD-10-CM | POA: Diagnosis not present

## 2023-12-22 DIAGNOSIS — Z7401 Bed confinement status: Secondary | ICD-10-CM | POA: Diagnosis not present

## 2023-12-22 DIAGNOSIS — S299XXA Unspecified injury of thorax, initial encounter: Secondary | ICD-10-CM | POA: Diagnosis present

## 2023-12-22 DIAGNOSIS — Z743 Need for continuous supervision: Secondary | ICD-10-CM | POA: Diagnosis not present

## 2023-12-22 DIAGNOSIS — S32040D Wedge compression fracture of fourth lumbar vertebra, subsequent encounter for fracture with routine healing: Secondary | ICD-10-CM | POA: Diagnosis not present

## 2023-12-22 DIAGNOSIS — S2242XA Multiple fractures of ribs, left side, initial encounter for closed fracture: Secondary | ICD-10-CM | POA: Diagnosis not present

## 2023-12-22 DIAGNOSIS — I6782 Cerebral ischemia: Secondary | ICD-10-CM | POA: Diagnosis not present

## 2023-12-22 DIAGNOSIS — S199XXA Unspecified injury of neck, initial encounter: Secondary | ICD-10-CM | POA: Diagnosis not present

## 2023-12-22 DIAGNOSIS — R531 Weakness: Secondary | ICD-10-CM | POA: Diagnosis not present

## 2023-12-22 DIAGNOSIS — W19XXXA Unspecified fall, initial encounter: Secondary | ICD-10-CM | POA: Insufficient documentation

## 2023-12-22 DIAGNOSIS — M8580 Other specified disorders of bone density and structure, unspecified site: Secondary | ICD-10-CM | POA: Diagnosis not present

## 2023-12-22 DIAGNOSIS — J90 Pleural effusion, not elsewhere classified: Secondary | ICD-10-CM | POA: Diagnosis not present

## 2023-12-22 DIAGNOSIS — I10 Essential (primary) hypertension: Secondary | ICD-10-CM | POA: Insufficient documentation

## 2023-12-22 DIAGNOSIS — S0990XA Unspecified injury of head, initial encounter: Secondary | ICD-10-CM | POA: Diagnosis not present

## 2023-12-22 NOTE — ED Triage Notes (Signed)
 Pt BIB ems from [place] [facility] due to an unwitnessed Fall. Staff stated she may have been on the floor for about 30 minutes. But she can't tell exactly how long because she has a Hx of dementia. Not on blood thinners, no LOC, not diabetic. VS stable

## 2023-12-22 NOTE — Discharge Instructions (Signed)
 Please use Tylenol  for pain.  You may use 1000 mg of Tylenol  every 6 hours.  Not to exceed 4 g of Tylenol  within 24 hours.  Ideally she should use the incentive spirometer once an hour while awake, if she can use it a few times a day at minimum that would be beneficial to help prevent developing pneumonia.  Please return if she has significantly worsening pain, or more falls.

## 2023-12-22 NOTE — ED Notes (Signed)
 [facility] called for transportation back to [place] [facility].

## 2023-12-22 NOTE — ED Provider Notes (Signed)
 [place] EMERGENCY DEPARTMENT AT [facility] Provider Note   CSN: [id number] Arrival date & time: 12/22/23  1108     Patient presents with: Kristin Bryant is a 89 y.o. female with past medical history seen for hypertension, hyperlipidemia, osteoporosis, chronic pancreatitis who presents with concern for unwitnessed fall.  Reports may have been down for around 30 minutes per staff.  She is not sure exactly how long she was down.  She does have a history of dementia.  Initially was screaming, at time my evaluation she denies any pain.  Pressing on extremity she does report some low back pain.  Daughter reports that she falls somewhat frequently.  Does not take a blood thinner.    Fall       Prior to Admission medications   Medication Sig Start Date End Date Taking? Authorizing Provider  acetaminophen  (TYLENOL ) 325 MG tablet Take 650 mg by mouth every 8 (eight) hours as needed for mild pain (pain score 1-3) or moderate pain (pain score 4-6).    [provider]  albuterol  (VENTOLIN  HFA) 108 (90 Base) MCG/ACT inhaler Inhale 2 puffs into the lungs every 4 (four) hours as needed for wheezing or shortness of breath.    [provider]  antiseptic oral rinse (BIOTENE) LIQD 20 mLs by Mouth Rinse route 2 (two) times daily.    [provider]  bisacodyl  (DULCOLAX) 5 MG EC tablet Take 5 mg by mouth daily as needed for moderate constipation.    [provider]  busPIRone  (BUSPAR ) 5 MG tablet Take 5 mg by mouth 3 (three) times daily. 01/31/23   [provider]  Cholecalciferol  (VITAMIN D -3) 125 MCG (5000 UT) TABS Take 5,000 Units by mouth daily.    [provider]  guaiFENesin  (MUCINEX ) 600 MG 12 hr tablet Take 600 mg by mouth 2 (two) times daily.    [provider]  latanoprost  (XALATAN ) 0.005 % ophthalmic solution Place 1 drop into both eyes at bedtime.    [provider]  levothyroxine  (SYNTHROID ) 125 MCG  tablet Take 125 mcg by mouth daily. 12/08/21   [provider]  lidocaine  4 % Place 1 patch onto the skin in the morning. To right ankle, lower back.    [provider]  lipase/protease/amylase 24000-76000 units CPEP Take 1 capsule (24,000 Units total) by mouth 3 (three) times daily before meals. 01/20/22   [name], MD  Multiple Vitamins-Minerals (PRESERVISION AREDS 2 PO) Take 1 capsule by mouth daily.    [provider]  ondansetron  (ZOFRAN -ODT) 4 MG disintegrating tablet Take 1 tablet (4 mg total) by mouth every 8 (eight) hours as needed. 02/18/23   Franklyn Sid SAILOR, MD  polyethylene glycol powder (GLYCOLAX /MIRALAX ) 17 GM/SCOOP powder Take 17 g by mouth in the morning and at bedtime. Patient taking differently: Take 17 g by mouth daily as needed for mild constipation or moderate constipation. 01/20/22   [name], MD  QUEtiapine  (SEROQUEL ) 25 MG tablet Take 12.5 mg by mouth at bedtime. 12/08/21   [provider]  senna-docusate (SENOKOT-S) 8.6-50 MG tablet Take 1 tablet by mouth daily as needed for mild constipation or moderate constipation.    [provider]  sertraline  (ZOLOFT ) 50 MG tablet Take 50 mg by mouth daily. 12/08/21   [provider]  timolol  (TIMOPTIC ) 0.5 % ophthalmic solution Place 1 drop into both eyes 2 (two) times daily.    [provider]    Allergies:  Aspirin, Benzonatate, Celexa [citalopram], Chlorzoxazone, Cortisone, Diclofenac-misoprostol, Escitalopram oxalate, Guaifenesin , Guaifenesin  & derivatives, Hydrocodone , Ipratropium bromide, [place] valley herb [convallariae majalis], Mirtazapine, Naproxen, Neosporin [bacitracin-polymyxin b], Prednisone, Pseudoephedrine, Ranitidine, Ranitidine hcl, Sulfa antibiotics, Tylenol  [acetaminophen ], Epinephrine, and Latex    Review of Systems  All other systems reviewed and are negative.   Updated Vital Signs BP (!) 149/81 (BP Location: Right Arm)   Pulse  94   Temp 97.6 F (36.4 C) (Oral)   Resp 18   SpO2 96%   Physical Exam Vitals and nursing note reviewed.  Constitutional:      General: She is not in acute distress.    Appearance: Normal appearance.  HENT:     Head: Normocephalic and atraumatic.  Eyes:     General:        Right eye: No discharge.        Left eye: No discharge.  Cardiovascular:     Rate and Rhythm: Normal rate and regular rhythm.     Heart sounds: No murmur heard.    No friction rub. No gallop.  Pulmonary:     Effort: Pulmonary effort is normal.     Breath sounds: Normal breath sounds.  Abdominal:     General: Bowel sounds are normal.     Palpations: Abdomen is soft.  Musculoskeletal:     Comments: Moves all 4 limbs spontaneously, no leg length discrepancy.  No tenderness of bilateral shoulders, elbows, wrists.  Mild tenderness to palpation of hips, no tenderness of knees.  No tenderness of ankles.  Some mild tenderness to palpation in the lumbar midline spine, no step-off, deformity  Some ttp of left lateral ribcage, no flail segment or obvious deformity noted  Skin:    General: Skin is warm and dry.     Capillary Refill: Capillary refill takes less than 2 seconds.  Neurological:     Mental Status: She is alert and oriented to person, place, and time.  Psychiatric:        Mood and Affect: Mood normal.        Behavior: Behavior normal.     (all labs ordered are listed, but only abnormal results are displayed) Labs Reviewed - No data to display  EKG: None  Radiology: CT Lumbar Spine Wo Contrast Result Date: 12/22/2023 EXAM: CT OF THE LUMBAR SPINE WITHOUT CONTRAST 12/22/2023 12:48:59 PM TECHNIQUE: CT of the lumbar spine was performed without the administration of intravenous contrast. Multiplanar reformatted images are provided for review. Automated exposure control, iterative reconstruction, and/or weight based adjustment of the mA/kV was utilized to reduce the radiation dose to as low as reasonably  achievable. COMPARISON: CT of the abdomen and pelvis 12/03/2023 and 11/18/2023. CLINICAL HISTORY: Patient with history of dementia, not on blood thinners, no loss of consciousness, and not diabetic, presenting with back trauma after an unwitnessed fall. FINDINGS: BONES AND ALIGNMENT: A progressive superior endplate fracture is present at L4 with 20% loss of height without retropulsed bone. The superior endplate fracture at T12 is near completely healed. Levoconvex curvature is centered at L2 with asymmetric right-sided endplate changes at L2-3 and asymmetric left-sided endplate changes at L4-5 and L5-S1. DEGENERATIVE CHANGES: Mild disc bulging and facet hypertrophy is present at L1-2 without significant stenosis. A rightward disc protrusion and asymmetric right-sided facet hypertrophy results in moderate subarticular and foraminal narrowing, right greater than left at L2-3. A broad-based disc protrusion and moderate facet hypertrophy results in moderate subarticular and foraminal stenosis bilaterally at L3-4, right greater than left.  Partial laminectomy is noted at L3-4. A broad-based disc protrusion and asymmetric left-sided facet hypertrophy results in moderate subarticular and foraminal narrowing at L4-5 and L5-S1. SOFT TISSUES: A small right pleural effusion and dependent atelectasis are present. Atherosclerotic changes are present in the aorta and branch vessels. Diverticular changes are present in the distal colon without inflammation to suggest diverticulitis. IMPRESSION: 1. Progressive superior endplate fracture at L4 with 20% loss of height, without retropulsed bone. 2. Near-complete healing of the superior endplate fracture at T12. 3. Levoconvex curvature centered at L2 with asymmetric endplate changes at L2-3 (right), L4-5, and L5-S1 (left). 4. Moderate subarticular and foraminal stenosis at L3-4 (right greater than left) and L4-5 (right greater than left) due to disc protrusions and facet hypertrophy.  Electronically signed by: Lonni Necessary MD 12/22/2023 01:11 PM EDT RP Workstation: [id number]   CT Head Wo Contrast Result Date: 12/22/2023 EXAM: CT HEAD AND CERVICAL SPINE 12/22/2023 12:48:59 PM TECHNIQUE: CT of the head and cervical spine was performed without the administration of intravenous contrast. Multiplanar reformatted images are provided for review. Automated exposure control, iterative reconstruction, and/or weight based adjustment of the mA/kV was utilized to reduce the radiation dose to as low as reasonably achievable. COMPARISON: CT head 09/25/2023 and cervical spine CT 08/08/2023. CLINICAL HISTORY: Head trauma, minor (Age >= 65y). Pt BIB ems from [facility] [place] due to an unwitnessed Fall. Staff stated she may have been on the floor for about 30 minutes. But she can't tell exactly how long because she has a Hx of dementia. Not on blood thinners, no LOC, not diabetic. VS stable. FINDINGS: CT HEAD BRAIN AND VENTRICLES: No acute intracranial hemorrhage. No mass effect or midline shift. No abnormal extra-axial fluid collection. No evidence of acute infarct. No hydrocephalus. Hypoattenuating foci in the cerebral white matter, most likely representing chronic small vessel disease. Prominence of the sulci and ventricles compatible with brain atrophy. ORBITS: No acute abnormality. SINUSES AND MASTOIDS: No acute abnormality. SOFT TISSUES AND SKULL: No acute skull fracture. No acute soft tissue abnormality. CT CERVICAL SPINE BONES AND ALIGNMENT: No acute fracture or traumatic malalignment. DEGENERATIVE CHANGES: Multilevel cervical spondylosis identified. This is most severe at C4-5 and C5-6. SOFT TISSUES: No prevertebral soft tissue swelling. IMPRESSION: 1. No acute intracranial abnormality. 2. No acute fracture or traumatic malalignment of the cervical spine. 3. Cervical spondylosis. 4. Chronic small vessel ischemic change and brain atrophy. Electronically signed by: Waddell Calk MD 12/22/2023  01:05 PM EDT RP Workstation: [id number]   CT Cervical Spine Wo Contrast Result Date: 12/22/2023 EXAM: CT HEAD AND CERVICAL SPINE 12/22/2023 12:48:59 PM TECHNIQUE: CT of the head and cervical spine was performed without the administration of intravenous contrast. Multiplanar reformatted images are provided for review. Automated exposure control, iterative reconstruction, and/or weight based adjustment of the mA/kV was utilized to reduce the radiation dose to as low as reasonably achievable. COMPARISON: CT head 09/25/2023 and cervical spine CT 08/08/2023. CLINICAL HISTORY: Head trauma, minor (Age >= 67y). Pt BIB ems from [facility] [place] due to an unwitnessed Fall. Staff stated she may have been on the floor for about 30 minutes. But she can't tell exactly how long because she has a Hx of dementia. Not on blood thinners, no LOC, not diabetic. VS stable. FINDINGS: CT HEAD BRAIN AND VENTRICLES: No acute intracranial hemorrhage. No mass effect or midline shift. No abnormal extra-axial fluid collection. No evidence of acute infarct. No hydrocephalus. Hypoattenuating foci in the cerebral white matter, most likely representing chronic small vessel  disease. Prominence of the sulci and ventricles compatible with brain atrophy. ORBITS: No acute abnormality. SINUSES AND MASTOIDS: No acute abnormality. SOFT TISSUES AND SKULL: No acute skull fracture. No acute soft tissue abnormality. CT CERVICAL SPINE BONES AND ALIGNMENT: No acute fracture or traumatic malalignment. DEGENERATIVE CHANGES: Multilevel cervical spondylosis identified. This is most severe at C4-5 and C5-6. SOFT TISSUES: No prevertebral soft tissue swelling. IMPRESSION: 1. No acute intracranial abnormality. 2. No acute fracture or traumatic malalignment of the cervical spine. 3. Cervical spondylosis. 4. Chronic small vessel ischemic change and brain atrophy. Electronically signed by: Waddell Calk MD 12/22/2023 01:05 PM EDT RP Workstation: [id number]   DG  Pelvis Portable Result Date: 12/22/2023 CLINICAL DATA:  Fall and pelvic pain. EXAM: PORTABLE PELVIS 1-2 VIEWS COMPARISON:  Pelvic radiograph dated 08/08/2023. FINDINGS: No acute fracture or dislocation. The bones are osteopenic. Moderate bilateral hip arthritic changes. The soft tissues are unremarkable IMPRESSION: 1. No acute fracture or dislocation. 2. Moderate bilateral hip arthritic changes. Electronically Signed   By: Vanetta Chou M.D.   On: 12/22/2023 12:44   DG Chest Portable 1 View Result Date: 12/22/2023 EXAM: 1 VIEW XRAY OF THE CHEST 12/22/2023 12:33:00 PM COMPARISON: 12/03/2023 CLINICAL HISTORY: Fall. Pt daughter states she fell at assisted living this morning. FINDINGS: LUNGS AND PLEURA: Right mid lung linear opacities. Trace right pleural effusion. HEART AND MEDIASTINUM: Atherosclerotic plaque noted. Elevated right hemidiaphragm. BONES AND SOFT TISSUES: Displaced left 4th and 5th rib fractures. IMPRESSION: 1. Displaced left 4th and 5th rib fractures, likely related to the reported fall. 2. Elevated right hemidiaphragm, right mid lung linear opacities, and trace right pleural effusion. Electronically signed by: Waddell Calk MD 12/22/2023 12:41 PM EDT RP Workstation: [id number]     Procedures   Medications Ordered in the ED - No data to display                                  Medical Decision Making Amount and/or Complexity of Data Reviewed Radiology: ordered.   This patient is a 89 y.o. female  who presents to the ED for concern of Multiple recent falls, most recent with head injury.   Differential diagnoses prior to evaluation: The emergent differential diagnosis includes, but is not limited to,  epidural hematoma, subdural hematoma, skull fracture, subarachnoid hemorrhage, unstable cervical spine fracture, concussion vs other MSK injury, other acute fractures, pneumothorax, unstable fracture, versus other. This is not an exhaustive differential.   Past Medical History /  Co-morbidities / Social History: hypertension, hyperlipidemia, osteoporosis, chronic pancreatitis  Additional history: Chart reviewed. Pertinent results include: Reviewed lab work, imaging from previous recent hospital admissions, ED evaluations  Physical Exam: Physical exam performed. The pertinent findings include: Moves all 4 limbs spontaneously, no leg length discrepancy.  No tenderness of bilateral shoulders, elbows, wrists.  Mild tenderness to palpation of hips, no tenderness of knees.  No tenderness of ankles.  Some mild tenderness to palpation in the lumbar midline spine, no step-off, deformity  Some ttp of left lateral ribcage, no flail segment or obvious deformity noted   Neurovascularly intact throughout bilateral upper and lower extremities.  Lab Tests/Imaging studies: I personally interpreted labs/imaging and the pertinent results include: Imaging including CT lumbar, CT head, CT C-spine showed slight progression of endplate deformity at L4, no acute intracranial abnormality or C-spine injury.  Degenerative changes noted on plain film pelvis.  Plain film chest shows 2 displaced ribs 4th and 5th,  no pneumothorax. I agree with the radiologist interpretation.   Medications: Encouraged Tylenol , incentive spirometry, we discussed further imaging, hospital admission, but patient overall at neurologic baseline, not in significant intractable pain, daughter would prefer return to facility, close outpatient management.  I think this is reasonable.   Disposition: After consideration of the diagnostic results and the patients response to treatment, I feel that patient stable for discharge with slight worsening of L4 compression fracture, new rib fractures.  Pain control as above, close orthopedic follow-up.SABRA   emergency department workup does not suggest an emergent condition requiring admission or immediate intervention beyond what has been performed at this time. The plan is: as above. The  patient is safe for discharge and has been instructed to return immediately for worsening symptoms, change in symptoms or any other concerns.   Final diagnoses:  Closed fracture of multiple ribs of left side, initial encounter  Compression fracture of L4 vertebra with routine healing, subsequent encounter  Skin tear of left upper arm without complication, initial encounter    ED Discharge Orders     None          Rosan Sherlean DEL, PA-C 12/22/23 1452    Francesca Elsie CROME, MD 12/23/23 1301

## 2023-12-23 DIAGNOSIS — Z9181 History of falling: Secondary | ICD-10-CM | POA: Diagnosis not present

## 2023-12-23 DIAGNOSIS — M170 Bilateral primary osteoarthritis of knee: Secondary | ICD-10-CM | POA: Diagnosis not present

## 2023-12-23 DIAGNOSIS — E559 Vitamin D deficiency, unspecified: Secondary | ICD-10-CM | POA: Diagnosis not present

## 2023-12-23 DIAGNOSIS — N182 Chronic kidney disease, stage 2 (mild): Secondary | ICD-10-CM | POA: Diagnosis not present

## 2023-12-23 DIAGNOSIS — Z87891 Personal history of nicotine dependence: Secondary | ICD-10-CM | POA: Diagnosis not present

## 2023-12-23 DIAGNOSIS — Z853 Personal history of malignant neoplasm of breast: Secondary | ICD-10-CM | POA: Diagnosis not present

## 2023-12-23 DIAGNOSIS — I129 Hypertensive chronic kidney disease with stage 1 through stage 4 chronic kidney disease, or unspecified chronic kidney disease: Secondary | ICD-10-CM | POA: Diagnosis not present

## 2023-12-23 DIAGNOSIS — H409 Unspecified glaucoma: Secondary | ICD-10-CM | POA: Diagnosis not present

## 2023-12-23 DIAGNOSIS — K8689 Other specified diseases of pancreas: Secondary | ICD-10-CM | POA: Diagnosis not present

## 2023-12-23 DIAGNOSIS — E785 Hyperlipidemia, unspecified: Secondary | ICD-10-CM | POA: Diagnosis not present

## 2023-12-23 DIAGNOSIS — E039 Hypothyroidism, unspecified: Secondary | ICD-10-CM | POA: Diagnosis not present

## 2024-02-08 DEATH — deceased
